# Patient Record
Sex: Male | Born: 1970 | Race: White | Hispanic: No | Marital: Married | Smoking: Never smoker
Health system: Southern US, Community
[De-identification: ages and names within clinical notes are randomized; demographics above are authoritative.]

## PROBLEM LIST (undated history)

## (undated) DIAGNOSIS — J45909 Unspecified asthma, uncomplicated: Secondary | ICD-10-CM

## (undated) DIAGNOSIS — J309 Allergic rhinitis, unspecified: Secondary | ICD-10-CM

## (undated) DIAGNOSIS — N529 Male erectile dysfunction, unspecified: Secondary | ICD-10-CM

## (undated) DIAGNOSIS — B351 Tinea unguium: Secondary | ICD-10-CM

## (undated) DIAGNOSIS — K589 Irritable bowel syndrome without diarrhea: Secondary | ICD-10-CM

## (undated) HISTORY — DX: Allergic rhinitis, unspecified: J30.9

## (undated) HISTORY — PX: SHOULDER SURGERY: SHX246

## (undated) HISTORY — DX: Male erectile dysfunction, unspecified: N52.9

## (undated) HISTORY — PX: TONSILLECTOMY: SUR1361

## (undated) HISTORY — PX: VASECTOMY: SHX75

## (undated) HISTORY — PX: COLONOSCOPY: SHX174

## (undated) HISTORY — DX: Irritable bowel syndrome, unspecified: K58.9

## (undated) HISTORY — DX: Unspecified asthma, uncomplicated: J45.909

## (undated) HISTORY — DX: Tinea unguium: B35.1

## (undated) HISTORY — PX: SEPTOPLASTY: SHX2393

---

## 2011-10-26 ENCOUNTER — Encounter: Payer: Self-pay | Admitting: Family Medicine

## 2011-10-26 ENCOUNTER — Ambulatory Visit (INDEPENDENT_AMBULATORY_CARE_PROVIDER_SITE_OTHER): Payer: 59 | Admitting: Family Medicine

## 2011-10-26 VITALS — BP 110/70 | HR 56 | Ht 70.0 in | Wt 177.0 lb

## 2011-10-26 DIAGNOSIS — B351 Tinea unguium: Secondary | ICD-10-CM | POA: Insufficient documentation

## 2011-10-26 DIAGNOSIS — Z79899 Other long term (current) drug therapy: Secondary | ICD-10-CM

## 2011-10-26 LAB — HEPATIC FUNCTION PANEL
ALT: 14 U/L (ref 0–53)
AST: 18 U/L (ref 0–37)
Alkaline Phosphatase: 54 U/L (ref 39–117)
Bilirubin, Direct: 0.1 mg/dL (ref 0.0–0.3)
Indirect Bilirubin: 0.6 mg/dL (ref 0.0–0.9)

## 2011-10-26 MED ORDER — TERBINAFINE HCL 250 MG PO TABS: 250.0000 mg | ORAL_TABLET | Freq: Every day | ORAL | Status: DC

## 2011-10-26 NOTE — Patient Instructions (Addendum)
If medication is approved, take it daily for three months. Remember it may take a full year for the great toenail to grow back in normally (other nails take less time).  If we need a fungal culture to get insurance to authorize the coverage, then let the toenails grow out and return for a clipping.  Do not start the medication until you hear that your liver tests are normal.  With each refill of the medication, you need to return for liver tests

## 2011-10-26 NOTE — Progress Notes (Signed)
Chief complaint:  Nail Problem-- L great toe x 6-8 months also starting to notice on all of his other toes as well  Over 6 months ago, noticed that the toenails looked very white, thickened. First noticed on his left foot, but now is spreading to the right.  Left great toe is the worst.  Took Lamisil about 5 years ago for a similar problem, and nails improved completely.  Seemed to start getting worse again over the last 6 months.  Requesting re-treatment.  He denies any pain, ingrowing nails, or other concerns  Past Medical History  Diagnosis Date  . Allergic rhinitis, cause unspecified     Past Surgical History  Procedure Date  . Appendectomy age 24  . Shoulder surgery     right    History   Social History  . Marital Status: Married    Spouse Name: N/A    Number of Children: 3  . Years of Education: N/A   Occupational History  . Corporate treasurer at [employer]    Social History Main Topics  . Smoking status: Never Smoker   . Smokeless tobacco: Never Used  . Alcohol Use: Yes     2-4 drinks per week.  . Drug Use: No  . Sexually Active: Not on file   Other Topics Concern  . Not on file   Social History Narrative   Lives with wife, 3 kids, 1 dog.  He is a runner.  Moved here from [facility]    Family History  Problem Relation Age of Onset  . Cancer Mother     lung cancer, smoker  . Alcohol abuse Mother   . Cancer Father     stomach cancer, smoker  . Alcohol abuse Father   . Asthma Daughter    Current outpatient prescriptions:ciclesonide (ALVESCO) 160 MCG/ACT inhaler, Inhale 1 puff into the lungs 2 (two) times daily.  , Disp: , Rfl: ;  mometasone (NASONEX) 50 MCG/ACT nasal spray, Place 2 sprays into the nose 2 (two) times daily.  , Disp: , Rfl: ;  montelukast (SINGULAIR) 10 MG tablet, Take 10 mg by mouth at bedtime.  , Disp: , Rfl:  terbinafine (LAMISIL) 250 MG tablet, Take 1 tablet (250 mg total) by mouth daily., Disp: 30 tablet, Rfl: 2  Allergies    Allergen Reactions  . Aspirin Other (See Comments)    Not actual allergy, had chronic nosebleeds as a child and was instructed not to use aspirin.   ROS:  Denies fevers, URI symptoms.  Recent flu, lots of problems with allergies in [place]--he is back on meds, doing well.  Denies cough, shortness of breath, GI complaints, joint pains, skin concerns or other problems  PHYSICAL EXAM: BP 110/70  Pulse 56  Ht 5\' 10"  (1.778 m)  Wt 177 lb (80.287 kg)  BMI 25.40 kg/m2 Well developed, pleasant male in no distress Neck: no lymphadenopathy, thyromegaly or mass Heart: bradycardic, regular rhythm, no murmurs, rubs or gallops Lungs: clear bilaterally Abdomen: soft, nontender, no organomegaly or mass Extremities: 2+ pulses, no edema.  Toenails: White discoloration to L 1,4,5 toenails, with the great toenail slightly thickened as well.  White discoloration also to R 3,4,5  Toenails.  No evidence of ingrowing nails or paronychia Skin: no rashes Psych: normal mood, affect, hygiene and grooming  ASSESSMENT/PLAN: 1. Onychomycosis  terbinafine (LAMISIL) 250 MG tablet  2. Encounter for long-term (current) use of other medications  Hepatic function panel, Hepatic function panel, Hepatic function panel  Risks and expected course of treatment with antifungals reviewed.  He would like re-treatment.  Baseline LFT's today, then repeat monthly while on therapy.  Treat x 3 months total with antifungals, and pt is aware that it may take a year for great toenail to grow in normal.  We discussed the possibility of issues with insurance coverage. Nails were too short to clip for fungal culture today.  If it is required by insurance, he will need to let nail grow a little longer, then come for clipping for fungal culture (only if required for prior auth for Lamisil, otherwise just treat without culture)  Allergies--he isn't under the care of an allergist here.  He has had skin testing and has multiple allergies.  If he is  well controlled on meds, I'm happy to rx his current meds for him.  If not well controlled, then he should see allergist for treatment, to consider allergy shots

## 2011-10-27 ENCOUNTER — Telehealth: Payer: Self-pay | Admitting: Family Medicine

## 2011-10-27 NOTE — Telephone Encounter (Signed)
DONE

## 2011-11-10 ENCOUNTER — Other Ambulatory Visit: Payer: Self-pay | Admitting: Allergy

## 2011-11-10 ENCOUNTER — Ambulatory Visit
Admission: RE | Admit: 2011-11-10 | Discharge: 2011-11-10 | Disposition: A | Discharge status: Home / Self Care | Payer: 59 | Source: Ambulatory Visit | Attending: Allergy | Admitting: Allergy

## 2011-11-10 DIAGNOSIS — J45909 Unspecified asthma, uncomplicated: Secondary | ICD-10-CM

## 2011-11-27 ENCOUNTER — Other Ambulatory Visit: Payer: 59

## 2011-11-27 DIAGNOSIS — Z79899 Other long term (current) drug therapy: Secondary | ICD-10-CM

## 2011-11-28 LAB — HEPATIC FUNCTION PANEL
ALT: 16 U/L (ref 0–53)
AST: 21 U/L (ref 0–37)
Bilirubin, Direct: 0.1 mg/dL (ref 0.0–0.3)
Total Protein: 6.9 g/dL (ref 6.0–8.3)

## 2011-11-29 ENCOUNTER — Other Ambulatory Visit: Payer: Self-pay | Admitting: *Deleted

## 2011-11-29 DIAGNOSIS — Z79899 Other long term (current) drug therapy: Secondary | ICD-10-CM

## 2011-12-29 ENCOUNTER — Other Ambulatory Visit: Payer: 59

## 2011-12-29 DIAGNOSIS — Z79899 Other long term (current) drug therapy: Secondary | ICD-10-CM

## 2011-12-30 LAB — HEPATIC FUNCTION PANEL
ALT: 29 U/L (ref 0–53)
AST: 37 U/L (ref 0–37)
Albumin: 4.4 g/dL (ref 3.5–5.2)
Total Protein: 6.7 g/dL (ref 6.0–8.3)

## 2011-12-31 ENCOUNTER — Encounter: Payer: Self-pay | Admitting: Family Medicine

## 2012-09-12 ENCOUNTER — Other Ambulatory Visit (INDEPENDENT_AMBULATORY_CARE_PROVIDER_SITE_OTHER): Payer: 59

## 2012-09-12 DIAGNOSIS — Z23 Encounter for immunization: Secondary | ICD-10-CM

## 2012-09-25 ENCOUNTER — Encounter: Payer: Self-pay | Admitting: Family Medicine

## 2012-09-25 ENCOUNTER — Ambulatory Visit (INDEPENDENT_AMBULATORY_CARE_PROVIDER_SITE_OTHER): Payer: 59 | Admitting: Family Medicine

## 2012-09-25 VITALS — BP 110/72 | HR 52 | Temp 98.3°F | Ht 70.0 in | Wt 186.0 lb

## 2012-09-25 DIAGNOSIS — H9209 Otalgia, unspecified ear: Secondary | ICD-10-CM

## 2012-09-25 NOTE — Progress Notes (Signed)
Chief Complaint  Patient presents with  . Otalgia    left ear x a couple days-pain has subsided. Last month felt plugged.  Feels plugged now and does have a sharp pain occasionally.   Last month ear felt plugged, but symptoms eventually resolved.  Sunday, while doing the [employer]ud[employer]--had acute onset of sharp pain in left ear after jumping into cold muddy water from a height of 20 feet (into 15 ft deep water, didn't touch bottom).  Felt very nauseated, ear felt plugged, throbbing, pain radiated to his left jaw/chin area for the rest of the night. He had some tinnitus and nausea at that time. He was able to continue and complete the event.  That night, he used a heating pad, Aleve D and ibuprofen after getting back home.  Pain is improved, but still plugged.  Occasional sharp pains.   Denies any ear drainage, fevers, and some decrease in hearing.  Feels plugged, but unable to pop the ear.  Past Medical History  Diagnosis Date  . Allergic rhinitis, cause unspecified    Past Surgical History  Procedure Date  . Appendectomy age 1  . Shoulder surgery     right   History   Social History  . Marital Status: Married    Spouse Name: N/A    Number of Children: 3  . Years of Education: N/A   Occupational History  . Corporate treasurer at [employer]    Social History Main Topics  . Smoking status: Never Smoker   . Smokeless tobacco: Never Used  . Alcohol Use: Yes     Comment: 2-4 drinks per week.  . Drug Use: No  . Sexually Active: Not on file   Other Topics Concern  . Not on file   Social History Narrative   Lives with wife, 3 kids, 1 dog.  He is a runner.  Moved here from [facility]   Current outpatient prescriptions:budesonide-formoterol (SYMBICORT) 160-4.5 MCG/ACT inhaler, Inhale 2 puffs into the lungs 2 (two) times daily., Disp: , Rfl: ;  mometasone (NASONEX) 50 MCG/ACT nasal spray, Place 2 sprays into the nose 2 (two) times daily.  , Disp: , Rfl: ;  montelukast  (SINGULAIR) 10 MG tablet, Take 10 mg by mouth at bedtime.  , Disp: , Rfl: ;  ALBUTEROL SULFATE IN, Inhale 1-2 puffs into the lungs as needed., Disp: , Rfl:   Allergies  Allergen Reactions  . Aspirin Other (See Comments)    Not actual allergy, had chronic nosebleeds as a child and was instructed not to use aspirin.   ROS:  Denies fevers.  +minimal congestion from allergies.  No sore throat, cough, shortness of breath, vertigo or other dizziness.  Denies nausea, vomiting, diarrhea, or other concerns.  PHYSICAL EXAM: BP 110/72  Pulse 52  Temp 98.3 F (36.8 C) (Oral)  Ht 5\' 10"  (1.778 m)  Wt 186 lb (84.369 kg)  BMI 26.69 kg/m2 Well developed, pleasant male in no distress HEENT:  PERRL, EOMI, conjunctiva clear.  TM's and EAC's normal.  Sinuses nontender,  OP clear. Neck: no lymphadenopathy, thyromegaly or carotid bruit Heart: reglar rate and rhythm without murmur Lungs: clear bilaterally Abdomen: soft, nontender, no organomegaly or mass. Skin: no rashes/lesions.   ASSESSMENT/PLAN: 1. Otalgia    left ear.  likely related to barotrauma.  supportive measures reviewed.  expect gradual continued resolution.     Reassured no evidence of acute infection.  If has ongoing symtpoms of pain, decreased hearing, then will need referral  to ENT for full evaluation.

## 2012-09-25 NOTE — Patient Instructions (Signed)
Ear Barotrauma   Ear barotrauma is ear pain or damage caused by a pressure difference between the inside and outside of the eardrum.    CAUSES     Being too close to an explosion or blast.   Receiving a blow to your ear.   Coming to the surface too rapidly after scuba diving.   Flying in an airplane.   Going to high altitudes within 24 hours after diving.   Undergoing rapid depressurization after working in a pressurized chamber.  SYMPTOMS     Dizziness that feels like a spinning, rocking, or tumbling sensation.   Loss of balance.   Nausea.   Ringing in your ear(s) (tinnitus).   Ear pain.   Bleeding from the ear(s).   Sudden partial or complete loss of hearing.   Headache.  DIAGNOSIS   Your caregiver may know what is wrong based on your history. Your caregiver may also use blood work, X-rays, and other tests to make sure that other injuries did not happen. This may be more important if your injury happened while scuba diving or if you traveled to high altitude after diving.  TREATMENT    Treatment depends on how bad the injury to your ear(s) is. For example, if there is a risk for infection, antibiotic medicine may be recommended. Your caregiver will make these decisions based on what was found during your exam. With severe injuries, you may need to see a specialist.  HOME CARE INSTRUCTIONS     Do not travel to high altitudes, work in a pressurized cabin or room, or scuba dive until your caregiver approves.   Keep your ears dry. Use the corner of a towel to get water out of the ears after showering or bathing.   Only take over-the-counter or prescription medicines for pain, discomfort, or fever as directed by your caregiver.   See your caregiver as recommended.  SEEK MEDICAL CARE IF:     You have pain that is not relieved by medicine or is getting worse.   You have a fever.   You have any new or unusual discharge from the ear.    The outer ear becomes red or swollen or there is swelling behind your earlobe.   You have problems that may be related to the medicine you are taking.  MAKE SURE YOU:     Understand these instructions.   Will watch your condition.   Will get help right away if you are not doing well or get worse.  Document Released: 10/09/2006 Document Revised: 01/29/2012 Document Reviewed: 10/25/2007  Exit[employer] Patient Information 2013 [employer].

## 2013-05-16 ENCOUNTER — Encounter: Payer: Self-pay | Admitting: Medical

## 2013-05-16 ENCOUNTER — Ambulatory Visit (INDEPENDENT_AMBULATORY_CARE_PROVIDER_SITE_OTHER): Payer: 59 | Admitting: Medical

## 2013-05-16 VITALS — BP 112/70 | HR 80 | Temp 99.4°F | Resp 18 | Wt 195.0 lb

## 2013-05-16 DIAGNOSIS — H9202 Otalgia, left ear: Secondary | ICD-10-CM

## 2013-05-16 DIAGNOSIS — J069 Acute upper respiratory infection, unspecified: Secondary | ICD-10-CM

## 2013-05-16 DIAGNOSIS — J45909 Unspecified asthma, uncomplicated: Secondary | ICD-10-CM

## 2013-05-16 DIAGNOSIS — H9209 Otalgia, unspecified ear: Secondary | ICD-10-CM

## 2013-05-16 MED ORDER — AMOXICILLIN 875 MG PO TABS: 875.0000 mg | ORAL_TABLET | Freq: Two times a day (BID) | ORAL | Status: DC

## 2013-05-16 MED ORDER — ALBUTEROL SULFATE HFA 108 (90 BASE) MCG/ACT IN AERS: 2.0000 | INHALATION_SPRAY | Freq: Four times a day (QID) | RESPIRATORY_TRACT | Status: DC | PRN

## 2013-05-16 NOTE — Progress Notes (Signed)
Subjective:  Seth Kerr is a 42 y.o. male who presents for illness x 5 days.   Started with sore throat, but has progressively felt worse. Has cough, tight chest, ear pain, nose clogged.  He notes subjective fever, achy.  Denies NVD.  Uses Symbiotic, 2 puffs BID, hasn't used albuterol in months, hasn't needed it.  Treatment to date: cough suppressants, old left over Hydrocodone/Hycodan.  Daughter has had some cough.  No other aggravating or relieving factors. He is rarely sick, rarely has to use his inhaler.  Inhaler is out of date, request refill. No other c/o.  The following portions of the patient's history were reviewed and updated as appropriate: allergies, current medications, past family history, past medical history, past social history, past surgical history and problem list.  Past Medical History  Diagnosis Date  . Allergic rhinitis, cause unspecified   . Asthma    ROS as in subjective  Objective: Vital signs reviewed  General appearance: Alert, WD/WN, no distress, ill appearing                             Skin: warm, no rash, no diaphoresis                           Head: no sinus tenderness                            Eyes: conjunctiva normal, corneas clear, PERRLA                            Ears: left TM with slight erythema, serous effusion, left TM pearly, external ear canals normal                          Nose: septum midline, turbinates swollen, with erythema and clear discharge             Mouth/throat: MMM, tongue normal, mild pharyngeal erythema                           Neck: supple, no adenopathy, no thyromegaly, nontender                          Heart: RRR, normal S1, S2, no murmurs                         Lungs: clear, no wheezes, no rales                Extremities: no edema, nontender     Assessment and Plan: Encounter Diagnoses  Name Primary?  Marland Kitchen Upper respiratory infection Yes  . Unspecified asthma(493.90)   . Otalgia of left ear    Currently this may  just be viral URI, but left ear with effusion, slight erythema.   Discussed supportive care, but if worsening in next 1-2 days with fever, worse symptoms, worse ear pain, then begin Amoxicillin sent.  Refilled albuterol.  Call/return in 2-3 days if symptoms are worse or not improving.

## 2013-10-09 ENCOUNTER — Other Ambulatory Visit (INDEPENDENT_AMBULATORY_CARE_PROVIDER_SITE_OTHER): Payer: 59

## 2013-10-09 DIAGNOSIS — Z23 Encounter for immunization: Secondary | ICD-10-CM

## 2013-11-25 ENCOUNTER — Ambulatory Visit: Payer: 59 | Admitting: Family Medicine

## 2013-12-08 ENCOUNTER — Encounter: Payer: Self-pay | Admitting: Family Medicine

## 2013-12-08 ENCOUNTER — Ambulatory Visit (INDEPENDENT_AMBULATORY_CARE_PROVIDER_SITE_OTHER): Payer: 59 | Admitting: Family Medicine

## 2013-12-08 VITALS — BP 120/78 | HR 68 | Ht 70.0 in | Wt 196.0 lb

## 2013-12-08 DIAGNOSIS — Z79899 Other long term (current) drug therapy: Secondary | ICD-10-CM

## 2013-12-08 DIAGNOSIS — B351 Tinea unguium: Secondary | ICD-10-CM

## 2013-12-08 DIAGNOSIS — N529 Male erectile dysfunction, unspecified: Secondary | ICD-10-CM

## 2013-12-08 MED ORDER — TERBINAFINE HCL 250 MG PO TABS: 250.0000 mg | ORAL_TABLET | Freq: Every day | ORAL | Status: DC

## 2013-12-08 MED ORDER — SILDENAFIL CITRATE 100 MG PO TABS: 50.0000 mg | ORAL_TABLET | Freq: Every day | ORAL | Status: DC | PRN

## 2013-12-08 NOTE — Patient Instructions (Signed)

## 2013-12-08 NOTE — Progress Notes (Signed)
Chief Complaint  Patient presents with  . Nail Problem    seen in 10-2011 by Dr.Chosen Geske for this, did get better. Has some toemail fungus on both feet, several nails would like to possibly restart Lamisil. Also has a rash in his groin area x 1 month. Did try mometasone ointment given by Dr.Van WInkle(allergist) not working at all.    Patient presents requesting another course of treatment for onychomycosis.  He was treated for this in 10/2011-- with lamisil x 3 months and reports that the infection completely resolved.  He started noticing recurrence of infection in the left 4th toe in the early part of summer 2014, and has since continued to spread to other toes.  Denies any ingrowing nails or pain.  Rash in bilateral groin for about a month.  He sometimes would get this when he starts running, but he has been consistently running and it isn't getting better.  He has been using steroid cream without benefit.  Rash is at groin bilaterally, and it very itchy.  He thinks he has a spider bite on his left thigh.    ED--used Viagra in past.  Having some intermittent problems maintaining erection.  Asking for prescription.  Had prescription for years, but only recently having more problems  20 pound weight gain in 2 years.  Eating better, cut out alcohol since the beginning of the year.  He thinks some of the weight gain has been related to his schedule, harder to run outside in his dark neighborhood with [place] compared to when he lived in [place].  Past Medical History  Diagnosis Date  . Allergic rhinitis, cause unspecified   . Asthma   . Erectile dysfunction   . Onychomycosis    Past Surgical History  Procedure Laterality Date  . Appendectomy  age 43  . Shoulder surgery      right   History   Social History  . Marital Status: Married    Spouse Name: N/A    Number of Children: 3  . Years of Education: N/A   Occupational History  . Curator at [employer]  . Smoking status: Never Smoker   . Smokeless tobacco: Never Used  . Alcohol Use: Yes     Comment: 2-4 drinks per week. No alcohol since 11/20/13  . Drug Use: No  . Sexual Activity: Not on file   Other Topics Concern  . Not on file   Social History Narrative   Lives with wife, 3 kids, 1 dog.  He is a runner.  Moved here from [facility].  Curator, works 12 hour shifts.    Current outpatient prescriptions:budesonide-formoterol (SYMBICORT) 160-4.5 MCG/ACT inhaler, Inhale 2 puffs into the lungs 2 (two) times daily., Disp: , Rfl: ;  levocetirizine (XYZAL) 5 MG tablet, Take 5 mg by mouth every evening., Disp: , Rfl: ;  mometasone (ELOCON) 0.1 % ointment, Apply 1 application topically daily., Disp: , Rfl: ;  montelukast (SINGULAIR) 10 MG tablet, Take 10 mg by mouth at bedtime.  , Disp: , Rfl:  albuterol (PROVENTIL HFA;VENTOLIN HFA) 108 (90 BASE) MCG/ACT inhaler, Inhale 2 puffs into the lungs every 6 (six) hours as needed for wheezing., Disp: 1 Inhaler, Rfl: 0;  mometasone (NASONEX) 50 MCG/ACT nasal spray, Place 2 sprays into the nose 2 (two) times daily.  , Disp: , Rfl:   Allergies  Allergen Reactions  . Aspirin Other (See Comments)    Not actual  allergy, had chronic nosebleeds as a child and was instructed not to use aspirin.   ROS:  Denies fevers, chills, URI symptoms.  His allergies/asthma is well controlled, he is under the care of Dr. Donneta Romberg.  No chest pain, shortness of breath.  No GI complaints.  No other skin lesions except as per HPI.  No chest pain or other problems.  PHYSICAL EXAM: BP 120/78  Pulse 68  Ht 5\' 10"  (1.778 m)  Wt 196 lb (88.905 kg)  BMI 28.12 kg/m2 Well developed, pleasant male in no distress  Bilateral groin--erythema, somewhat patchy and diffuse.  Not macerated, no satellite lesions, but one area appeared mildly serpiginous.   1 scab on thigh, without evidence of abscess Toenails: 2nd through 5th toes bilaterally are discolored, the L  4th toenail is completely white 2+ pulses.  No maceration between toes  Neuro: alert and oriented. Normal sensation, strength, gait Psych: normal mood, affect, hygiene and grooming  ASSESSMENT/PLAN:   Onychomycosis - Plan: terbinafine (LAMISIL) 250 MG tablet, Hepatic function panel  Encounter for long-term (current) use of other medications - Plan: Hepatic function panel  Erectile dysfunction - Plan: sildenafil (VIAGRA) 100 MG tablet  Risks/benefits and side effects of meds reviewed. Rash in groin--cannot rule out tinea cruris (exam affected by partial treatment with steroids), but this would be covered with the lamisil for his toenails, so no additional topical treatment is needed.  He can continue to use the steroid cream as needed for itching (while on lamisil), and educated re: measures to keep the skin dry (loose fitting clothing, cotton underwear, dry completely, powder, etc.)  Return in 6 weeks for LFT's. Schedule CPE

## 2014-01-19 ENCOUNTER — Telehealth: Payer: Self-pay | Admitting: Family Medicine

## 2014-01-19 ENCOUNTER — Other Ambulatory Visit: Payer: 59

## 2014-01-19 NOTE — Telephone Encounter (Signed)
Left message for pt to call. Pt needs a lab visit missed his last one.

## 2014-01-28 ENCOUNTER — Other Ambulatory Visit: Payer: 59

## 2014-01-28 DIAGNOSIS — Z79899 Other long term (current) drug therapy: Secondary | ICD-10-CM

## 2014-01-28 DIAGNOSIS — B351 Tinea unguium: Secondary | ICD-10-CM

## 2014-01-28 LAB — HEPATIC FUNCTION PANEL
ALT: 19 U/L (ref 0–53)
AST: 21 U/L (ref 0–37)
Albumin: 4 g/dL (ref 3.5–5.2)
Alkaline Phosphatase: 43 U/L (ref 39–117)
BILIRUBIN DIRECT: 0.2 mg/dL (ref 0.0–0.3)
Indirect Bilirubin: 0.6 mg/dL (ref 0.2–1.2)
TOTAL PROTEIN: 6.5 g/dL (ref 6.0–8.3)
Total Bilirubin: 0.8 mg/dL (ref 0.2–1.2)

## 2014-04-22 ENCOUNTER — Encounter: Payer: Self-pay | Admitting: Family Medicine

## 2014-04-22 ENCOUNTER — Other Ambulatory Visit: Payer: Self-pay | Admitting: Allergy

## 2014-04-22 ENCOUNTER — Ambulatory Visit (INDEPENDENT_AMBULATORY_CARE_PROVIDER_SITE_OTHER): Payer: 59 | Admitting: Family Medicine

## 2014-04-22 ENCOUNTER — Ambulatory Visit
Admission: RE | Admit: 2014-04-22 | Discharge: 2014-04-22 | Disposition: A | Discharge status: Home / Self Care | Payer: 59 | Source: Ambulatory Visit | Attending: Allergy | Admitting: Allergy

## 2014-04-22 VITALS — BP 112/78 | HR 52 | Ht 70.0 in | Wt 190.0 lb

## 2014-04-22 DIAGNOSIS — J45901 Unspecified asthma with (acute) exacerbation: Secondary | ICD-10-CM

## 2014-04-22 DIAGNOSIS — R5381 Other malaise: Secondary | ICD-10-CM

## 2014-04-22 DIAGNOSIS — Z23 Encounter for immunization: Secondary | ICD-10-CM

## 2014-04-22 DIAGNOSIS — Z13220 Encounter for screening for lipoid disorders: Secondary | ICD-10-CM

## 2014-04-22 DIAGNOSIS — J309 Allergic rhinitis, unspecified: Secondary | ICD-10-CM

## 2014-04-22 DIAGNOSIS — L84 Corns and callosities: Secondary | ICD-10-CM

## 2014-04-22 DIAGNOSIS — R5383 Other fatigue: Secondary | ICD-10-CM

## 2014-04-22 DIAGNOSIS — J45909 Unspecified asthma, uncomplicated: Secondary | ICD-10-CM

## 2014-04-22 DIAGNOSIS — Z0000 Encounter for general adult medical examination without abnormal findings: Secondary | ICD-10-CM

## 2014-04-22 LAB — POCT URINALYSIS DIPSTICK
Bilirubin, UA: NEGATIVE
Blood, UA: NEGATIVE
Glucose, UA: NEGATIVE
KETONES UA: NEGATIVE
Leukocytes, UA: NEGATIVE
Nitrite, UA: NEGATIVE
PH UA: 5
PROTEIN UA: NEGATIVE
SPEC GRAV UA: 1.01
UROBILINOGEN UA: NEGATIVE

## 2014-04-22 NOTE — Progress Notes (Signed)
Chief Complaint  Patient presents with  . Annual Exam    nonfasting annual exam. Would like to follow up on toenail fungus-completed lamisil but does have a place on the bottom of his foot he would like for you to look at. No other concerns.    Seth Kerr is a 43 y.o. male who presents for a complete physical.  He has the following concerns:  He notices a lump on the bottom of his right foot, slowly developing since March, and now it has become painful.  He thinks it started after being more active after getting a Fitbit. He hasn't actually been able to run much recently, due to schedule change at work.  He saw Dr. Donneta Romberg this morning, who gave him a steroid shot due to his complaints of increasing cough, and decreased peak flow.  He is also going for a chest x-ray today.  Denies fevers or chills, no productive cough.  He is getting weekly allergy shots, and allergies are controlled with his current regimen.  Onychomycosis:  He completed the course of lamisil without adverse effects.  He notices that the discoloration seems to be growing out, normal nail color for new growth.   Immunization History  Administered Date(s) Administered  . Influenza Split 09/12/2012  . Influenza,inj,Quad PF,36+ Mos 10/09/2013   Last colonoscopy: done at approx age [age], and another one age 44.  Reportedly normal.  Diagnosed with IBS Last PSA: reports having had one done at age 11 (through work; given choice for blood test or exam, but ultimately ended up having prostate exam for some urinary complaints) Dentist: twice yearly Ophtho: yearly Exercise:  Has been less since some change in job schedule (12 hrs shifts are changing from days to nights; going back to straight days in August).  Runs, and weights at the gym (2-4x/wk)  Past Medical History  Diagnosis Date  . Allergic rhinitis, cause unspecified     on immunotherapy, per Dr. Donneta Romberg  . Asthma   . Erectile dysfunction   . Onychomycosis     s/p  lamisil x 2 courses  . Irritable bowel syndrome (IBS)     has had 2 colonoscopies    Past Surgical History  Procedure Laterality Date  . Appendectomy  age 29  . Shoulder surgery Right   . Septoplasty  approx age 32    History   Social History  . Marital Status: Married    Spouse Name: N/A    Number of Children: 3  . Years of Education: N/A   Occupational History  . Curator at [employer]  . Smoking status: Never Smoker   . Smokeless tobacco: Never Used  . Alcohol Use: Yes     Comment: 4-5 drinks//week (no more than 2 drinks in a night)  . Drug Use: No  . Sexual Activity: Yes    Partners: Female   Other Topics Concern  . Not on file   Social History Narrative   Lives with wife, 3 kids, 1 dog.  He is a runner.  Moved here from [facility].  Curator, works 12 hour shifts.     Family History  Problem Relation Age of Onset  . Cancer Mother     lung cancer, smoker  . Alcohol abuse Mother   . Cancer Father     stomach cancer, smoker  . Alcohol abuse Father   . Asthma Daughter   . Diabetes Neg Hx  Outpatient Encounter Prescriptions as of 04/22/2014  Medication Sig Note  . levocetirizine (XYZAL) 5 MG tablet Take 5 mg by mouth every evening.   . mometasone (ELOCON) 0.1 % ointment Apply 1 application topically daily.   . mometasone (NASONEX) 50 MCG/ACT nasal spray Place 2 sprays into the nose 2 (two) times daily.     . mometasone-formoterol (DULERA) 100-5 MCG/ACT AERO Inhale 2 puffs into the lungs 2 (two) times daily.   . montelukast (SINGULAIR) 10 MG tablet Take 10 mg by mouth at bedtime.     Marland Kitchen albuterol (PROVENTIL HFA;VENTOLIN HFA) 108 (90 BASE) MCG/ACT inhaler Inhale 2 puffs into the lungs every 6 (six) hours as needed for wheezing. 04/22/2014: Uses prn, hasn't been using  . sildenafil (VIAGRA) 100 MG tablet Take 0.5-1 tablets (50-100 mg total) by mouth daily as needed for erectile dysfunction. 04/22/2014: Uses  sporadically  . [DISCONTINUED] budesonide-formoterol (SYMBICORT) 160-4.5 MCG/ACT inhaler Inhale 2 puffs into the lungs 2 (two) times daily.   . [DISCONTINUED] terbinafine (LAMISIL) 250 MG tablet Take 1 tablet (250 mg total) by mouth daily.    Allergies  Allergen Reactions  . Aspirin Other (See Comments)    Not actual allergy, had chronic nosebleeds as a child and was instructed not to use aspirin.   ROS:  The patient denies anorexia, fever, weight changes, headaches,  vision loss, decreased hearing, ear pain, hoarseness, chest pain, palpitations, dizziness, syncope, dyspnea on exertion, cough, swelling, nausea, vomiting, diarrhea, constipation, abdominal pain, melena, hematochezia, indigestion/heartburn, hematuria, incontinence, nocturia, weakened urine stream, dysuria, genital lesions, joint pains, numbness, tingling, weakness, tremor, suspicious skin lesions, depression, anxiety, abnormal bleeding/bruising, or enlarged lymph nodes He complains of some fatigue, feeling less energy than in the past.  ED intermittent/mild Cough. Painful lump on right foot  PHYSICAL EXAM: BP 112/78  Pulse 52  Ht 5\' 10"  (1.778 m)  Wt 190 lb (86.183 kg)  BMI 27.26 kg/m2  General Appearance:    Alert, cooperative, no distress, appears stated age  Head:    Normocephalic, without obvious abnormality, atraumatic  Eyes:    PERRL, conjunctiva/corneas clear, EOM's intact, fundi    benign  Ears:    Normal TM's and external ear canals  Nose:   Nares normal, mucosa is mod edematous, clear drainage on right; normal on left. Sinuses nontender  Throat:   Lips, mucosa, and tongue normal; teeth and gums normal.  There is some erythema noted posteriorly in OP  Neck:   Supple, no lymphadenopathy;  thyroid:  no   enlargement/tenderness/nodules; no carotid   bruit or JVD  Back:    Spine nontender, no curvature, ROM normal, no CVA     tenderness  Lungs:     respirations unlabored, good air movement. No wheezing.  Some coarse  breath sounds initially that cleared.  Had some crackles that persisted at the R base  Chest Wall:    No tenderness or deformity   Heart:    Regular rate and rhythm, S1 and S2 normal, no murmur, rub   or gallop  Breast Exam:    No chest wall tenderness, masses or gynecomastia  Abdomen:     Soft, non-tender, nondistended, normoactive bowel sounds,    no masses, no hepatosplenomegaly  Genitalia:    Normal male external genitalia without lesions.  Testicles without masses.  No inguinal hernias.  Rectal:    Normal sphincter tone, no masses or tenderness; guaiac negative stool.  Prostate smooth, no nodules, not enlarged.  Extremities:   No clubbing, cyanosis  or edema. Bottom of R foot, at base of 4th toe there is a corn (hard core is palpable, tender).  There is also some callous formation.  Pulses:   2+ and symmetric all extremities  Skin:   Skin color, texture, turgor normal, no rashes or lesions.  There is at least 54mm of normal nail growth at base of infected toenails  Lymph nodes:   Cervical, supraclavicular, and axillary nodes normal  Neurologic:   CNII-XII intact, normal strength, sensation and gait; reflexes 2+ and symmetric throughout          Psych:   Normal mood, affect, hygiene and grooming.     ASSESSMENT/PLAN:  Routine general medical examination at a health care facility - Plan: Visual acuity screening, Urinalysis Dipstick, CBC with Differential, Basic metabolic panel, TSH, Vit D  25 hydroxy (rtn osteoporosis monitoring), Lipid panel  Screening for lipoid disorders - Plan: Lipid panel  Other malaise and fatigue - Plan: CBC with Differential, Basic metabolic panel, TSH, Vit D  25 hydroxy (rtn osteoporosis monitoring)  Need for Tdap vaccination - Plan: Tdap vaccine greater than or equal to 7yo IM  Asthma - controlled on current regimen.  Some increased cough and crackles R base--agree with recommendation for CXR today  Allergic rhinitis, cause unspecified - suboptimally  controlled per exam today.  Steroid shot given today will help, but recommend restarting nasal steroids  Corn of foot - discussed pads, OTC meds.  return here (or podiatrist) if ongoing pain/rproblems  OTC corn treatments.  Return here for additional treatment if not improving, vs podiatrist.  Onychomycosis--responding to treatment; nail growing in normally.  return fasting for labs

## 2014-04-22 NOTE — Patient Instructions (Addendum)
HEALTH MAINTENANCE RECOMMENDATIONS:  It is recommended that you get at least 30 minutes of aerobic exercise at least 5 days/week (for weight loss, you may need as much as 60-90 minutes). This can be any activity that gets your heart rate up. This can be divided in 10-15 minute intervals if needed, but try and build up your endurance at least once a week.  Weight bearing exercise is also recommended twice weekly.  Eat a healthy diet with lots of vegetables, fruits and fiber.  "Colorful" foods have a lot of vitamins (ie green vegetables, tomatoes, red peppers, etc).  Limit sweet tea, regular sodas and alcoholic beverages, all of which has a lot of calories and sugar.  Up to 2 alcoholic drinks daily may be beneficial for men (unless trying to lose weight, watch sugars).  Drink a lot of water.  Sunscreen of at least SPF 30 should be used on all sun-exposed parts of the skin when outside between the hours of 10 am and 4 pm (not just when at beach or pool, but even with exercise, golf, tennis, and yard work!)  Use a sunscreen that says "broad spectrum" so it covers both UVA and UVB rays, and make sure to reapply every 1-2 hours.  Remember to change the batteries in your smoke detectors when changing your clock times in the spring and fall.  Use your seat belt every time you are in a car, and please drive safely and not be distracted with cell phones and texting while driving.  Corns and Calluses Corns are small areas of thickened skin that usually occur on the top, sides, or tip of a toe. They contain a cone-shaped core with a point that can press on a nerve below. This causes pain. Calluses are areas of thickened skin that usually develop on hands, fingers, palms, soles of the feet, and heels. These are areas that experience frequent friction or pressure. CAUSES  Corns are usually the result of rubbing (friction) or pressure from shoes that are too tight or do not fit properly. Calluses are caused by  repeated friction and pressure on the affected areas. SYMPTOMS  A hard growth on the skin.  Pain or tenderness under the skin.  Sometimes, redness and swelling.  Increased discomfort while wearing tight-fitting shoes. DIAGNOSIS  Your caregiver can usually tell what the problem is by doing a physical exam. TREATMENT  Removing the cause of the friction or pressure is usually the only treatment needed. However, sometimes medicines can be used to help soften the hardened, thickened areas. These medicines include salicylic acid plasters and 12% ammonium lactate lotion. These medicines should only be used under the direction of your caregiver. HOME CARE INSTRUCTIONS   Try to remove pressure from the affected area.  You may wear donut-shaped corn pads to protect your skin.  You may use a pumice stone or nonmetallic nail file to gently reduce the thickness of a corn.  Wear properly fitted footwear.  If you have calluses on the hands, wear gloves during activities that cause friction.  If you have diabetes, you should regularly examine your feet. Tell your caregiver if you notice any problems with your feet. SEEK IMMEDIATE MEDICAL CARE IF:   You have increased pain, swelling, redness, or warmth in the affected area.  Your corn or callus starts to drain fluid or bleeds.  You are not getting better, even with treatment. Document Released: 08/12/2004 Document Revised: 01/29/2012 Document Reviewed: 07/04/2011 [facility] Patient Information 2014 [place], [place].  Restart your nasal steroid spray

## 2014-04-23 ENCOUNTER — Other Ambulatory Visit: Payer: 59

## 2014-04-23 ENCOUNTER — Telehealth: Payer: Self-pay | Admitting: Family Medicine

## 2014-04-23 DIAGNOSIS — Z0000 Encounter for general adult medical examination without abnormal findings: Secondary | ICD-10-CM

## 2014-04-23 DIAGNOSIS — R5381 Other malaise: Secondary | ICD-10-CM

## 2014-04-23 DIAGNOSIS — Z13220 Encounter for screening for lipoid disorders: Secondary | ICD-10-CM

## 2014-04-23 DIAGNOSIS — R5383 Other fatigue: Secondary | ICD-10-CM

## 2014-04-23 LAB — CBC WITH DIFFERENTIAL/PLATELET
BASOS ABS: 0.1 10*3/uL (ref 0.0–0.1)
BASOS PCT: 1 % (ref 0–1)
EOS PCT: 23 % — AB (ref 0–5)
Eosinophils Absolute: 1.6 10*3/uL — ABNORMAL HIGH (ref 0.0–0.7)
HEMATOCRIT: 44.1 % (ref 39.0–52.0)
Hemoglobin: 15.4 g/dL (ref 13.0–17.0)
Lymphocytes Relative: 29 % (ref 12–46)
Lymphs Abs: 2 10*3/uL (ref 0.7–4.0)
MCH: 29.6 pg (ref 26.0–34.0)
MCHC: 34.9 g/dL (ref 30.0–36.0)
MCV: 84.8 fL (ref 78.0–100.0)
MONO ABS: 0.6 10*3/uL (ref 0.1–1.0)
Monocytes Relative: 9 % (ref 3–12)
Neutro Abs: 2.7 10*3/uL (ref 1.7–7.7)
Neutrophils Relative %: 38 % — ABNORMAL LOW (ref 43–77)
PLATELETS: 231 10*3/uL (ref 150–400)
RBC: 5.2 MIL/uL (ref 4.22–5.81)
RDW: 13.4 % (ref 11.5–15.5)
WBC: 7 10*3/uL (ref 4.0–10.5)

## 2014-04-23 LAB — BASIC METABOLIC PANEL
BUN: 19 mg/dL (ref 6–23)
CO2: 29 mEq/L (ref 19–32)
CREATININE: 0.91 mg/dL (ref 0.50–1.35)
Calcium: 9.3 mg/dL (ref 8.4–10.5)
Chloride: 103 mEq/L (ref 96–112)
Glucose, Bld: 85 mg/dL (ref 70–99)
Potassium: 4.4 mEq/L (ref 3.5–5.3)
Sodium: 137 mEq/L (ref 135–145)

## 2014-04-23 LAB — LIPID PANEL
Cholesterol: 197 mg/dL (ref 0–200)
HDL: 77 mg/dL (ref >39)
LDL Cholesterol: 112 mg/dL — ABNORMAL HIGH (ref 0–99)
Total CHOL/HDL Ratio: 2.6 Ratio
Triglycerides: 41 mg/dL (ref <150)
VLDL: 8 mg/dL (ref 0–40)

## 2014-04-23 LAB — TSH: TSH: 0.669 u[IU]/mL (ref 0.350–4.500)

## 2014-04-23 NOTE — Telephone Encounter (Signed)
I sent patient a MyChart message with detailed information (I didn't get Dr. Rush Landmark report yet, but I can see CXR results in computer).  Let him know to check computer for my interpretation.  Thanks

## 2014-04-23 NOTE — Telephone Encounter (Signed)
Called pt and inform him to review his MyChart message from Dr Tomi Bamberger

## 2014-04-24 LAB — VITAMIN D 25 HYDROXY (VIT D DEFICIENCY, FRACTURES): VIT D 25 HYDROXY: 45 ng/mL (ref 30–89)

## 2014-08-03 ENCOUNTER — Ambulatory Visit (INDEPENDENT_AMBULATORY_CARE_PROVIDER_SITE_OTHER): Payer: 59 | Admitting: Medical

## 2014-08-03 ENCOUNTER — Encounter: Payer: Self-pay | Admitting: Medical

## 2014-08-03 VITALS — BP 112/80 | HR 56 | Temp 98.6°F | Resp 14 | Wt 200.0 lb

## 2014-08-03 DIAGNOSIS — J45909 Unspecified asthma, uncomplicated: Secondary | ICD-10-CM

## 2014-08-03 DIAGNOSIS — J069 Acute upper respiratory infection, unspecified: Secondary | ICD-10-CM

## 2014-08-03 MED ORDER — PREDNISONE 20 MG PO TABS: ORAL_TABLET | ORAL | Status: DC

## 2014-08-03 NOTE — Progress Notes (Signed)
Subjective:  Seth Kerr is a 43 y.o. male who presents for illness.  Started feeling bad 3 days ago.   Started with sore throat, sneezing, coughing up green stuff, feels feverish without fever, head congestion, some chest congestion, mild nausea.  Taking mucinex.  Denies vomiting, diarrhea. No sick contacts.   He does not smoke.   He does have a history of asthma, using albuterol a little more than normal.   Has some wheezing, worse in morning.  Prior to illness, rarely having to use the inhaler.  Is compliant with his daily Dulera in general.   Hasn't been sleeping good in general due to shoulder injury at work.  No other aggravating or relieving factors.  No other c/o.  The following portions of the patient's history were reviewed and updated as appropriate: allergies, current medications, past family history, past medical history, past social history, past surgical history and problem list.  ROS as in subjective  Past Medical History  Diagnosis Date  . Allergic rhinitis, cause unspecified     on immunotherapy, per Dr. Donneta Romberg  . Asthma   . Erectile dysfunction   . Onychomycosis     s/p lamisil x 2 courses  . Irritable bowel syndrome (IBS)     has had 2 colonoscopies     Objective: BP 112/80  Pulse 56  Temp(Src) 98.6 F (37 C) (Oral)  Resp 14  Wt 200 lb (90.719 kg)  General appearance: Alert, WD/WN, no distress, mildly ill appearing                             Skin: warm, no rash, no diaphoresis                           Head: no sinus tenderness                            Eyes: conjunctiva normal, corneas clear, PERRLA                            Ears: pearly TMs, external ear canals normal                          Nose: septum midline, turbinates swollen, with erythema and mild mucoid discharge             Mouth/throat: MMM, tongue normal, mild pharyngeal erythema, post nasal drainage                           Neck: supple, no adenopathy, no thyromegaly, nontender             Heart: RRR, normal S1, S2, no murmurs                         Lungs: +bronchial breath sounds, no rhonchi,few mild wheezes, no rales                Extremities: no edema, nontender     Assessment: Encounter Diagnoses  Name Primary?  . Viral URI Yes  . Unspecified asthma(493.90)      Plan:  Current symptoms suggestive viral URI with mild asthma flare.  Specific recommendations today include:  Continue Dulera twice daily for prevention/maintenance  For this  week, use Albuterol rescue inhaler for cough and wheezing and shortness of breath, use 3-4 times daily  Drink plenty of water to clear mucous  Use nasal saline and salt water gargles for mucous in nose and back of throat  Ibuprofen for pain/fever  Rest in general  Begin Prednisone steroid, 2 tablets daily for 3 days  You can use Mucinex DM OTC for cough/congestion  If not much improvement by mid to late in the week, call back in case we need to add antibiotic.  Call/return in 2-3 days if symptoms are worse or not improving.

## 2014-08-03 NOTE — Patient Instructions (Signed)
  Thank you for giving me the opportunity to serve you today.    Your diagnosis today includes: Encounter Diagnoses  Name Primary?  . Viral URI Yes  . Unspecified asthma(493.90)      Specific recommendations today include:  Continue Dulera twice daily for prevention/maintenance  For this week, use Albuterol rescue inhaler for cough and wheezing and shortness of breath, use 3-4 times daily  Drink plenty of water to clear mucous  Use nasal saline and salt water gargles for mucous in nose and back of throat  Ibuprofen for pain/fever  Rest in general  Begin Prednisone steroid, 2 tablets daily for 3 days  You can use Mucinex DM OTC for cough/congestion  If not much improvement by mid to late in the week, call back in case we need to add antibiotic.  Return if symptoms worsen or fail to improve.

## 2015-04-22 ENCOUNTER — Telehealth: Payer: Self-pay | Admitting: Family Medicine

## 2015-04-22 MED ORDER — SCOPOLAMINE 1 MG/3DAYS TD PT72: 1.0000 | MEDICATED_PATCH | TRANSDERMAL | Status: DC

## 2015-04-22 NOTE — Telephone Encounter (Signed)
Pt. Called in stating that at his last appt. He and Dr.Knapp discussed a medication for sea sickness. Pt. does have appt. Pt. Also states he will be taking a boat trip on May 10, 2015. Pt. also states that he would like enough for his child and wife as well. They are all patients here as well. Please call pt. And let him know what steps he needs to take in order to get the prescription filled.

## 2015-04-22 NOTE — Telephone Encounter (Signed)
This was sent to me.

## 2015-04-22 NOTE — Telephone Encounter (Signed)
Done for #10, which is enough for all three of them.  Their charts were reviewed, and okay to take

## 2015-05-05 ENCOUNTER — Ambulatory Visit (INDEPENDENT_AMBULATORY_CARE_PROVIDER_SITE_OTHER): Payer: 59 | Admitting: Family Medicine

## 2015-05-05 ENCOUNTER — Encounter: Payer: Self-pay | Admitting: Family Medicine

## 2015-05-05 VITALS — BP 124/82 | HR 52 | Ht 70.0 in | Wt 186.2 lb

## 2015-05-05 DIAGNOSIS — Z23 Encounter for immunization: Secondary | ICD-10-CM | POA: Diagnosis not present

## 2015-05-05 DIAGNOSIS — Z0000 Encounter for general adult medical examination without abnormal findings: Secondary | ICD-10-CM | POA: Diagnosis not present

## 2015-05-05 DIAGNOSIS — Z131 Encounter for screening for diabetes mellitus: Secondary | ICD-10-CM

## 2015-05-05 DIAGNOSIS — N529 Male erectile dysfunction, unspecified: Secondary | ICD-10-CM | POA: Diagnosis not present

## 2015-05-05 DIAGNOSIS — J45909 Unspecified asthma, uncomplicated: Secondary | ICD-10-CM | POA: Diagnosis not present

## 2015-05-05 LAB — POCT URINALYSIS DIPSTICK
BILIRUBIN UA: NEGATIVE
Blood, UA: NEGATIVE
Glucose, UA: NEGATIVE
KETONES UA: NEGATIVE
Leukocytes, UA: NEGATIVE
NITRITE UA: NEGATIVE
Protein, UA: NEGATIVE
Spec Grav, UA: 1.025
Urobilinogen, UA: NEGATIVE
pH, UA: 6

## 2015-05-05 LAB — POCT CBG (FASTING - GLUCOSE)-MANUAL ENTRY: GLUCOSE FASTING, POC: 92 mg/dL (ref 70–99)

## 2015-05-05 MED ORDER — SILDENAFIL CITRATE 100 MG PO TABS: 50.0000 mg | ORAL_TABLET | Freq: Every day | ORAL | Status: DC | PRN

## 2015-05-05 NOTE — Patient Instructions (Signed)

## 2015-05-05 NOTE — Progress Notes (Signed)
Chief Complaint  Patient presents with  . Annual Exam    fasting annual exam. No concerns or complaints.    Seth Kerr is a 44 y.o. male who presents for a complete physical.  He has the following concerns:  He has questions about the transderm scopolamine patch that was called in for his cruise--when to put it on, and can he drink alcohol.  He has no other concerns or complaints.   Immunization History  Administered Date(s) Administered  . Influenza Split 09/12/2012, 08/29/2014  . Influenza,inj,Quad PF,36+ Mos 10/09/2013  . Tdap 04/22/2014   Last colonoscopy: done at approx age [age], and another one age 18. Reportedly normal. Diagnosed with IBS Last PSA: reports having had one done at age 78 (through work; given choice for blood test or exam, but ultimately ended up having prostate exam for some urinary complaints) Dentist: twice yearly Ophtho: yearly Exercise: 4-5 days/week weights and runing. (12 hrs shifts are 2 weeks days, then switches to 2 weeks nights).    Past Medical History  Diagnosis Date  . Allergic rhinitis, cause unspecified     immunotherapy x 15yrs (stopped end of 2015), per Dr. Donneta Romberg  . Asthma   . Erectile dysfunction   . Onychomycosis     s/p lamisil x 2 courses  . Irritable bowel syndrome (IBS)     has had 2 colonoscopies    Past Surgical History  Procedure Laterality Date  . Appendectomy  age 25  . Shoulder surgery Right   . Septoplasty  approx age 52    History   Social History  . Marital Status: Married    Spouse Name: N/A  . Number of Children: 3  . Years of Education: N/A   Occupational History  . 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 Smoking status: Never Smoker   . Smokeless tobacco: Never Used  . Alcohol Use: Yes     Comment: 4-5 drinks//week (no more than 2 drinks in a night)  . Drug Use: No  . Sexual Activity:    Partners: Female   Other Topics Concern  . Not on file   Social  History Narrative   Lives with wife, 3 kids, 4 dog.  He is a runner.  Moved here from [facility].  Curator, works 12 hour shifts.     Family History  Problem Relation Age of Onset  . Cancer Mother     lung cancer, smoker  . Alcohol abuse Mother   . Cancer Father     stomach cancer, smoker  . Alcohol abuse Father   . Asthma Daughter   . Diabetes Neg Hx     Outpatient Encounter Prescriptions as of 05/05/2015  Medication Sig Note  . DULERA 200-5 MCG/ACT AERO INHALE 2 PUFFS INTO THE LUNGS TWICE DAILY 05/05/2015: Received from: External Pharmacy  . levocetirizine (XYZAL) 5 MG tablet Take 5 mg by mouth every evening.   . mometasone (ELOCON) 0.1 % ointment Apply 1 application topically daily. 05/05/2015: Uses prn eczema  . mometasone (NASONEX) 50 MCG/ACT nasal spray Place 2 sprays into the nose 2 (two) times daily.     . montelukast (SINGULAIR) 10 MG tablet Take 10 mg by mouth. 05/05/2015: Received from: [place]  . albuterol (PROVENTIL) (2.5 MG/3ML) 0.083% nebulizer solution 2.5 mg. 05/05/2015: Received from: [place]  . scopolamine (TRANSDERM-SCOP, 1.5 MG,) 1 MG/3DAYS Place 1 patch (1.5 mg total) onto the skin every 3 (three) days. (  Patient not taking: Reported on 05/05/2015)   . sildenafil (VIAGRA) 100 MG tablet Take 0.5-1 tablets (50-100 mg total) by mouth daily as needed for erectile dysfunction.   . [DISCONTINUED] albuterol (PROVENTIL HFA;VENTOLIN HFA) 108 (90 BASE) MCG/ACT inhaler Inhale 2 puffs into the lungs every 6 (six) hours as needed for wheezing. (Patient not taking: Reported on 05/05/2015) 04/22/2014: Uses prn, hasn't been using  . [DISCONTINUED] mometasone-formoterol (DULERA) 100-5 MCG/ACT AERO Inhale 2 puffs into the lungs 2 (two) times daily.   . [DISCONTINUED] montelukast (SINGULAIR) 10 MG tablet Take 10 mg by mouth at bedtime.     . [DISCONTINUED] predniSONE (DELTASONE) 20 MG tablet 2 tablets daily for 3 days   . [DISCONTINUED] sildenafil (VIAGRA) 100 MG tablet  Take 0.5-1 tablets (50-100 mg total) by mouth daily as needed for erectile dysfunction. (Patient not taking: Reported on 05/05/2015) 04/22/2014: Uses sporadically   No facility-administered encounter medications on file as of 05/05/2015.    Allergies  Allergen Reactions  . Aspirin Other (See Comments)    Not actual allergy, had chronic nosebleeds as a child and was instructed not to use aspirin.   ROS: The patient denies anorexia, fever, weight changes, headaches, vision loss, decreased hearing, ear pain, hoarseness, chest pain, palpitations, dizziness, syncope, dyspnea on exertion, cough, swelling, nausea, vomiting, diarrhea, constipation, abdominal pain, melena, hematochezia, indigestion/heartburn, hematuria, incontinence, nocturia, dysuria, genital lesions, numbness, tingling, weakness, tremor, suspicious skin lesions, depression, anxiety, abnormal bleeding/bruising, or enlarged lymph nodes ED intermittent/mild (viagra is effective) Asthma/allergies are controlled. Intermittently he notes some trouble emptying bladder/weaker stream. Still has a lump on the foot (corn), but not painful. 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 Some pain with stretches, reaching behind him with left arm.  PHYSICAL EXAM:  BP 124/82 mmHg  Pulse 52  Ht 5\' 10"  (1.778 m)  Wt 186 lb 3.2 oz (84.46 kg)  BMI 26.72 kg/m2   General Appearance:   Alert, cooperative, no distress, appears stated age  Head:   Normocephalic, without obvious abnormality, atraumatic  Eyes:   PERRL, conjunctiva/corneas clear, EOM's intact, fundi   benign  Ears:   Normal TM's and external ear canals  Nose:  Nares normal, mucosa is mod edematous, clear-white drainage bilaterally. Sinuses nontender  Throat:  Lips, mucosa, and tongue normal; teeth and gums normal. OP is clear.  Neck:  Supple, no lymphadenopathy; thyroid: no enlargement/tenderness/nodules; no carotid  bruit or  JVD  Back:  Spine nontender, no curvature, ROM normal, no CVA tenderness  Lungs:   respirations unlabored, good air movement. No wheezing. Some coarse breath sounds initially that cleared. Had some crackles that persisted at the R base  Chest Wall:   No tenderness or deformity  Heart:   Regular rate and rhythm, S1 and S2 normal, no murmur, rub  or gallop  Breast Exam:   No chest wall tenderness, masses or gynecomastia  Abdomen:   Soft, non-tender, nondistended, normoactive bowel sounds,   no masses, no hepatosplenomegaly  Genitalia:   Normal male external genitalia without lesions. Testicles without masses. No inguinal hernias.  Rectal:   Normal sphincter tone, no masses or tenderness; guaiac negative stool. Prostate smooth, no nodules, mildly enlarged.  Extremities:  No clubbing, cyanosis or edema.   Pulses:  2+ and symmetric all extremities  Skin:  Skin color, texture, turgor normal, no rashes or lesions.Toenails are now normal (not thickened or discolored)  Lymph nodes:  Cervical, supraclavicular, and axillary nodes normal  Neurologic:  CNII-XII intact, normal strength,  sensation and gait; reflexes 2+ and symmetric throughout   Psych: Normal mood, affect, hygiene and grooming.         ASSESSMENT/PLAN:  Annual physical exam - Plan: POCT Urinalysis Dipstick, Visual acuity screening, Glucose (CBG), Fasting  Erectile dysfunction, unspecified erectile dysfunction type - Plan: sildenafil (VIAGRA) 100 MG tablet  Screening for diabetes mellitus - Plan: Glucose (CBG), Fasting  Immunization due - Plan: Pneumococcal polysaccharide vaccine 23-valent greater than or equal to 2yo subcutaneous/IM  Asthma, unspecified asthma severity, uncomplicated - Plan: Pneumococcal polysaccharide vaccine 23-valent greater than or equal to 2yo subcutaneous/IM   Discussed PSA screening (risks/benefits)--start at age 25;  recommended at least 30 minutes of aerobic activity at least 5 days/week, weight-bearing exercise at least 2x/week; proper sunscreen use reviewed; healthy diet and alcohol recommendations (less than or equal to 2 drinks/day) reviewed; regular seatbelt use; changing batteries in smoke detectors. Self-testicular exams. Immunization recommendations discussed--yearly flu shots. Pneumovax today due to his asthma.  Colonoscopy recommendations reviewed--due again age 36 (sooner prn problems).  F/u 1 year for CPE, sooner prn.

## 2015-05-10 ENCOUNTER — Encounter: Payer: 59 | Admitting: Family Medicine

## 2015-09-03 ENCOUNTER — Other Ambulatory Visit (INDEPENDENT_AMBULATORY_CARE_PROVIDER_SITE_OTHER): Payer: 59

## 2015-09-03 DIAGNOSIS — Z23 Encounter for immunization: Secondary | ICD-10-CM

## 2015-09-10 ENCOUNTER — Other Ambulatory Visit: Payer: Self-pay | Admitting: Family Medicine

## 2015-09-13 NOTE — Telephone Encounter (Signed)
It was written for #5 with 11 refills in June, shouldn't be needed (unless getting more than 5/month?)  I'm good with refilling if that is the case, and perhaps increase #/month?

## 2015-09-13 NOTE — Telephone Encounter (Signed)
Is this okay to refill? 

## 2016-03-08 ENCOUNTER — Ambulatory Visit: Payer: 59 | Admitting: Family Medicine

## 2016-03-08 ENCOUNTER — Ambulatory Visit
Admission: RE | Admit: 2016-03-08 | Discharge: 2016-03-08 | Disposition: A | Discharge status: Home / Self Care | Payer: 59 | Source: Ambulatory Visit | Attending: Allergy | Admitting: Allergy

## 2016-03-08 ENCOUNTER — Other Ambulatory Visit: Payer: Self-pay | Admitting: Allergy

## 2016-03-08 DIAGNOSIS — J4530 Mild persistent asthma, uncomplicated: Secondary | ICD-10-CM

## 2016-03-15 ENCOUNTER — Ambulatory Visit (INDEPENDENT_AMBULATORY_CARE_PROVIDER_SITE_OTHER): Payer: 59 | Admitting: Family Medicine

## 2016-03-15 VITALS — BP 114/80 | HR 68 | Temp 97.9°F | Wt 202.6 lb

## 2016-03-15 DIAGNOSIS — R938 Abnormal findings on diagnostic imaging of other specified body structures: Secondary | ICD-10-CM | POA: Diagnosis not present

## 2016-03-15 DIAGNOSIS — R9389 Abnormal findings on diagnostic imaging of other specified body structures: Secondary | ICD-10-CM

## 2016-03-15 NOTE — Progress Notes (Signed)
   Subjective:    Patient ID: Seth Kerr, male    DOB: 1971/10/31, 45 y.o.   MRN: [id number]  HPI He is here for consultation concerning an abnormal chest x-ray. He has been having difficulty with coughing as well as allergy symptoms. An x-ray was ordered which did show some perihilar changes as well as a possible nodular density. He was apparently told that he had pneumonia. He presently is on an unknown antibiotic and states it was given to him for a full month. He is having no chest pain but is doubly having difficulty with coughing.   Review of Systems     Objective:   Physical Exam Alert and in no distress. Lungs are clear to auscultation.       Assessment & Plan:  Abnormal chest x-ray - Plan: DG Chest 2 View I reviewed the x-ray with him. Explained that there was no evidence of pneumonia but deadly some changes at require follow-up. I will put in a future order for an x-ray and roughly 5 weeks. He will discuss this further with his allergist, Dr. Donneta Romberg.

## 2016-03-15 NOTE — Progress Notes (Deleted)
   Subjective:    Patient ID: Seth Kerr, male    DOB: Oct 15, 1971, 45 y.o.   MRN: [id number]  Pneumonia      Review of Systems     Objective:   Physical Exam        Assessment & Plan:

## 2016-03-30 ENCOUNTER — Telehealth: Payer: Self-pay | Admitting: Allergy and Immunology

## 2016-03-30 NOTE — Telephone Encounter (Signed)
Please call back regarding bill received.

## 2016-03-31 NOTE — Telephone Encounter (Signed)
Left message - will adj balance

## 2016-05-05 ENCOUNTER — Other Ambulatory Visit: Payer: Self-pay | Admitting: Allergy

## 2016-05-05 ENCOUNTER — Ambulatory Visit
Admission: RE | Admit: 2016-05-05 | Discharge: 2016-05-05 | Disposition: A | Discharge status: Home / Self Care | Payer: 59 | Source: Ambulatory Visit | Attending: Allergy | Admitting: Allergy

## 2016-05-05 DIAGNOSIS — J4530 Mild persistent asthma, uncomplicated: Secondary | ICD-10-CM

## 2016-05-17 ENCOUNTER — Encounter: Payer: Self-pay | Admitting: Family Medicine

## 2016-05-17 ENCOUNTER — Ambulatory Visit (INDEPENDENT_AMBULATORY_CARE_PROVIDER_SITE_OTHER): Payer: 59 | Admitting: Family Medicine

## 2016-05-17 VITALS — BP 122/74 | HR 60 | Ht 70.0 in | Wt 204.6 lb

## 2016-05-17 DIAGNOSIS — Z0000 Encounter for general adult medical examination without abnormal findings: Secondary | ICD-10-CM

## 2016-05-17 DIAGNOSIS — J4530 Mild persistent asthma, uncomplicated: Secondary | ICD-10-CM | POA: Diagnosis not present

## 2016-05-17 DIAGNOSIS — J309 Allergic rhinitis, unspecified: Secondary | ICD-10-CM | POA: Diagnosis not present

## 2016-05-17 DIAGNOSIS — B351 Tinea unguium: Secondary | ICD-10-CM

## 2016-05-17 DIAGNOSIS — N529 Male erectile dysfunction, unspecified: Secondary | ICD-10-CM

## 2016-05-17 DIAGNOSIS — R079 Chest pain, unspecified: Secondary | ICD-10-CM | POA: Diagnosis not present

## 2016-05-17 DIAGNOSIS — Z5181 Encounter for therapeutic drug level monitoring: Secondary | ICD-10-CM | POA: Diagnosis not present

## 2016-05-17 LAB — POCT URINALYSIS DIPSTICK
BILIRUBIN UA: NEGATIVE
Glucose, UA: NEGATIVE
Ketones, UA: NEGATIVE
LEUKOCYTES UA: NEGATIVE
NITRITE UA: NEGATIVE
PH UA: 5.5
PROTEIN UA: NEGATIVE
RBC UA: NEGATIVE
Spec Grav, UA: 1.025
UROBILINOGEN UA: NEGATIVE

## 2016-05-17 LAB — CBC WITH DIFFERENTIAL/PLATELET
BASOS ABS: 66 {cells}/uL (ref 0–200)
BASOS PCT: 1 %
EOS ABS: 1254 {cells}/uL — AB (ref 15–500)
Eosinophils Relative: 19 %
HEMATOCRIT: 44.9 % (ref 38.5–50.0)
Hemoglobin: 15.3 g/dL (ref 13.2–17.1)
LYMPHS PCT: 26 %
Lymphs Abs: 1716 cells/uL (ref 850–3900)
MCH: 30.1 pg (ref 27.0–33.0)
MCHC: 34.1 g/dL (ref 32.0–36.0)
MCV: 88.2 fL (ref 80.0–100.0)
MONO ABS: 990 {cells}/uL — AB (ref 200–950)
MONOS PCT: 15 %
MPV: 8.9 fL (ref 7.5–12.5)
Neutro Abs: 2574 cells/uL (ref 1500–7800)
Neutrophils Relative %: 39 %
PLATELETS: 231 10*3/uL (ref 140–400)
RBC: 5.09 MIL/uL (ref 4.20–5.80)
RDW: 13.7 % (ref 11.0–15.0)
WBC: 6.6 10*3/uL (ref 4.0–10.5)

## 2016-05-17 LAB — COMPREHENSIVE METABOLIC PANEL
ALBUMIN: 4.5 g/dL (ref 3.6–5.1)
ALK PHOS: 48 U/L (ref 40–115)
ALT: 25 U/L (ref 9–46)
AST: 20 U/L (ref 10–40)
BUN: 15 mg/dL (ref 7–25)
CHLORIDE: 101 mmol/L (ref 98–110)
CO2: 26 mmol/L (ref 20–31)
Calcium: 9.3 mg/dL (ref 8.6–10.3)
Creat: 0.99 mg/dL (ref 0.60–1.35)
Glucose, Bld: 97 mg/dL (ref 65–99)
POTASSIUM: 4.4 mmol/L (ref 3.5–5.3)
Sodium: 139 mmol/L (ref 135–146)
TOTAL PROTEIN: 7.1 g/dL (ref 6.1–8.1)
Total Bilirubin: 0.6 mg/dL (ref 0.2–1.2)

## 2016-05-17 LAB — LIPID PANEL
CHOLESTEROL: 210 mg/dL — AB (ref 125–200)
HDL: 102 mg/dL (ref >=40)
LDL CALC: 98 mg/dL (ref <130)
TRIGLYCERIDES: 50 mg/dL (ref <150)
Total CHOL/HDL Ratio: 2.1 Ratio (ref <=5.0)
VLDL: 10 mg/dL (ref <30)

## 2016-05-17 MED ORDER — TERBINAFINE HCL 250 MG PO TABS: 250.0000 mg | ORAL_TABLET | Freq: Every day | ORAL | Status: DC

## 2016-05-17 MED ORDER — SILDENAFIL CITRATE 100 MG PO TABS: 50.0000 mg | ORAL_TABLET | Freq: Every day | ORAL | Status: DC | PRN

## 2016-05-17 NOTE — Patient Instructions (Signed)
  HEALTH MAINTENANCE RECOMMENDATIONS:  It is recommended that you get at least 30 minutes of aerobic exercise at least 5 days/week (for weight loss, you may need as much as 60-90 minutes). This can be any activity that gets your heart rate up. This can be divided in 10-15 minute intervals if needed, but try and build up your endurance at least once a week.  Weight bearing exercise is also recommended twice weekly.  Eat a healthy diet with lots of vegetables, fruits and fiber.  "Colorful" foods have a lot of vitamins (ie green vegetables, tomatoes, red peppers, etc).  Limit sweet tea, regular sodas and alcoholic beverages, all of which has a lot of calories and sugar.  Up to 2 alcoholic drinks daily may be beneficial for men (unless trying to lose weight, watch sugars).  Drink a lot of water.  Sunscreen of at least SPF 30 should be used on all sun-exposed parts of the skin when outside between the hours of 10 am and 4 pm (not just when at beach or pool, but even with exercise, golf, tennis, and yard work!)  Use a sunscreen that says "broad spectrum" so it covers both UVA and UVB rays, and make sure to reapply every 1-2 hours.  Remember to change the batteries in your smoke detectors when changing your clock times in the spring and fall.  Use your seat belt every time you are in a car, and please drive safely and not be distracted with cell phones and texting while driving.   Wait until you hear that your liver tests are normal before starting the lamisil for your toenails (we will give you the results tomorrow). It is unclear why you are having the chest pain. Given that it is intermittent and improving, it isn't likely significant.  If it doesn't completely resolve, you should have the chest x-ray repeated (orders are in the system for you to go to [place] when it is convenient for you, if not resolved by next week). If the d-dimer test is abnormal, we will be contacting you to go to the  hospital for additional studies, as this can indicate a blood clot.  Given the recent long drive and complaint of chest pain, it is worth checking for, but not likely the cause of your pain.  We will be in touch with these results.  Return in 6 weeks for liver tests (mid-way through the Lamisil course).

## 2016-05-17 NOTE — Progress Notes (Signed)
Chief Complaint  Patient presents with  . Annual Exam    fasting annual exam. Did not do eye exam as he just had one. Woke up this past Sunday night with a dull chest pain on the left side-still sort of sore.    Seth Kerr is a 45 y.o. male who presents for a complete physical.  He has the following concerns:  Three nights ago, when awoken by the dog, he felt a tightness/ache in the left chest, like a pulled muscle.  Occurred suddenly, not related to any activity. It persisted throughout the day, notes some very mild, intermittent tightness recur.  He used albuterol once, which seemed to help a little.  No DOE, still able to run regularly.  He was on vacation last week, returning 6/22--long drive (back from [place], 8 hr trip, 2 stops).  He had CXR in April (when seen by allergist for acute visit, with cough), showing new perihilar and basilar opacities, suggesting atelectasis or bronchitis, possibly pneumonia (nodular focus at [facility]) but couldn't exclude nodule.  He was treated with ABX and had f/u CXR 6/16 which showed interval improvement in the lingular airspace process with minimal residual linear density over the lingula, likely atelactasis.    He is having recurrent toenail discoloration, asking for another course of lamisil.  Toenail discoloration had completely resolved with course in the past, only started recurring earlier this spring (April).  Asthma and allergies: overall doing well.  Changed from dulera to [facility] at visit in April (when there for sick visit--the infection as above).  He used the rescue inhaler once in the last few days, as mentioned above, otherwise hasn't needed at all.  Denies any allergy symptoms.  Erectile dysfunction--He only used 2 tablets of the Viagra, and it was very effective.  Refills have expired, requesting refill (has 2 left).  Immunization History  Administered Date(s) Administered  . Influenza Split 09/12/2012, 08/29/2014  . Influenza,inj,Quad  PF,36+ Mos 10/09/2013, 09/03/2015  . Pneumococcal Polysaccharide-23 05/05/2015  . Tdap 04/22/2014   Last colonoscopy: done at approx age [age], and another one age 45. Reportedly normal. Diagnosed with IBS Last PSA: reports having had one done at age 81 (through work; given choice for blood test or exam, but ultimately ended up having prostate exam for some urinary complaints) Dentist: twice yearly Ophtho: yearly Exercise: 4-5 days/week weights and running. (12 hrs shifts are 2 weeks days, then switches to 2 weeks nights).   Past Medical History  Diagnosis Date  . Allergic rhinitis, cause unspecified     immunotherapy x 52yrs (stopped end of 2015), per Dr. Donneta Romberg  . Asthma   . Erectile dysfunction   . Onychomycosis     s/p lamisil x 2 courses  . Irritable bowel syndrome (IBS)     has had 2 colonoscopies    Past Surgical History  Procedure Laterality Date  . Appendectomy  age 13  . Shoulder surgery Right   . Septoplasty  approx age 13    Social History   Social History  . Marital Status: Married    Spouse Name: N/A  . Number of Children: 3  . Years of Education: N/A   Occupational History  . 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 Smokeless tobacco: Never Used  . Alcohol Use: Yes     Comment: 4-5 drinks//week (no more than 2 drinks in a night)  . Drug Use: No  .  Sexual Activity:    Partners: Female   Other Topics Concern  . Not on file   Social History Narrative   Lives with wife, 3 kids, 3 dogs.  He is a runner.  Moved here from [facility].  Curator, works 12 hour shifts, on a rotating schedule.     Family History  Problem Relation Age of Onset  . Cancer Mother     lung cancer, smoker  . Alcohol abuse Mother   . Cancer Father     stomach cancer, smoker  . Alcohol abuse Father   . Asthma Daughter   . Diabetes Neg Hx   . Heart disease Neg Hx     Outpatient Encounter  Prescriptions as of 05/17/2016  Medication Sig Note  . fluticasone furoate-vilanterol (BREO ELLIPTA) 200-25 MCG/INH AEPB Inhale 1 puff into the lungs daily.   Marland Kitchen levocetirizine (XYZAL) 5 MG tablet Take 5 mg by mouth every evening.   . montelukast (SINGULAIR) 10 MG tablet Take 10 mg by mouth. Reported on 03/15/2016 05/05/2015: Received from: [facility]  . sildenafil (VIAGRA) 100 MG tablet Take 0.5-1 tablets (50-100 mg total) by mouth daily as needed for erectile dysfunction.   Marland Kitchen albuterol (PROVENTIL) (2.5 MG/3ML) 0.083% nebulizer solution 2.5 mg. Reported on 05/17/2016 05/05/2015: Received from: [place]  . mometasone (ELOCON) 0.1 % ointment Apply 1 application topically daily. Reported on 05/17/2016 05/05/2015: Uses prn eczema  . mometasone (NASONEX) 50 MCG/ACT nasal spray Place 2 sprays into the nose 2 (two) times daily. Reported on 05/17/2016   . [DISCONTINUED] DULERA 200-5 MCG/ACT AERO INHALE 2 PUFFS INTO THE LUNGS TWICE DAILY 05/05/2015: Received from: External Pharmacy  . [DISCONTINUED] scopolamine (TRANSDERM-SCOP, 1.5 MG,) 1 MG/3DAYS Place 1 patch (1.5 mg total) onto the skin every 3 (three) days. (Patient not taking: Reported on 05/05/2015)   . [DISCONTINUED] VIAGRA 100 MG tablet TAKE 0.5-1 TABLETS (50-100 MG TOTAL) BY MOUTH DAILY AS NEEDED FOR ERECTILE DYSFUNCTION.    No facility-administered encounter medications on file as of 05/17/2016.    Allergies  Allergen Reactions  . Aspirin Other (See Comments)    Not actual allergy, had chronic nosebleeds as a child and was instructed not to use aspirin.    ROS: The patient denies anorexia, fever, weight changes, headaches, vision loss, decreased hearing, ear pain, hoarseness, palpitations, dizziness, syncope, dyspnea on exertion, cough, swelling, nausea, vomiting, diarrhea, constipation, abdominal pain, melena, hematochezia, indigestion/heartburn, hematuria, incontinence, nocturia, dysuria, genital lesions, numbness, tingling, weakness, tremor,  suspicious skin lesions, depression, anxiety, abnormal bleeding/bruising, or enlarged lymph nodes ED intermittent/mild (viagra is effective)--rare Asthma/allergies are controlled. Still has a lump on the foot (corn), but not painful. Some intermittent left shoulder pain (h/o frozen shoulder 06/2014)--only intermittent discomfort, with certain positions; overall improved. Chest discomfort as per HPI. Recurrent discoloration of toenails. Weight gain 18# since physical last year, up just 2# from April's visit.  Recent vacation, in school/studying--change in exercise routine and diet.  PHYSICAL EXAM:  BP 122/74 mmHg  Pulse 60  Ht 5\' 10"  (1.778 m)  Wt 204 lb 9.6 oz (92.806 kg)  BMI 29.36 kg/m2  General Appearance:   Alert, cooperative, no distress, appears stated age  Head:   Normocephalic, without obvious abnormality, atraumatic  Eyes:   PERRL, conjunctiva/corneas clear, EOM's intact, fundi   benign  Ears:   Normal TM's and external ear canals  Nose:  Nares normal, mucosa is mod edematous, clear-white drainage bilaterally. Sinuses nontender  Throat:  Lips, mucosa, and tongue normal; teeth and  gums normal. OP is clear.  Neck:  Supple, no lymphadenopathy; thyroid: no enlargement/tenderness/nodules; no carotid  bruit or JVD  Back:  Spine nontender, no curvature, ROM normal, no CVA tenderness  Lungs:   respirations unlabored, good air movement. No wheezing. Some coarse breath sounds initially that cleared. Had some crackles that persisted at the R base  Chest Wall:   No tenderness or deformity  Heart:   Regular rate and rhythm, S1 and S2 normal, no murmur, rub  or gallop  Breast Exam:   No chest wall tenderness, masses or gynecomastia  Abdomen:   Soft, non-tender, nondistended, normoactive bowel sounds,   no masses, no hepatosplenomegaly  Genitalia:   Normal male external genitalia without lesions.  Testicles without masses. No inguinal hernias.  Rectal:   Normal sphincter tone, no masses or tenderness; guaiac negative stool. Prostate smooth, no nodules, mildly enlarged.  Extremities:  No clubbing, cyanosis or edema.   Pulses:  2+ and symmetric all extremities  Skin:  Skin color, texture, turgor normal, no rashes or lesions.Toenails are normal  Lymph nodes:  Cervical, supraclavicular, and axillary nodes normal  Neurologic:  CNII-XII intact, normal strength, sensation and gait; reflexes 2+ and symmetric throughout   Psych: Normal mood, affect, hygiene and grooming             2,3,4th toes on the left, and 3rd and 4th on the right have white discoloration of the nails; no thickening. Chest wall not tender  ASSESSMENT/PLAN:  Annual physical exam - Plan: POCT Urinalysis Dipstick, Lipid panel, Comprehensive metabolic panel, CBC with Differential/Platelet  Erectile dysfunction, unspecified erectile dysfunction type - Plan: sildenafil (VIAGRA) 100 MG tablet  Asthma, mild persistent, uncomplicated - controlled  Allergic rhinitis, unspecified allergic rhinitis type - controlled  Onychomycosis - Plan: terbinafine (LAMISIL) 250 MG tablet  Left sided chest pain - Plan: CBC with Differential/Platelet, D-dimer, quantitative (not at [facility]), DG Chest 2 View  Medication monitoring encounter - Plan: Hepatic function panel   Discussed weight gain (at abdomen/waist) and encouraged weight loss. Chest pain--normal exam.  Chest nontender, lungs clear.  Recent CXR showed improvement in abnormalities seen in April.  Doubt PE, but had long car ride recently, so will check d-dimer with today's labs.  Recurrent onychomycosis--repeat course of lamisil.  Return in 6 weeks for LFT's.  Discussed PSA screening (risks/benefits)--start at age 33; recommended at least 30 minutes of aerobic activity at least 5 days/week, weight-bearing exercise at  least 2x/week; proper sunscreen use reviewed; healthy diet and alcohol recommendations (less than or equal to 2 drinks/day) reviewed; regular seatbelt use; changing batteries in smoke detectors. Self-testicular exams. Immunization recommendations discussed--continue yearly flu shots.. Colonoscopy recommendations reviewed--due again age 62 (sooner prn problems).  F/u 1 year for CPE, sooner prn.

## 2016-05-18 LAB — D-DIMER, QUANTITATIVE: D-Dimer, Quant: 0.24 mcg/mL FEU (ref <0.50)

## 2016-06-12 ENCOUNTER — Ambulatory Visit (INDEPENDENT_AMBULATORY_CARE_PROVIDER_SITE_OTHER): Payer: 59 | Admitting: Internal Medicine

## 2016-06-12 ENCOUNTER — Encounter: Payer: Self-pay | Admitting: Internal Medicine

## 2016-06-12 VITALS — BP 122/82 | HR 57 | Ht 70.0 in | Wt 201.0 lb

## 2016-06-12 DIAGNOSIS — R0689 Other abnormalities of breathing: Secondary | ICD-10-CM | POA: Diagnosis not present

## 2016-06-12 DIAGNOSIS — Z8709 Personal history of other diseases of the respiratory system: Secondary | ICD-10-CM

## 2016-06-12 DIAGNOSIS — Z91048 Other nonmedicinal substance allergy status: Secondary | ICD-10-CM

## 2016-06-12 DIAGNOSIS — R0600 Dyspnea, unspecified: Secondary | ICD-10-CM | POA: Diagnosis not present

## 2016-06-12 DIAGNOSIS — Z9109 Other allergy status, other than to drugs and biological substances: Secondary | ICD-10-CM

## 2016-06-12 LAB — NITRIC OXIDE: NITRIC OXIDE: 26

## 2016-06-12 NOTE — Patient Instructions (Addendum)
ICD-9-CM ICD-10-CM   1. Dyspnea and respiratory abnormality 786.09 R06.00     R06.89   2. History of asthma V12.69 Z87.09   3. History of environmental allergies V15.09 Z91.048     Do full pulmonary function test next 2-3 days anytime This will tell us how much of the lung function is improved while here in the middle of a prednisone  Follow-up Please call us [phone] as well as the pulmonary function test is done   - so we can call you back and advised the next ste[

## 2016-06-12 NOTE — Progress Notes (Signed)
Subjective:    Patient ID: Seth Kerr, male    DOB: 10-28-1971, 45 y.o.   MRN: [id number]  HPI   IOV 06/12/2016  Chief Complaint  Patient presents with  . PULMONARY CONSULT    Referred by Dr. Mosetta Anis. Pt reports increasing breathing issues since moving to [place] 6 years ago. Pt had recent pna in April. Pt c/o continued SOB and chest tightness that is worse with exertion and some cough at night. Pt denies cough. Pt is on Breo and albuterol HFA. Pt is also on oral prednisone taper currently. Pt has significant second hand smoke exposure. Pt is on allergy injections weekly.     45 year old male works at [employer]. He is been referred by Dr. Donneta Romberg for evaluation of shortness of breath in the setting of confirmed diagnosis of asthma and allergies  History is gained from talking to the patient and also review of the referral notes. He tells me that he worked in the [employer] area. When he lived there he had no prior diagnosis of asthma respirator complaints. Moved to [facility] approximately 6 years ago and then within several months started noticing insidious onset of shortness of breath and chest tightness. For the last 4-5 years he has been seeing Dr. Donneta Romberg with a confirmed diagnosis of asthma. He was then started on inhaled steroids and Singulair. With this he also had allergy testing which was positive. He says once his treatments commenced he was able to return to normal activities of daily living including running long distances. He felt well. Approximately one year ago he stopped his allergy shots. Then in April 2017 he says he had a pneumonia for which she was treated with antibiotics. There is a chest x-ray report from 03/08/2016 that reports a nodular focus in the left lower lobe which could her percent focus of pneumonia but could not exclude a pulmonary nodule. I personally visualized this film and agree. He had follow-up chest x-ray 05/05/2016 that showed  improvement. He says that he was beginning to feel better but then started having nocturnal symptoms again with shortness of breath. Shortness of breath is exertional. His been going on for the last few weeks. He is now unable to climb stairs without feeling shortness of breath. Currently is in the middle of a prednisone taper  In terms of his allergies: He had repeat allergy testing April 2017 and it is positive for different environmental allergens. He has now been restarted on allergy shots.  He has had spirometry with Dr. Donneta Romberg. In 07/21/2015 FEV1 1.44 L/35% with a ratio 68 showing severe obstruction. Follow-up 03/08/2016 on the time of pneumonia FEV1 2.2 L/53% with a ratio of 79 with a 13% bronchodilator response. Again showing obstruction of moderate degree. He had a repeat spirometry 03/17/2016 which is only prebronchodilator that showed further improvement FEV1 2.68 L/64% ratio 79. And then again 06/05/2016 he had no further improvement with a post bronchodilator response FEV1 of 2.5 L/61 ratio 80 which is a 15% bronchi were to response. Exhaled nitric oxide on the same day was elevated at 59 ppb. Due to obstructive nature of the spirometry is been referred here.  Walking desaturation test on an 85 feet 3 laps on room at: Did not desaturate below 97%. NOTED BRADYCARDIA - HR 60s entire walk- walked fast  Exhaled nitric oxide today in our office: 26 ppb and normal (was 59 on 06/05/16 @ Dr Donneta Romberg in office )   Noted: he  is in middle of a 12d prednisone taper - 5 days left    has a past medical history of Allergic rhinitis, cause unspecified; Asthma; Erectile dysfunction; Irritable bowel syndrome (IBS); and Onychomycosis.   reports that he has never smoked. He has never used smokeless tobacco.  Past Surgical History:  Procedure Laterality Date  . APPENDECTOMY  age 25  . SEPTOPLASTY  approx age 10  . SHOULDER SURGERY Right     Allergies  Allergen Reactions  . Aspirin Other (See  Comments)    Not actual allergy, had chronic nosebleeds as a child and was instructed not to use aspirin.    Immunization History  Administered Date(s) Administered  . Influenza Split 09/12/2012, 08/29/2014  . Influenza,inj,Quad PF,36+ Mos 10/09/2013, 09/03/2015  . Pneumococcal Polysaccharide-23 05/05/2015  . Tdap 04/22/2014    Family History  Problem Relation Age of Onset  . Cancer Mother     lung cancer, smoker  . Alcohol abuse Mother   . Cancer Father     stomach cancer, smoker  . Alcohol abuse Father   . Asthma Daughter   . Diabetes Neg Hx   . Heart disease Neg Hx      Current Outpatient Prescriptions:  .  albuterol (PROVENTIL HFA;VENTOLIN HFA) 108 (90 Base) MCG/ACT inhaler, Inhale 1-2 puffs into the lungs every 6 (six) hours as needed for wheezing or shortness of breath., Disp: , Rfl:  .  EPIPEN 2-PAK 0.3 MG/0.3ML SOAJ injection, as directed., Disp: , Rfl: 1 .  fluticasone furoate-vilanterol (BREO ELLIPTA) 200-25 MCG/INH AEPB, Inhale 1 puff into the lungs daily., Disp: , Rfl:  .  levocetirizine (XYZAL) 5 MG tablet, Take 5 mg by mouth every evening., Disp: , Rfl:  .  mometasone (ELOCON) 0.1 % ointment, Apply 1 application topically daily. Reported on 05/17/2016, Disp: , Rfl:  .  mometasone (NASONEX) 50 MCG/ACT nasal spray, Place 2 sprays into the nose 2 (two) times daily. Reported on 05/17/2016, Disp: , Rfl:  .  montelukast (SINGULAIR) 10 MG tablet, Take 10 mg by mouth. Reported on 03/15/2016, Disp: , Rfl:  .  predniSONE (STERAPRED UNI-PAK 21 TAB) 10 MG (21) TBPK tablet, See admin instructions., Disp: , Rfl: 0 .  sildenafil (VIAGRA) 100 MG tablet, Take 0.5-1 tablets (50-100 mg total) by mouth daily as needed for erectile dysfunction., Disp: 5 tablet, Rfl: 11 .  terbinafine (LAMISIL) 250 MG tablet, Take 1 tablet (250 mg total) by mouth daily., Disp: 30 tablet, Rfl: 2     Review of Systems  Constitutional: Negative.  Negative for fever and unexpected weight change.  HENT:  Positive for congestion and rhinorrhea. Negative for dental problem, ear pain, nosebleeds, postnasal drip, sinus pressure, sneezing, sore throat and trouble swallowing.   Eyes: Negative.  Negative for redness and itching.  Respiratory: Positive for chest tightness and shortness of breath. Negative for cough and wheezing.   Cardiovascular: Negative.  Negative for palpitations and leg swelling.  Gastrointestinal: Negative.  Negative for nausea and vomiting.  Endocrine: Negative.   Genitourinary: Negative.  Negative for dysuria.  Musculoskeletal: Negative.  Negative for joint swelling.  Skin: Negative.  Negative for rash.  Allergic/Immunologic: Positive for environmental allergies.  Neurological: Positive for headaches.  Hematological: Negative.  Does not bruise/bleed easily.  Psychiatric/Behavioral: Negative.  Negative for dysphoric mood. The patient is not nervous/anxious.        Objective:   Physical Exam  Constitutional: He is oriented to person, place, and time. He appears well-developed and well-nourished. No distress.  HENT:  Head: Normocephalic and atraumatic.  Right Ear: External ear normal.  Left Ear: External ear normal.  Mouth/Throat: Oropharynx is clear and moist. No oropharyngeal exudate.  Eyes: Conjunctivae and EOM are normal. Pupils are equal, round, and reactive to light. Right eye exhibits no discharge. Left eye exhibits no discharge. No scleral icterus.  Neck: Normal range of motion. Neck supple. No JVD present. No tracheal deviation present. No thyromegaly present.  Cardiovascular: Normal rate, regular rhythm and intact distal pulses.  Exam reveals no gallop and no friction rub.   No murmur heard. Pulmonary/Chest: Effort normal and breath sounds normal. No respiratory distress. He has no wheezes. He has no rales. He exhibits no tenderness.  Abdominal: Soft. Bowel sounds are normal. He exhibits no distension and no mass. There is no tenderness. There is no rebound and no  guarding.  Musculoskeletal: Normal range of motion. He exhibits no edema or tenderness.  Lymphadenopathy:    He has no cervical adenopathy.  Neurological: He is alert and oriented to person, place, and time. He has normal reflexes. No cranial nerve deficit. Coordination normal.  Skin: Skin is warm and dry. No rash noted. He is not diaphoretic. No erythema. No pallor.  Psychiatric: He has a normal mood and affect. His behavior is normal. Judgment and thought content normal.  Nursing note and vitals reviewed.   Vitals:   06/12/16 1537  BP: 122/82  Pulse: (!) 57  SpO2: 96%  Weight: 201 lb (91.2 kg)  Height: 5\' 10"  (1.778 m)         Assessment & Plan:     ICD-9-CM ICD-10-CM   1. Dyspnea and respiratory abnormality 786.09 R06.00     R06.89   2. History of asthma V12.69 Z87.09 Nitric oxide  3. History of environmental allergies V15.09 Z91.048     Dyspnea can be multifactorial. One possibility is deconditioning after recent viral illnesses. The other is persistent obstructive lung function. He is somewhat better when terms of his effort tolerance and also his exam nitric oxide with him being on schedule prednisone. Other differential diagnosis is chronotropic incompetence but I doubt it because he did not feel short of breath despite having bradycardia while he did submaximal exercise test in our office. Other differential diagnoses pulmonary infiltrates.   Plan at this point in time we will get full pulmonary function test while is in the midst of his prednisone and see how much improvement he has compared to his previous spirometry is. We can then try to correlate with his dyspnea. Dyspnea on these results we'll get a high-resolution CT chest versus doing pulmonary stress test versus observing. Patient is agreeable with the plan   Do full pulmonary function test next 2-3 days anytime This will tell us how much of the lung function is improved while here in the middle of a  prednisone  Follow-up Please call us [phone] as well as the pulmonary function test is done   - so we can call you back and advised the next ste[     Dr. Brand Males, M.D., [facility].C.P Pulmonary and Critical Care Medicine Staff Physician [place] [facility] Pager: [phone], If no answer or between  15:00h - 7:00h: call [phone]  06/12/2016 4:13 PM

## 2016-06-15 ENCOUNTER — Telehealth: Payer: Self-pay | Admitting: Internal Medicine

## 2016-06-15 ENCOUNTER — Ambulatory Visit (HOSPITAL_COMMUNITY)
Admission: RE | Admit: 2016-06-15 | Discharge: 2016-06-15 | Disposition: A | Discharge status: Home / Self Care | Payer: 59 | Source: Ambulatory Visit | Attending: Internal Medicine | Admitting: Internal Medicine

## 2016-06-15 DIAGNOSIS — R0689 Other abnormalities of breathing: Secondary | ICD-10-CM | POA: Insufficient documentation

## 2016-06-15 DIAGNOSIS — Z8709 Personal history of other diseases of the respiratory system: Secondary | ICD-10-CM

## 2016-06-15 DIAGNOSIS — R0600 Dyspnea, unspecified: Secondary | ICD-10-CM | POA: Diagnosis present

## 2016-06-15 DIAGNOSIS — Z9109 Other allergy status, other than to drugs and biological substances: Secondary | ICD-10-CM

## 2016-06-15 LAB — PULMONARY FUNCTION TEST
DL/VA % PRED: 153 %
DL/VA: 7.15 ml/min/mmHg/L
DLCO UNC: 41.88 ml/min/mmHg
DLCO unc % pred: 129 %
FEF 25-75 POST: 4.72 L/s
FEF 25-75 Pre: 3.21 L/sec
FEF2575-%Change-Post: 47 %
FEF2575-%PRED-POST: 128 %
FEF2575-%PRED-PRE: 87 %
FEV1-%CHANGE-POST: 9 %
FEV1-%PRED-PRE: 84 %
FEV1-%Pred-Post: 93 %
FEV1-PRE: 3.42 L
FEV1-Post: 3.76 L
FEV1FVC-%Change-Post: 3 %
FEV1FVC-%PRED-PRE: 101 %
FEV6-%Change-Post: 7 %
FEV6-%Pred-Post: 90 %
FEV6-%Pred-Pre: 84 %
FEV6-Post: 4.54 L
FEV6-Pre: 4.24 L
FEV6FVC-%Change-Post: 0 %
FEV6FVC-%Pred-Post: 103 %
FEV6FVC-%Pred-Pre: 102 %
FVC-%Change-Post: 6 %
FVC-%PRED-POST: 88 %
FVC-%PRED-PRE: 82 %
FVC-POST: 4.54 L
FVC-Pre: 4.26 L
POST FEV1/FVC RATIO: 83 %
PRE FEV1/FVC RATIO: 80 %
PRE FEV6/FVC RATIO: 99 %
Post FEV6/FVC ratio: 100 %
RV % pred: 96 %
RV: 1.88 L
TLC % PRED: 90 %
TLC: 6.27 L

## 2016-06-15 MED ORDER — ALBUTEROL SULFATE (2.5 MG/3ML) 0.083% IN NEBU
2.5000 mg | INHALATION_SOLUTION | Freq: Once | RESPIRATORY_TRACT | Status: AC
  Administered 2016-06-15: 2.5 mg via RESPIRATORY_TRACT

## 2016-06-15 NOTE — Telephone Encounter (Signed)
Called and lm on VM for pt.  Will forward this message to MR to make him aware that he has completed his PFT today.  MR please advise. thanks

## 2016-06-16 ENCOUNTER — Telehealth: Payer: Self-pay | Admitting: Internal Medicine

## 2016-06-16 NOTE — Telephone Encounter (Signed)
LMTCB

## 2016-06-16 NOTE — Telephone Encounter (Signed)
PFTSs are normal and much improved then every single breathing test he has had before this. This improvement is because of prednisone. However, the PFTs are suggesting (both current and past) that asthma is primary problelm and even on current PFT 06/15/16 there is room for improvement  PLAN  - contiue high dose breo daily  - Add spiriva daily respimat low dose   - continue singulair  - finish pred taper  - ROV with me 1 month - will do ACQ, feno and spiro at visit; depending on this will add further testing such as IgE etc., or CT chest  - in interim if worse, cal [place] or make urgent visit with [place] or go to ER   Current Outpatient Prescriptions:  .  albuterol (PROVENTIL HFA;VENTOLIN HFA) 108 (90 Base) MCG/ACT inhaler, Inhale 1-2 puffs into the lungs every 6 (six) hours as needed for wheezing or shortness of breath., Disp: , Rfl:  .  EPIPEN 2-PAK 0.3 MG/0.3ML SOAJ injection, as directed., Disp: , Rfl: 1 .  fluticasone furoate-vilanterol (BREO ELLIPTA) 200-25 MCG/INH AEPB, Inhale 1 puff into the lungs daily., Disp: , Rfl:  .  levocetirizine (XYZAL) 5 MG tablet, Take 5 mg by mouth every evening., Disp: , Rfl:  .  mometasone (ELOCON) 0.1 % ointment, Apply 1 application topically daily. Reported on 05/17/2016, Disp: , Rfl:  .  mometasone (NASONEX) 50 MCG/ACT nasal spray, Place 2 sprays into the nose 2 (two) times daily. Reported on 05/17/2016, Disp: , Rfl:  .  montelukast (SINGULAIR) 10 MG tablet, Take 10 mg by mouth. Reported on 03/15/2016, Disp: , Rfl:  .  predniSONE (STERAPRED UNI-PAK 21 TAB) 10 MG (21) TBPK tablet, See admin instructions., Disp: , Rfl: 0 .  sildenafil (VIAGRA) 100 MG tablet, Take 0.5-1 tablets (50-100 mg total) by mouth daily as needed for erectile dysfunction., Disp: 5 tablet, Rfl: 11 .  terbinafine (LAMISIL) 250 MG tablet, Take 1 tablet (250 mg total) by mouth daily., Disp: 30 tablet, Rfl: 2

## 2016-06-20 MED ORDER — TIOTROPIUM BROMIDE MONOHYDRATE 2.5 MCG/ACT IN AERS: 2.0000 | INHALATION_SPRAY | Freq: Every day | RESPIRATORY_TRACT | 1 refills | Status: DC

## 2016-06-20 NOTE — Telephone Encounter (Signed)
Patient returned call, CB is [phone].

## 2016-06-20 NOTE — Telephone Encounter (Signed)
Spoke with the pt and notified of results/recs per MR  He verbalized understanding and nothing further needed  Spiriva was sent to pharm and rov scheduled

## 2016-06-28 ENCOUNTER — Other Ambulatory Visit: Payer: 59

## 2016-06-29 ENCOUNTER — Other Ambulatory Visit: Payer: 59

## 2016-06-29 DIAGNOSIS — Z5181 Encounter for therapeutic drug level monitoring: Secondary | ICD-10-CM

## 2016-06-29 LAB — HEPATIC FUNCTION PANEL
ALT: 26 U/L (ref 9–46)
AST: 24 U/L (ref 10–40)
Albumin: 4.2 g/dL (ref 3.6–5.1)
Alkaline Phosphatase: 46 U/L (ref 40–115)
Bilirubin, Direct: 0.1 mg/dL (ref <=0.2)
Indirect Bilirubin: 0.6 mg/dL (ref 0.2–1.2)
Total Bilirubin: 0.7 mg/dL (ref 0.2–1.2)
Total Protein: 6.6 g/dL (ref 6.1–8.1)

## 2016-07-27 ENCOUNTER — Ambulatory Visit (INDEPENDENT_AMBULATORY_CARE_PROVIDER_SITE_OTHER): Payer: 59 | Admitting: Adult Health

## 2016-07-27 ENCOUNTER — Ambulatory Visit (INDEPENDENT_AMBULATORY_CARE_PROVIDER_SITE_OTHER)
Admission: RE | Admit: 2016-07-27 | Discharge: 2016-07-27 | Disposition: A | Discharge status: Home / Self Care | Payer: 59 | Source: Ambulatory Visit | Attending: Adult Health | Admitting: Adult Health

## 2016-07-27 ENCOUNTER — Encounter: Payer: Self-pay | Admitting: Adult Health

## 2016-07-27 ENCOUNTER — Other Ambulatory Visit (INDEPENDENT_AMBULATORY_CARE_PROVIDER_SITE_OTHER): Payer: 59

## 2016-07-27 DIAGNOSIS — J4551 Severe persistent asthma with (acute) exacerbation: Secondary | ICD-10-CM

## 2016-07-27 LAB — CBC WITH DIFFERENTIAL/PLATELET
BASOS ABS: 0.1 10*3/uL (ref 0.0–0.1)
Basophils Relative: 0.5 % (ref 0.0–3.0)
EOS ABS: 1.9 10*3/uL — AB (ref 0.0–0.7)
Eosinophils Relative: 19.9 % — ABNORMAL HIGH (ref 0.0–5.0)
HEMATOCRIT: 48.3 % (ref 39.0–52.0)
HEMOGLOBIN: 16.5 g/dL (ref 13.0–17.0)
LYMPHS PCT: 27.3 % (ref 12.0–46.0)
Lymphs Abs: 2.6 10*3/uL (ref 0.7–4.0)
MCHC: 34.2 g/dL (ref 30.0–36.0)
MCV: 89 fl (ref 78.0–100.0)
Monocytes Absolute: 1 10*3/uL (ref 0.1–1.0)
Monocytes Relative: 11.1 % (ref 3.0–12.0)
NEUTROS ABS: 3.9 10*3/uL (ref 1.4–7.7)
Neutrophils Relative %: 41.2 % — ABNORMAL LOW (ref 43.0–77.0)
PLATELETS: 271 10*3/uL (ref 150.0–400.0)
RBC: 5.42 Mil/uL (ref 4.22–5.81)
RDW: 12.9 % (ref 11.5–15.5)
WBC: 9.4 10*3/uL (ref 4.0–10.5)

## 2016-07-27 LAB — NITRIC OXIDE: NITRIC OXIDE: 87

## 2016-07-27 LAB — SEDIMENTATION RATE: Sed Rate: 6 mm/hr (ref 0–15)

## 2016-07-27 MED ORDER — PREDNISONE 10 MG PO TABS: ORAL_TABLET | ORAL | 0 refills | Status: DC

## 2016-07-27 NOTE — Addendum Note (Signed)
Addended by: Osa Craver on: 07/27/2016 04:56 PM   Modules accepted: Orders

## 2016-07-27 NOTE — Patient Instructions (Addendum)
Chest xray and Labs today .  Begin Prednisone taper over next week.  Continue on Spiriva and BREO .  Follow up Dr. Chase Caller in 3-4 weeks and As needed   Please contact office for sooner follow up if symptoms do not improve or worsen or seek emergency care

## 2016-07-27 NOTE — Assessment & Plan Note (Signed)
Severe persistent Asthma with recurrent exacerbations  Check IgE /RAST , eosinophils  Check cxr  Begin steroids .  Cont on controllers with BREO and Spiriva  Plan  Patient Instructions  Chest xray and Labs today .  Begin Prednisone taper over next week.  Continue on Spiriva and BREO .  Follow up Dr. Chase Caller in 3-4 weeks and As needed   Please contact office for sooner follow up if symptoms do not improve or worsen or seek emergency care

## 2016-07-27 NOTE — Progress Notes (Signed)
Subjective:    Patient ID: Seth Kerr, male    DOB: August 02, 1971, 45 y.o.   MRN: [id number]  HPI 45 yo male Never smoker seen for pulmonary consult 06/12/2016 for shortness of breath and asthma evaluation.   PFT 06/15/16  showed an FEV1 at 93%, ratio 83, FVC 88%, no significant bronchodilator response. Positive mid flow reversibility. DLCO 129%  07/27/2016 Acute OV  : Asthma  Patient returns for an acute office visit. Complains for last 1.5 weeks that he has dry cough , wheezing, tightness , and dyspnea. Increased SABA use.  Patient was seen for pulmonary consult 06/15/2016 for asthma evaluation. Patient was having active asthma flare and was on prednisone taper. Marland Kitchen He was set up for a pulmonary function test on 06/15/2016 that showed significant improvement in lung function with an FEV1 at 93%, ratio 83% and no significant bronchodilator response. Previous spirometry had showed significant airflow obstructions with FEV1 ranging from 35-53%. It was felt that his lung function improved because of prednisone c/w asthma . He was being followed by Dr. Donneta Romberg , Allergist. He was previously on allergy vaccines but was stopped due to persistent asthma flares. Says he had no breathing problems until he moved here from [facility] 6 yrs ago. Previously ran marathons .  Exhaled nitric oxide test was 26. Previously had been 53. Today FENO is 87  Chart review shows very high eosinophils . CXR in April 2017 showed LLL nodular focus ? Pna .  Follow up cxr showed improvement in June.  Spiriva was added to his BREO last ov.     Past Medical History:  Diagnosis Date  . Allergic rhinitis, cause unspecified    immunotherapy x 50yrs (stopped end of 2015), per Dr. Donneta Romberg  . Asthma   . Erectile dysfunction   . Irritable bowel syndrome (IBS)    has had 2 colonoscopies  . Onychomycosis    s/p lamisil x 2 courses   Current Outpatient Prescriptions on File Prior to Visit  Medication Sig Dispense Refill  . EPIPEN  2-PAK 0.3 MG/0.3ML SOAJ injection as directed.  1  . fluticasone furoate-vilanterol (BREO ELLIPTA) 200-25 MCG/INH AEPB Inhale 1 puff into the lungs daily.    Marland Kitchen levocetirizine (XYZAL) 5 MG tablet Take 5 mg by mouth every evening.    . montelukast (SINGULAIR) 10 MG tablet Take 10 mg by mouth. Reported on 03/15/2016    . terbinafine (LAMISIL) 250 MG tablet Take 1 tablet (250 mg total) by mouth daily. 30 tablet 2  . Tiotropium Bromide Monohydrate (SPIRIVA RESPIMAT) 2.5 MCG/ACT AERS Inhale 2 puffs into the lungs daily. 1 Inhaler 1  . mometasone (ELOCON) 0.1 % ointment Apply 1 application topically daily. Reported on 05/17/2016    . mometasone (NASONEX) 50 MCG/ACT nasal spray Place 2 sprays into the nose 2 (two) times daily. Reported on 05/17/2016    . sildenafil (VIAGRA) 100 MG tablet Take 0.5-1 tablets (50-100 mg total) by mouth daily as needed for erectile dysfunction. 5 tablet 11   No current facility-administered medications on file prior to visit.      Review of Systems .Constitutional:   No  weight loss, night sweats,  Fevers, chills, fatigue, or  lassitude.  HEENT:   No headaches,  Difficulty swallowing,  Tooth/dental problems, or  Sore throat,                No sneezing, itching, ear ache,  +nasal congestion, post nasal drip,   CV:  No chest pain,  Orthopnea, PND, swelling in lower extremities, anasarca, dizziness, palpitations, syncope.   GI  No heartburn, indigestion, abdominal pain, nausea, vomiting, diarrhea, change in bowel habits, loss of appetite, bloody stools.   Resp:   No chest wall deformity  Skin: no rash or lesions.  GU: no dysuria, change in color of urine, no urgency or frequency.  No flank pain, no hematuria   MS:  No joint pain or swelling.  No decreased range of motion.  No back pain.  Psych:  No change in mood or affect. No depression or anxiety.  No memory loss.         Objective:   Physical Exam Vitals:   07/27/16 1551  BP: 124/80  Pulse: 73  Temp:  97.8 F (36.6 C)  TempSrc: Oral  SpO2: 94%  Weight: 199 lb (90.3 kg)  Height: 5\' 11"  (1.803 m)    GEN: A/Ox3; pleasant , NAD, well nourished    HEENT:  [place]/AT,  EACs-clear, TMs-wnl, NOSE-clear, THROAT-clear, no lesions, no postnasal drip or exudate noted.   NECK:  Supple w/ fair ROM; no JVD; normal carotid impulses w/o bruits; no thyromegaly or nodules palpated; no lymphadenopathy.    RESP  Faint exp wheezing  no accessory muscle use, no dullness to percussion  CARD:  RRR, no m/r/g  , no peripheral edema, pulses intact, no cyanosis or clubbing.  GI:   Soft & nt; nml bowel sounds; no organomegaly or masses detected.   Musco: Warm bil, no deformities or joint swelling noted.   Neuro: alert, no focal deficits noted.    Skin: Warm, no lesions or rashes  Zebediah Beezley NP-C   Pulmonary and Critical Care  07/27/2016

## 2016-07-28 LAB — RESPIRATORY ALLERGY PROFILE REGION II ~~LOC~~
ALLERGEN, A. ALTERNATA, M6: 2.2 kU/L — AB
ASPERGILLUS FUMIGATUS M3: 0.21 kU/L — AB
Allergen, C. Herbarum, M2: <0.1 kU/L
Allergen, Comm Silver Birch, t9: <0.1 kU/L
Allergen, D pternoyssinus,d7: <0.1 kU/L
Allergen, Mouse Urine Protein, e78: <0.1 kU/L
Allergen, Oak,t7: <0.1 kU/L
Allergen, P. notatum, m1: <0.1 kU/L
BERMUDA GRASS: 0.59 kU/L — AB
Common Ragweed: <0.1 kU/L
D. farinae: <0.1 kU/L
DOG DANDER: 0.87 kU/L — AB
ELM IGE: 0.11 kU/L — AB
IGE (IMMUNOGLOBULIN E), SERUM: 254 kU/L — AB (ref <115)
Johnson Grass: 0.36 kU/L — ABNORMAL HIGH
Pecan/Hickory Tree IgE: <0.1 kU/L
Rough Pigweed  IgE: 0.19 kU/L — ABNORMAL HIGH
Sheep Sorrel IgE: <0.1 kU/L
Timothy Grass: 2.39 kU/L — ABNORMAL HIGH

## 2016-07-31 ENCOUNTER — Ambulatory Visit: Payer: 59 | Admitting: Internal Medicine

## 2016-08-02 NOTE — Progress Notes (Signed)
lmtcb x1 

## 2016-08-03 ENCOUNTER — Telehealth: Payer: Self-pay | Admitting: Adult Health

## 2016-08-03 DIAGNOSIS — J4551 Severe persistent asthma with (acute) exacerbation: Secondary | ICD-10-CM

## 2016-08-03 NOTE — Telephone Encounter (Signed)
Per lab result note: Notes Recorded by Melvenia Needles, NP on 08/01/2016 at 11:31 AM EDT Eosinophils remain very high , this could have caused his previous PNA  Need repeat CBC w/ diff on return in 2 weeks as planned with Dr. Chase Caller  Cont on prednisone  Allergy test shows + grass, tree , dog with elevated IgE  Will discuss on return  Please contact office for sooner follow up if symptoms do not improve or worsen or seek emergency care  Make sure he keep follow up .  Per CXR results: Melvenia Needles, NP  Osa Craver, CMA        Area in LLL persists - suspect this is Post infectious scarring from PNA  Set up for CT chest w/ contrast In next 1-2 weeks  Ov with Dr. Chase Caller as planned 10/4 to discuss in detail .  Please contact office for sooner follow up if symptoms do not improve or worsen or seek emergency care   ---  Called spoke with pt and is aware of results. CT ordered. Pt already has pending appt with MR on 10/4. Will have labs done then. nothing further needed

## 2016-08-15 ENCOUNTER — Ambulatory Visit (INDEPENDENT_AMBULATORY_CARE_PROVIDER_SITE_OTHER)
Admission: RE | Admit: 2016-08-15 | Discharge: 2016-08-15 | Disposition: A | Discharge status: Home / Self Care | Payer: 59 | Source: Ambulatory Visit | Attending: Adult Health | Admitting: Adult Health

## 2016-08-15 DIAGNOSIS — J4551 Severe persistent asthma with (acute) exacerbation: Secondary | ICD-10-CM

## 2016-08-15 MED ORDER — IOPAMIDOL (ISOVUE-300) INJECTION 61%
80.0000 mL | Freq: Once | INTRAVENOUS | Status: AC | PRN
  Administered 2016-08-15: 80 mL via INTRAVENOUS

## 2016-08-16 ENCOUNTER — Telehealth: Payer: Self-pay | Admitting: Adult Health

## 2016-08-16 ENCOUNTER — Encounter: Payer: Self-pay | Admitting: Adult Health

## 2016-08-16 ENCOUNTER — Ambulatory Visit (INDEPENDENT_AMBULATORY_CARE_PROVIDER_SITE_OTHER): Payer: 59 | Admitting: Adult Health

## 2016-08-16 VITALS — BP 108/78 | HR 57 | Temp 97.6°F | Ht 71.0 in | Wt 200.0 lb

## 2016-08-16 DIAGNOSIS — R05 Cough: Secondary | ICD-10-CM | POA: Diagnosis not present

## 2016-08-16 DIAGNOSIS — J4551 Severe persistent asthma with (acute) exacerbation: Secondary | ICD-10-CM

## 2016-08-16 DIAGNOSIS — R059 Cough, unspecified: Secondary | ICD-10-CM

## 2016-08-16 MED ORDER — PREDNISONE 10 MG PO TABS: ORAL_TABLET | ORAL | 0 refills | Status: DC

## 2016-08-16 MED ORDER — HYDROCODONE-HOMATROPINE 5-1.5 MG/5ML PO SYRP: 5.0000 mL | ORAL_SOLUTION | Freq: Four times a day (QID) | ORAL | 0 refills | Status: DC | PRN

## 2016-08-16 MED ORDER — ALBUTEROL SULFATE (2.5 MG/3ML) 0.083% IN NEBU: 2.5000 mg | INHALATION_SOLUTION | Freq: Four times a day (QID) | RESPIRATORY_TRACT | 3 refills | Status: DC | PRN

## 2016-08-16 MED ORDER — METHYLPREDNISOLONE ACETATE 80 MG/ML IJ SUSP
120.0000 mg | Freq: Once | INTRAMUSCULAR | Status: AC
  Administered 2016-08-16: 120 mg via INTRAMUSCULAR

## 2016-08-16 MED ORDER — PROAIR RESPICLICK 108 (90 BASE) MCG/ACT IN AEPB: 2.0000 | INHALATION_SPRAY | RESPIRATORY_TRACT | 3 refills | Status: DC | PRN

## 2016-08-16 NOTE — Progress Notes (Signed)
Subjective:    Patient ID: Seth Kerr, male    DOB: 1971-01-13, 45 y.o.   MRN: [id number]  HPI 45 yo male Never smoker seen for pulmonary consult 06/12/2016 for shortness of breath and asthma evaluation.   PFT 06/15/16  showed an FEV1 at 93%, ratio 83, FVC 88%, no significant bronchodilator response. Positive mid flow reversibility. DLCO 129%  08/16/2016 Acute OV p  : Asthma  Patient returns for an acute office visit for persistent wheezing and cough .  Seen  2 weeks ago for asthma flare. Started on Steroid taper .  Felt some better but never stopped wheezing.  Labs showed IgE at, 254 + rast-tree/grass.  , High eosinophils on Diff , FeNO was elevated at 87  CXR showed mild opacity at left lung base.  CT chest done polylobar areas of scarring likely from previous pna, mild nodularity along lingula ? Scarring. No ILD changes.  He remains on BREO , Spiriva, Singulair , Xyzal and Nasonex .  Cough is keeping him up at night . Denies fever, chest pain, orthopnea , edema or hemoptysis, or discolored mucus.   Patient was seen for pulmonary consult 06/15/2016 for asthma evaluation. Patient was having active asthma flare and was on prednisone taper. Marland Kitchen He was set up for a pulmonary function test on 06/15/2016 that showed significant improvement in lung function with an FEV1 at 93%, ratio 83% and no significant bronchodilator response. Previous spirometry had showed significant airflow obstructions with FEV1 ranging from 35-53%. It was felt that his lung function improved because of prednisone c/w asthma . He was being followed by Dr. Donneta Kerr , Allergist. He was previously on allergy vaccines but was stopped due to persistent asthma flares. Says he had no breathing problems until he moved here from [facility] 6 yrs ago. Previously ran marathons .     Past Medical History:  Diagnosis Date  . Allergic rhinitis, cause unspecified    immunotherapy x 36yrs (stopped end of 2015), per Dr. Donneta Kerr  . Asthma   .  Erectile dysfunction   . Irritable bowel syndrome (IBS)    has had 2 colonoscopies  . Onychomycosis    s/p lamisil x 2 courses   Current Outpatient Prescriptions on File Prior to Visit  Medication Sig Dispense Refill  . EPIPEN 2-PAK 0.3 MG/0.3ML SOAJ injection as directed.  1  . fluticasone furoate-vilanterol (BREO ELLIPTA) 200-25 MCG/INH AEPB Inhale 1 puff into the lungs daily.    Marland Kitchen levocetirizine (XYZAL) 5 MG tablet Take 5 mg by mouth every evening.    . mometasone (ELOCON) 0.1 % ointment Apply 1 application topically daily. Reported on 05/17/2016    . mometasone (NASONEX) 50 MCG/ACT nasal spray Place 2 sprays into the nose 2 (two) times daily. Reported on 05/17/2016    . montelukast (SINGULAIR) 10 MG tablet Take 10 mg by mouth. Reported on 03/15/2016    . PROAIR RESPICLICK 123XX123 (90 Base) MCG/ACT AEPB INHALE 1 TO 2 PUFFS EVERY 4 TO 6 HOURS AS NEEDED FOR COUGH OR WHEEZE  0  . sildenafil (VIAGRA) 100 MG tablet Take 0.5-1 tablets (50-100 mg total) by mouth daily as needed for erectile dysfunction. 5 tablet 11  . terbinafine (LAMISIL) 250 MG tablet Take 1 tablet (250 mg total) by mouth daily. 30 tablet 2  . Tiotropium Bromide Monohydrate (SPIRIVA RESPIMAT) 2.5 MCG/ACT AERS Inhale 2 puffs into the lungs daily. 1 Inhaler 1   No current facility-administered medications on file prior to visit.  Review of Systems .Constitutional:   No  weight loss, night sweats,  Fevers, chills, fatigue, or  lassitude.  HEENT:   No headaches,  Difficulty swallowing,  Tooth/dental problems, or  Sore throat,                No sneezing, itching, ear ache,  +nasal congestion, post nasal drip,   CV:  No chest pain,  Orthopnea, PND, swelling in lower extremities, anasarca, dizziness, palpitations, syncope.   GI  No heartburn, indigestion, abdominal pain, nausea, vomiting, diarrhea, change in bowel habits, loss of appetite, bloody stools.   Resp:   No chest wall deformity  Skin: no rash or lesions.  GU: no  dysuria, change in color of urine, no urgency or frequency.  No flank pain, no hematuria   MS:  No joint pain or swelling.  No decreased range of motion.  No back pain.  Psych:  No change in mood or affect. No depression or anxiety.  No memory loss.         Objective:   Physical Exam Vitals:   08/16/16 1204  BP: 108/78  Pulse: (!) 57  Temp: 97.6 F (36.4 C)  TempSrc: Oral  SpO2: 94%  Weight: 200 lb (90.7 kg)  Height: 5\' 11"  (1.803 m)    GEN: A/Ox3; pleasant , NAD, well nourished    HEENT:  Lucas/AT,  EACs-clear, TMs-wnl, NOSE-clear, THROAT-clear, no lesions, no postnasal drip or exudate noted.   NECK:  Supple w/ fair ROM; no JVD; normal carotid impulses w/o bruits; no thyromegaly or nodules palpated; no lymphadenopathy.  No stridor   RESP  exp wheezing  no accessory muscle use, no dullness to percussion Speaks in full sentences   CARD:  RRR, no m/r/g  , no peripheral edema, pulses intact, no cyanosis or clubbing.  GI:   Soft & nt; nml bowel sounds; no organomegaly or masses detected.   Musco: Warm bil, no deformities or joint swelling noted.   Neuro: alert, no focal deficits noted.    Skin: Warm, no lesions or rashes  Camyah Pultz NP-C  [facility]  08/16/2016

## 2016-08-16 NOTE — Telephone Encounter (Signed)
Spoke with TP Okay to send albuterol neb to pharmacy Discuss with pt if neb machine is needed  Called spoke with pt. He states that he has been using his daughter's neb machine and expired albuterol. He verified pharmacy as [facility] on [address]. Rx sent. Nothing further needed.

## 2016-08-16 NOTE — Progress Notes (Signed)
Called spoke with pt. Reviewed results and recs. Pt voiced understanding and had no further questions.

## 2016-08-16 NOTE — Patient Instructions (Addendum)
Begin Prednisone taper over next week.  Continue on Spiriva and BREO .  Continue on Xyzal and Nasonex.  Depo Medrol shot today .  Delsym 2 tsp Twice daily , As needed  Cough .  Hydromet 1 tsp every 12hr As needed  Cough , may make you sleepy.  Follow up Dr. Chase Caller in 1 week  And as needed   Please contact office for sooner follow up if symptoms do not improve or worsen or seek emergency care

## 2016-08-17 NOTE — Assessment & Plan Note (Signed)
Slow to resolve asthma exacerbation on maximum maintenance med regimen  He has high eosinophils , may benefit from evaluation for injectables -Cinqair , nucala .   Plan  Patient Instructions  Begin Prednisone taper over next week.  Continue on Spiriva and BREO .  Continue on Xyzal and Nasonex.  Depo Medrol shot today .  Delsym 2 tsp Twice daily , As needed  Cough .  Hydromet 1 tsp every 12hr As needed  Cough , may make you sleepy.  Follow up Dr. Chase Caller in 1 week  And as needed   Please contact office for sooner follow up if symptoms do not improve or worsen or seek emergency care

## 2016-08-23 ENCOUNTER — Encounter: Payer: Self-pay | Admitting: Internal Medicine

## 2016-08-23 ENCOUNTER — Telehealth: Payer: Self-pay | Admitting: Internal Medicine

## 2016-08-23 ENCOUNTER — Ambulatory Visit (INDEPENDENT_AMBULATORY_CARE_PROVIDER_SITE_OTHER): Payer: 59 | Admitting: Internal Medicine

## 2016-08-23 VITALS — BP 112/78 | HR 59 | Ht 71.0 in | Wt 204.0 lb

## 2016-08-23 DIAGNOSIS — J45901 Unspecified asthma with (acute) exacerbation: Secondary | ICD-10-CM | POA: Diagnosis not present

## 2016-08-23 DIAGNOSIS — D721 Eosinophilia, unspecified: Secondary | ICD-10-CM | POA: Insufficient documentation

## 2016-08-23 DIAGNOSIS — R7689 Other specified abnormal immunological findings in serum: Secondary | ICD-10-CM | POA: Insufficient documentation

## 2016-08-23 DIAGNOSIS — J4540 Moderate persistent asthma, uncomplicated: Secondary | ICD-10-CM | POA: Insufficient documentation

## 2016-08-23 DIAGNOSIS — R768 Other specified abnormal immunological findings in serum: Secondary | ICD-10-CM | POA: Diagnosis not present

## 2016-08-23 LAB — NITRIC OXIDE: NITRIC OXIDE: 80

## 2016-08-23 MED ORDER — AZITHROMYCIN 250 MG PO TABS: 250.0000 mg | ORAL_TABLET | ORAL | 0 refills | Status: DC

## 2016-08-23 MED ORDER — METHYLPREDNISOLONE 4 MG PO TBPK: ORAL_TABLET | ORAL | 0 refills | Status: DC

## 2016-08-23 NOTE — Progress Notes (Signed)
Subjective:     Patient ID: Seth Kerr, male   DOB: October 21, 1971, 45 y.o.   MRN: [id number]  HPI    45 yo male Never smoker seen for pulmonary consult 06/12/2016 for shortness of breath and asthma evaluation.    IOV 06/12/2016  Chief Complaint  Patient presents with  . PULMONARY CONSULT    Referred by Dr. Mosetta Anis. Pt reports increasing breathing issues since moving to [place] 6 years ago. Pt had recent pna in April. Pt c/o continued SOB and chest tightness that is worse with exertion and some cough at night. Pt denies cough. Pt is on Breo and albuterol HFA. Pt is also on oral prednisone taper currently. Pt has significant second hand smoke exposure. Pt is on allergy injections weekly.     45 year old male works at [employer]. He is been referred by Dr. Donneta Romberg for evaluation of shortness of breath in the setting of confirmed diagnosis of asthma and allergies  History is gained from talking to the patient and also review of the referral notes. He tells me that he worked in the [employer] area. When he lived there he had no prior diagnosis of asthma respirator complaints. Moved to [facility] approximately 6 years ago and then within several months started noticing insidious onset of shortness of breath and chest tightness. For the last 4-5 years he has been seeing Dr. Donneta Romberg with a confirmed diagnosis of asthma. He was then started on inhaled steroids and Singulair. With this he also had allergy testing which was positive. He says once his treatments commenced he was able to return to normal activities of daily living including running long distances. He felt well. Approximately one year ago he stopped his allergy shots. Then in April 2017 he says he had a pneumonia for which she was treated with antibiotics. There is a chest x-ray report from 03/08/2016 that reports a nodular focus in the left lower lobe which could her percent focus of pneumonia but could not exclude a  pulmonary nodule. I personally visualized this film and agree. He had follow-up chest x-ray 05/05/2016 that showed improvement. He says that he was beginning to feel better but then started having nocturnal symptoms again with shortness of breath. Shortness of breath is exertional. His been going on for the last few weeks. He is now unable to climb stairs without feeling shortness of breath. Currently is in the middle of a prednisone taper  In terms of his allergies: He had repeat allergy testing April 2017 and it is positive for different environmental allergens. He has now been restarted on allergy shots.  He has had spirometry with Dr. Donneta Romberg. In 07/21/2015 FEV1 1.44 L/35% with a ratio 68 showing severe obstruction. Follow-up 03/08/2016 on the time of pneumonia FEV1 2.2 L/53% with a ratio of 79 with a 13% bronchodilator response. Again showing obstruction of moderate degree. He had a repeat spirometry 03/17/2016 which is only prebronchodilator that showed further improvement FEV1 2.68 L/64% ratio 79. And then again 06/05/2016 he had no further improvement with a post bronchodilator response FEV1 of 2.5 L/61 ratio 80 which is a 15% bronchi were to response. Exhaled nitric oxide on the same day was elevated at 59 ppb. Due to obstructive nature of the spirometry is been referred here.  Walking desaturation test on an 85 feet 3 laps on room at: Did not desaturate below 97%. NOTED BRADYCARDIA - HR 60s entire walk- walked fast  Exhaled nitric oxide today in  our office: 26 ppb and normal (was 59 on 06/05/16 @ Dr Donneta Romberg in office )   Noted: he is in middle of a 12d prednisone taper - 5 days left    has a past medical history of Allergic rhinitis, cause unspecified; Asthma; Erectile dysfunction; Irritable bowel syndrome (IBS); and Onychomycosis.   reports that he has never smoked. He has never used smokeless tobacco.   PFT 06/15/16  showed an FEV1 at 93%, ratio 83, FVC 88%, no significant bronchodilator  response. Positive mid flow reversibility. DLCO 129%     07/27/2016 Acute OV  : Asthma  Patient returns for an acute office visit. Complains for last 1.5 weeks that he has dry cough , wheezing, tightness , and dyspnea. Increased SABA use.  Patient was seen for pulmonary consult 06/15/2016 for asthma evaluation. Patient was having active asthma flare and was on prednisone taper. Marland Kitchen He was set up for a pulmonary function test on 06/15/2016 that showed significant improvement in lung function with an FEV1 at 93%, ratio 83% and no significant bronchodilator response. Previous spirometry had showed significant airflow obstructions with FEV1 ranging from 35-53%. It was felt that his lung function improved because of prednisone c/w asthma . He was being followed by Dr. Donneta Romberg , Allergist. He was previously on allergy vaccines but was stopped due to persistent asthma flares. Says he had no breathing problems until he moved here from [facility] 6 yrs ago. Previously ran marathons .  Exhaled nitric oxide test was 26. Previously had been 78. Today FENO is 87  - 07/27/16 Chart review shows very high eosinophils . CXR in April 2017 showed LLL nodular focus ? Pna .  Follow up cxr showed improvement in June.  Spiriva was added to his BREO last ov.    08/16/2016 Acute OV p  : Asthma  Patient returns for an acute office visit for persistent wheezing and cough .  Seen  2 weeks ago for asthma flare. Started on Steroid taper .  Felt some better but never stopped wheezing.  Labs showed IgE at, 254 + rast-tree/grass.  , High eosinophils on Diff , FeNO was elevated at 87  CXR showed mild opacity at left lung base.  CT chest done polylobar areas of scarring likely from previous pna, mild nodularity along lingula ? Scarring. No ILD changes.  He remains on BREO , Spiriva, Singulair , Xyzal and Nasonex .  Cough is keeping him up at night . Denies fever, chest pain, orthopnea , edema or hemoptysis, or discolored mucus.    Patient was seen for pulmonary consult 06/15/2016 for asthma evaluation. Patient was having active asthma flare and was on prednisone taper. Marland Kitchen He was set up for a pulmonary function test on 06/15/2016 that showed significant improvement in lung function with an FEV1 at 93%, ratio 83% and no significant bronchodilator response. Previous spirometry had showed significant airflow obstructions with FEV1 ranging from 35-53%. It was felt that his lung function improved because of prednisone c/w asthma . He was being followed by Dr. Donneta Romberg , Allergist. He was previously on allergy vaccines but was stopped due to persistent asthma flares. Says he had no breathing problems until he moved here from [facility] 6 yrs ago. Previously ran marathons .     OV 08/23/2016  Chief Complaint  Patient presents with  . 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 Since then he had a CT chest and allergy evaluation. What is significant is that he is significantly high used to this. CT chest is clean. He's not had CT sinus but he does not have sinus problems. His exhaled nitric oxide has been elevated during exacerbations. He saw nurse practitioner in the interim early September 2017 was given prednisone. When he came back again in late September 2017 which was one week ago with another exacerbation and again given prednisone. Visit lasted prednisone and is here for follow-up. He feels that he is largely better. He is worried that he needs prednisone repeatedly. He is not sure that he can manage when he comes off the prednisone but he is not a try. His daughter has similar problems after moving from [place] to [place] and she is on Xolair and has good control. Therefore he is questions on this. He says he is  compliant with his inhalers.  Asthma control question test: This is a 4 week questionnaire but I asked him how he felt in the last 2 days. He feels most of the time his asthma prevents him from getting his work done.Marland Kitchen He feels dyspneic more than once a day. He wakes up frequently at night. He is using albuterol rescue one or 2 times daily. He feels his asthma is poorly controlled despite d6 of prednisone and chest very tight   Exhaled nitric oxide today: While on prednisone 6th day is elevated at 80pbo      has a past medical history of Allergic rhinitis, cause unspecified; Asthma; Erectile dysfunction; Irritable bowel syndrome (IBS); and Onychomycosis.   reports that he has never smoked. He has never used smokeless tobacco.  Past Surgical History:  Procedure Laterality Date  . APPENDECTOMY  age 78  . SEPTOPLASTY  approx age 47  . SHOULDER SURGERY Right     Allergies  Allergen Reactions  . Aspirin Other (See Comments)    Not actual allergy, had chronic nosebleeds as a child and was instructed not to use aspirin.    Immunization History  Administered Date(s) Administered  . Influenza Split 09/12/2012, 08/29/2014  . Influenza,inj,Quad PF,36+ Mos 10/09/2013, 09/03/2015  . Pneumococcal Polysaccharide-23 05/05/2015  . Tdap 04/22/2014    Family History  Problem Relation Age of Onset  . Cancer Mother     lung cancer, smoker  . Alcohol abuse Mother   . Cancer Father     stomach cancer, smoker  . Alcohol abuse Father   . Asthma Daughter   . Diabetes Neg Hx   . Heart disease Neg Hx      Current Outpatient Prescriptions:  .  albuterol (PROVENTIL) (2.5 MG/3ML) 0.083% nebulizer solution, Take 3 mLs (2.5 mg total) by nebulization every 6 (six) hours as needed for wheezing or shortness of breath., Disp: 75 mL, Rfl: 3 .  EPIPEN 2-PAK 0.3 MG/0.3ML SOAJ injection, as directed., Disp: , Rfl: 1 .  fluticasone furoate-vilanterol (BREO ELLIPTA) 200-25 MCG/INH AEPB, Inhale 1 puff into  the lungs daily., Disp: , Rfl:  .  HYDROcodone-homatropine (HYDROMET) 5-1.5 MG/5ML syrup, Take 5 mLs by mouth every 6 (six) hours as needed., Disp: 240 mL, Rfl: 0 .  levocetirizine (XYZAL) 5 MG tablet, Take 5 mg by mouth every evening., Disp: , Rfl:  .  mometasone (ELOCON) 0.1 % ointment, Apply 1 application topically daily. Reported on 05/17/2016, Disp: , Rfl:  .  montelukast (SINGULAIR) 10 MG tablet, Take 10 mg by mouth. Reported on 03/15/2016, Disp: , Rfl:  .  predniSONE (DELTASONE) 10 MG tablet, 4 tabs for 2 days, then 3 tabs for 2 days, 2 tabs for 2 days, then 1 tab for 2 days, then stop, Disp: 20 tablet, Rfl: 0 .  PROAIR RESPICLICK 123XX123 (90 Base) MCG/ACT AEPB, Inhale 2 puffs into the lungs every 4 (four) hours as needed., Disp: 1 each, Rfl: 3 .  sildenafil (VIAGRA) 100 MG tablet, Take 0.5-1 tablets (50-100 mg total) by mouth daily as needed for erectile dysfunction., Disp: 5 tablet, Rfl: 11 .  Tiotropium Bromide Monohydrate (SPIRIVA RESPIMAT) 2.5 MCG/ACT AERS, Inhale 2 puffs into the lungs daily., Disp: 1 Inhaler, Rfl: 1 .  mometasone (NASONEX) 50 MCG/ACT nasal spray, Place 2 sprays into the nose 2 (two) times daily. Reported on 05/17/2016, Disp: , Rfl:     Review of Systems     Objective:   Physical Exam  Constitutional: He is oriented to person, place, and time. He appears well-developed and well-nourished. No distress.  HENT:  Head: Normocephalic and atraumatic.  Right Ear: External ear normal.  Left Ear: External ear normal.  Mouth/Throat: Oropharynx is clear and moist. No oropharyngeal exudate.  Eyes: Conjunctivae and EOM are normal. Pupils are equal, round, and reactive to light. Right eye exhibits no discharge. Left eye exhibits no discharge. No scleral icterus.  Neck: Normal range of motion. Neck supple. No JVD present. No tracheal deviation present. No thyromegaly present.  Cardiovascular: Normal rate, regular rhythm and intact distal pulses.  Exam reveals no gallop and no  friction rub.   No murmur heard. Pulmonary/Chest: Effort normal and breath sounds normal. No respiratory distress. He has no wheezes. He has no rales. He exhibits no tenderness.  Abdominal: Soft. Bowel sounds are normal. He exhibits no distension and no mass. There is no tenderness. There is no rebound and no guarding.  Musculoskeletal: Normal range of motion. He exhibits no edema or tenderness.  Lymphadenopathy:    He has no cervical adenopathy.  Neurological: He is alert and oriented to person, place, and time. He has normal reflexes. No cranial nerve deficit. Coordination normal.  Skin: Skin is warm and dry. No rash noted. He is not diaphoretic. No erythema. No pallor.  Psychiatric: He has a normal mood and affect. His behavior is normal. Judgment and thought content normal.  Nursing note and vitals reviewed.  Vitals:   08/23/16 1525  BP: 112/78  Pulse: (!) 59  SpO2: 96%  Weight: 204 lb (92.5 kg)  Height: 5\' 11"  (1.803 m)  ;s   Assessment:       ICD-9-CM ICD-10-CM   1. Very poorly controlled moderate persistent asthma 493.90 J45.40 Alpha-1 antitrypsin phenotype     Nitric oxide  2. 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 Elevated IgE level 795.79 R76.8 Alpha-1 antitrypsin phenotype     Nitric oxide  4. Asthma with acute exacerbation, unspecified asthma severity, unspecified whether persistent 493.92 J45.901        Plan:     You have very poorly controlled asthma with need for frequent steroids Still in exacervation This is because of - Elevation in eosinophilia and IgE  - high eosinophils  is way more significant than elevatin in IgE  PLAN Do alpha 1 test 08/23/2016 Do feno test 08/23/2016 Do Z PAK Do medrol dose pak for 7 days  And see if steroid rotation helps Otherwise,  continue  BREO , Spiriva, Singulair , Xyzal and Nasonex .  Continue albuterol prn Make aplication for Nucala  If you need further steroids for attacks- we can  try medrol instead of prednisone  Followup  - flu shot next week  - rov 2 months or sooner if   Dr. Brand Males, M.D., [facility].C.P Pulmonary and Critical Care Medicine Staff Physician [facility] Pager: [phone], If no answer or between  15:00h - 7:00h: call [phone]  08/23/2016 4:17 PM

## 2016-08-23 NOTE — Telephone Encounter (Signed)
ZPAK was sent in before the patient left the office. This was on his AVS.  Nothing further needed.

## 2016-08-23 NOTE — Addendum Note (Signed)
Addended by: Virl Cagey on: 08/23/2016 04:33 PM   Modules accepted: Orders

## 2016-08-23 NOTE — Telephone Encounter (Signed)
Forgot to prescribe Z pak for him. Please call it in and let him know  Thanks  Dr. Brand Males, M.D., [facility].C.P Pulmonary and Critical Care Medicine Staff Physician [facility] Pager: [phone], If no answer or between  15:00h - 7:00h: call [phone]  08/23/2016 4:41 PM

## 2016-08-23 NOTE — Patient Instructions (Addendum)
ICD-9-CM ICD-10-CM   1. Very poorly controlled moderate persistent asthma 493.90 J45.40   2. 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 Elevated IgE level 795.79 R76.8    You have very poorly controlled asthma with need for frequent steroids Still in exacervation This is because of - Elevation in eosinophilia and IgE  - high eosinophils  is way more significant than elevatin in IgE  PLAN Do alpha 1 test 08/23/2016 Do feno test 08/23/2016 Do Z PAK Do medrol dose pak for 7 days  And see if steroid rotation helps Otherwise, continue  BREO , Spiriva, Singulair , Xyzal and Nasonex .  Continue albuterol prn Make aplication for Nucala  If you need further steroids for attacks- we can try medrol instead of prednisone  Followup  - flu shot next week  - rov 2 months or sooner if needed

## 2016-09-15 ENCOUNTER — Other Ambulatory Visit: Payer: Self-pay | Admitting: Internal Medicine

## 2016-09-18 ENCOUNTER — Telehealth: Payer: Self-pay | Admitting: Internal Medicine

## 2016-09-18 NOTE — Telephone Encounter (Signed)
Spoke with the pt and scheduled ov with TP for cough  I advised will forward msg to EE to check on [name], have you started paperwork for the pt's Nucala? Please advise thanks!

## 2016-09-19 ENCOUNTER — Encounter: Payer: Self-pay | Admitting: Adult Health

## 2016-09-19 ENCOUNTER — Ambulatory Visit (INDEPENDENT_AMBULATORY_CARE_PROVIDER_SITE_OTHER): Payer: 59 | Admitting: Adult Health

## 2016-09-19 VITALS — BP 120/76 | HR 68 | Temp 97.9°F | Ht 71.0 in | Wt 204.2 lb

## 2016-09-19 DIAGNOSIS — J4541 Moderate persistent asthma with (acute) exacerbation: Secondary | ICD-10-CM | POA: Diagnosis not present

## 2016-09-19 DIAGNOSIS — J4540 Moderate persistent asthma, uncomplicated: Secondary | ICD-10-CM

## 2016-09-19 DIAGNOSIS — J45901 Unspecified asthma with (acute) exacerbation: Secondary | ICD-10-CM

## 2016-09-19 LAB — NITRIC OXIDE: Nitric Oxide: 56

## 2016-09-19 MED ORDER — MOMETASONE FUROATE 50 MCG/ACT NA SUSP: 2.0000 | Freq: Two times a day (BID) | NASAL | 4 refills | Status: DC

## 2016-09-19 MED ORDER — METHYLPREDNISOLONE 4 MG PO TBPK: ORAL_TABLET | ORAL | 0 refills | Status: DC

## 2016-09-19 NOTE — Telephone Encounter (Signed)
This has been started. The pt has been approved for financial assistance but a PA has not yet been received. Will hold in my box to follow up on for PA.

## 2016-09-19 NOTE — Progress Notes (Signed)
Subjective:    Patient ID: Seth Kerr, male    DOB: Dec 27, 1970, 45 y.o.   MRN: [id number]  HPI 45 yo male Never smoker seen for pulmonary consult 06/12/2016 for shortness of breath and asthma evaluation.   PFT 06/15/16  showed an FEV1 at 93%, ratio 83, FVC 88%, no significant bronchodilator response. Positive mid flow reversibility. DLCO 129% 07/27/16  IgE at, 254 + rast-tree/grass.  , High eosinophils on Diff , FeNO was elevated at 87  92017 CT chest done polylobar areas of scarring likely from previous pna, mild nodularity along lingula ? Scarring. No ILD changes.   09/19/2016 Acute OV p  : Asthma  Patient returns for an acute office visit for persistent wheezing and cough .  Seen  3 weeks ago for asthma flare. Started on medrol dose pack and zpack . Felt some better for couple of weeks then symptoms returned with dry cough and wheezing  Increased SABA use for last 2-3 days.  FeNO today is 56 . (no steroids for 2 weeks) .    He remains on BREO , Spiriva, Singulair , Xyzal and Nasonex .  Nucala paperwork has been started. Awaiting approval . We checked on process, waiting on PA .  Denies fever, chest pain, orthopnea , edema or hemoptysis, or discolored mucus.   Patient was seen for pulmonary consult 06/15/2016 for asthma evaluation. Patient was having active asthma flare and was on prednisone taper. Marland Kitchen He was set up for a pulmonary function test on 06/15/2016 that showed significant improvement in lung function with an FEV1 at 93%, ratio 83% and no significant bronchodilator response. Previous spirometry had showed significant airflow obstructions with FEV1 ranging from 35-53%. It was felt that his lung function improved because of prednisone c/w asthma . He was being followed by Dr. Donneta Romberg , Allergist. He was previously on allergy vaccines but was stopped due to persistent asthma flares. Says he had no breathing problems until he moved here from [facility] 6 yrs ago. Previously ran marathons  .     Past Medical History:  Diagnosis Date  . Allergic rhinitis, cause unspecified    immunotherapy x 76yrs (stopped end of 2015), per Dr. Donneta Romberg  . Asthma   . Erectile dysfunction   . Irritable bowel syndrome (IBS)    has had 2 colonoscopies  . Onychomycosis    s/p lamisil x 2 courses   Current Outpatient Prescriptions on File Prior to Visit  Medication Sig Dispense Refill  . albuterol (PROVENTIL) (2.5 MG/3ML) 0.083% nebulizer solution Take 3 mLs (2.5 mg total) by nebulization every 6 (six) hours as needed for wheezing or shortness of breath. 75 mL 3  . EPIPEN 2-PAK 0.3 MG/0.3ML SOAJ injection as directed.  1  . fluticasone furoate-vilanterol (BREO ELLIPTA) 200-25 MCG/INH AEPB Inhale 1 puff into the lungs daily.    Marland Kitchen HYDROcodone-homatropine (HYDROMET) 5-1.5 MG/5ML syrup Take 5 mLs by mouth every 6 (six) hours as needed. 240 mL 0  . levocetirizine (XYZAL) 5 MG tablet Take 5 mg by mouth every evening.    . mometasone (ELOCON) 0.1 % ointment Apply 1 application topically daily. Reported on 05/17/2016    . montelukast (SINGULAIR) 10 MG tablet Take 10 mg by mouth. Reported on 03/15/2016    . PROAIR RESPICLICK 123XX123 (90 Base) MCG/ACT AEPB Inhale 2 puffs into the lungs every 4 (four) hours as needed. 1 each 3  . sildenafil (VIAGRA) 100 MG tablet Take 0.5-1 tablets (50-100 mg total) by mouth daily as  needed for erectile dysfunction. 5 tablet 11  . SPIRIVA RESPIMAT 2.5 MCG/ACT AERS INHALE 2 PUFFS INTO THE LUNGS DAILY. 1 Inhaler 5  . mometasone (NASONEX) 50 MCG/ACT nasal spray Place 2 sprays into the nose 2 (two) times daily. Reported on 05/17/2016     No current facility-administered medications on file prior to visit.      Review of Systems .Constitutional:   No  weight loss, night sweats,  Fevers, chills, fatigue, or  lassitude.  HEENT:   No headaches,  Difficulty swallowing,  Tooth/dental problems, or  Sore throat,                No sneezing, itching, ear ache, nasal congestion, post nasal  drip,   CV:  No chest pain,  Orthopnea, PND, swelling in lower extremities, anasarca, dizziness, palpitations, syncope.   GI  No heartburn, indigestion, abdominal pain, nausea, vomiting, diarrhea, change in bowel habits, loss of appetite, bloody stools.   Resp:   No chest wall deformity  Skin: no rash or lesions.  GU: no dysuria, change in color of urine, no urgency or frequency.  No flank pain, no hematuria   MS:  No joint pain or swelling.  No decreased range of motion.  No back pain.  Psych:  No change in mood or affect. No depression or anxiety.  No memory loss.         Objective:   Physical Exam Vitals:   09/19/16 0905  BP: 120/76  Pulse: 68  Temp: 97.9 F (36.6 C)  TempSrc: Oral  SpO2: 94%  Weight: 204 lb 3.2 oz (92.6 kg)  Height: 5\' 11"  (1.803 m)    GEN: A/Ox3; pleasant , NAD, well nourished    HEENT:  [place]/AT,  EACs-clear, TMs-wnl, NOSE-clear, THROAT-clear, no lesions, no postnasal drip or exudate noted.   NECK:  Supple w/ fair ROM; no JVD; normal carotid impulses w/o bruits; no thyromegaly or nodules palpated; no lymphadenopathy.  No stridor   RESP  exp wheezing  no accessory muscle use, no dullness to percussion Speaks in full sentences   CARD:  RRR, no m/r/g  , no peripheral edema, pulses intact, no cyanosis or clubbing.  GI:   Soft & nt; nml bowel sounds; no organomegaly or masses detected.   Musco: Warm bil, no deformities or joint swelling noted.   Neuro: alert, no focal deficits noted.    Skin: Warm, no lesions or rashes  Tammy Parrett NP-C  [facility]  09/19/2016

## 2016-09-19 NOTE — Assessment & Plan Note (Signed)
Recurrent exacerbation requiring frequent steroid use.  Steroid pt educaiton given  Awaiting Nucala approval   Plan  Patient Instructions  Begin Medrol Dose Pack .  Continue on Spiriva and BREO .  Continue on Xyzal and Nasonex.  Delsym 2 tsp Twice daily , As needed  Cough .  Begin Nucala when approved Follow up Dr. Chase Caller in Dec as planned and As needed    Please contact office for sooner follow up if symptoms do not improve or worsen or seek emergency care

## 2016-09-19 NOTE — Addendum Note (Signed)
Addended by: Doroteo Glassman D on: 09/19/2016 09:44 AM   Modules accepted: Orders

## 2016-09-19 NOTE — Patient Instructions (Addendum)
Begin Medrol Dose Pack .  Continue on Spiriva and BREO .  Continue on Xyzal and Nasonex.  Delsym 2 tsp Twice daily , As needed  Cough .  Begin Nucala when approved Follow up Dr. Chase Caller in Dec as planned and As needed    Please contact office for sooner follow up if symptoms do not improve or worsen or seek emergency care

## 2016-09-20 NOTE — Telephone Encounter (Signed)
Forwarding back to [place] to continue following up on.

## 2016-09-20 NOTE — Telephone Encounter (Signed)
Patient called back for Nucala phone number.  Per the notes from today's message I gave him the number [phone] for [facility].  He stated he would contact them.

## 2016-09-20 NOTE — Telephone Encounter (Signed)
Called and spoke to [employer] rep and was informed they were unable to verify pt's benefits because both of the pt's insurance are listed as secondary. Pt will need to call insurance to update the information then call [facility] to inform them. Questioned if the pt is contacted when there are issues needing to be addressed, the rep confirmed this but states she is unsure why the pt has not yet been contacted.   Called and spoke to pt. Informed him of the above. Pt verbalized understanding and states he will contact his insurance and then will call back to get the phone number for [facility] to inform them of the update. [facility] ph: [phone]. Will await pt's call back.

## 2016-09-25 NOTE — Telephone Encounter (Signed)
lmtcb for pt to see if pt contacted [facility] to update insurance info.

## 2016-09-25 NOTE — Telephone Encounter (Signed)
Pt returned call said that he did talk w/ people @ [facility] and gave the his new ins. Info.Hillery Hunter

## 2016-09-28 NOTE — Telephone Encounter (Signed)
Will route back to elise to follow up on

## 2016-10-03 NOTE — Telephone Encounter (Signed)
Received fax on pt. PA is needed for pt. Called and initiated PA on 11.14.17, called [employer] at [phone] and spoke to Max. PA is under review for 2-15 days. Reference number [id number]. Will await PA decision.

## 2016-10-04 ENCOUNTER — Telehealth: Payer: Self-pay | Admitting: Internal Medicine

## 2016-10-04 MED ORDER — PREDNISONE 10 MG PO TABS: ORAL_TABLET | ORAL | 0 refills | Status: DC

## 2016-10-04 NOTE — Telephone Encounter (Signed)
Please see phone note from 10/04/16 regarding pt's s/s.   Regarding pt's Nucala PA, pt aware this is in process and could take up to 2 weeks and we will call with updates.

## 2016-10-04 NOTE — Telephone Encounter (Signed)
Send prescription for Prednisone starting at 40 mg. Reduce dose by 10 mg every 3 days.

## 2016-10-04 NOTE — Telephone Encounter (Signed)
Pt called in regarding his s/s. Pt c/o increase in nonprod cough that leads to a headache and increase in wheezing x 7 days. Pt denies change in SOB, CP/tightness, and f/c/s. Pt is requesting a pred taper. Will send to [facility] in MR's absence.   Dr. Vaughan Browner please advise. Thanks.

## 2016-10-04 NOTE — Telephone Encounter (Signed)
Rx sent to preferred pharmacy. Pt aware & voiced understanding. Nothing further needed.  

## 2016-10-04 NOTE — Telephone Encounter (Signed)
Patient checking status of Nucala - in addition he states his symptoms have returned and would like an rx for steroids to be sent to his pharmacy [facility] on [address]. He can be reached at [phone] -pr

## 2016-10-19 NOTE — Telephone Encounter (Signed)
Called [employer] at [phone] and spoke to [place]. Pt's Nucala has been approved from [id number] for 100mg  q 4 weeks for one year. Gae Bon states pt is using Psychologist, counselling. Called [facility] at [phone] and spoke to pharmacist and gave VO for Nucala and will ship out ASAP. Will keep message open until the shipment has been received. Called and made pt aware.

## 2016-10-24 ENCOUNTER — Encounter: Payer: Self-pay | Admitting: Internal Medicine

## 2016-10-24 ENCOUNTER — Telehealth: Payer: Self-pay | Admitting: Internal Medicine

## 2016-10-24 ENCOUNTER — Ambulatory Visit (INDEPENDENT_AMBULATORY_CARE_PROVIDER_SITE_OTHER): Payer: 59 | Admitting: Internal Medicine

## 2016-10-24 VITALS — BP 118/72 | HR 80 | Ht 71.0 in | Wt 207.0 lb

## 2016-10-24 DIAGNOSIS — R768 Other specified abnormal immunological findings in serum: Secondary | ICD-10-CM | POA: Diagnosis not present

## 2016-10-24 DIAGNOSIS — J45901 Unspecified asthma with (acute) exacerbation: Secondary | ICD-10-CM | POA: Diagnosis not present

## 2016-10-24 DIAGNOSIS — D721 Eosinophilia, unspecified: Secondary | ICD-10-CM

## 2016-10-24 DIAGNOSIS — J4540 Moderate persistent asthma, uncomplicated: Secondary | ICD-10-CM

## 2016-10-24 MED ORDER — METHYLPREDNISOLONE 4 MG PO TBPK: ORAL_TABLET | ORAL | 0 refills | Status: DC

## 2016-10-24 NOTE — Progress Notes (Signed)
Subjective:     Patient ID: Seth Kerr, male   DOB: 1971-11-20, 45 y.o.   MRN: [id number]  HPI    IOV 06/12/2016  Chief Complaint  Patient presents with  . PULMONARY CONSULT    Referred by Dr. Mosetta Anis. Pt reports increasing breathing issues since moving to [place] 6 years ago. Pt had recent pna in April. Pt c/o continued SOB and chest tightness that is worse with exertion and some cough at night. Pt denies cough. Pt is on Breo and albuterol HFA. Pt is also on oral prednisone taper currently. Pt has significant second hand smoke exposure. Pt is on allergy injections weekly.     45 year old male works at [employer]. He is been referred by Dr. Donneta Romberg for evaluation of shortness of breath in the setting of confirmed diagnosis of asthma and allergies  History is gained from talking to the patient and also review of the referral notes. He tells me that he worked in the [employer] area. When he lived there he had no prior diagnosis of asthma respirator complaints. Moved to [facility] approximately 6 years ago and then within several months started noticing insidious onset of shortness of breath and chest tightness. For the last 4-5 years he has been seeing Dr. Donneta Romberg with a confirmed diagnosis of asthma. He was then started on inhaled steroids and Singulair. With this he also had allergy testing which was positive. He says once his treatments commenced he was able to return to normal activities of daily living including running long distances. He felt well. Approximately one year ago he stopped his allergy shots. Then in April 2017 he says he had a pneumonia for which she was treated with antibiotics. There is a chest x-ray report from 03/08/2016 that reports a nodular focus in the left lower lobe which could her percent focus of pneumonia but could not exclude a pulmonary nodule. I personally visualized this film and agree. He had follow-up chest x-ray 05/05/2016 that showed  improvement. He says that he was beginning to feel better but then started having nocturnal symptoms again with shortness of breath. Shortness of breath is exertional. His been going on for the last few weeks. He is now unable to climb stairs without feeling shortness of breath. Currently is in the middle of a prednisone taper  In terms of his allergies: He had repeat allergy testing April 2017 and it is positive for different environmental allergens. He has now been restarted on allergy shots.  He has had spirometry with Dr. Donneta Romberg. In 07/21/2015 FEV1 1.44 L/35% with a ratio 68 showing severe obstruction. Follow-up 03/08/2016 on the time of pneumonia FEV1 2.2 L/53% with a ratio of 79 with a 13% bronchodilator response. Again showing obstruction of moderate degree. He had a repeat spirometry 03/17/2016 which is only prebronchodilator that showed further improvement FEV1 2.68 L/64% ratio 79. And then again 06/05/2016 he had no further improvement with a post bronchodilator response FEV1 of 2.5 L/61 ratio 80 which is a 15% bronchi were to response. Exhaled nitric oxide on the same day was elevated at 59 ppb. Due to obstructive nature of the spirometry is been referred here.  Walking desaturation test on an 85 feet 3 laps on room at: Did not desaturate below 97%. NOTED BRADYCARDIA - HR 60s entire walk- walked fast  Exhaled nitric oxide today in our office: 26 ppb and normal (was 59 on 06/05/16 @ Dr Donneta Romberg in office )   Noted: he  is in middle of a 12d prednisone taper - 5 days left    has a past medical history of Allergic rhinitis, cause unspecified; Asthma; Erectile dysfunction; Irritable bowel syndrome (IBS); and Onychomycosis.   reports that he has never smoked. He has never used smokeless tobacco.   PFT 06/15/16  showed an FEV1 at 93%, ratio 83, FVC 88%, no significant bronchodilator response. Positive mid flow reversibility. DLCO 129%     07/27/2016 Acute OV  : Asthma  Patient returns for an  acute office visit. Complains for last 1.5 weeks that he has dry cough , wheezing, tightness , and dyspnea. Increased SABA use.  Patient was seen for pulmonary consult 06/15/2016 for asthma evaluation. Patient was having active asthma flare and was on prednisone taper. Marland Kitchen He was set up for a pulmonary function test on 06/15/2016 that showed significant improvement in lung function with an FEV1 at 93%, ratio 83% and no significant bronchodilator response. Previous spirometry had showed significant airflow obstructions with FEV1 ranging from 35-53%. It was felt that his lung function improved because of prednisone c/w asthma . He was being followed by Dr. Donneta Romberg , Allergist. He was previously on allergy vaccines but was stopped due to persistent asthma flares. Says he had no breathing problems until he moved here from [facility] 6 yrs ago. Previously ran marathons .  Exhaled nitric oxide test was 26. Previously had been 40. Today FENO is 87  - 07/27/16 Chart review shows very high eosinophils . CXR in April 2017 showed LLL nodular focus ? Pna .  Follow up cxr showed improvement in June.  Spiriva was added to his BREO last ov.    08/16/2016 Acute OV p  : Asthma  Patient returns for an acute office visit for persistent wheezing and cough .  Seen  2 weeks ago for asthma flare. Started on Steroid taper .  Felt some better but never stopped wheezing.  Labs showed IgE at, 254 + rast-tree/grass.  , High eosinophils on Diff , FeNO was elevated at 87  CXR showed mild opacity at left lung base.  CT chest done polylobar areas of scarring likely from previous pna, mild nodularity along lingula ? Scarring. No ILD changes.  He remains on BREO , Spiriva, Singulair , Xyzal and Nasonex .  Cough is keeping him up at night . Denies fever, chest pain, orthopnea , edema or hemoptysis, or discolored mucus.   Patient was seen for pulmonary consult 06/15/2016 for asthma evaluation. Patient was having active asthma flare and  was on prednisone taper. Marland Kitchen He was set up for a pulmonary function test on 06/15/2016 that showed significant improvement in lung function with an FEV1 at 93%, ratio 83% and no significant bronchodilator response. Previous spirometry had showed significant airflow obstructions with FEV1 ranging from 35-53%. It was felt that his lung function improved because of prednisone c/w asthma . He was being followed by Dr. Donneta Romberg , Allergist. He was previously on allergy vaccines but was stopped due to persistent asthma flares. Says he had no breathing problems until he moved here from [facility] 6 yrs ago. Previously ran marathons .     OV 08/23/2016  Chief Complaint  Patient presents with  . 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 Since then he had a CT chest and allergy evaluation. What is significant is that he is significantly high used to this. CT chest is clean. He's not had CT sinus but he does not have sinus problems. His exhaled nitric oxide has been elevated during exacerbations. He saw nurse practitioner in the interim early September 2017 was given prednisone. When he came back again in late September 2017 which was one week ago with another exacerbation and again given prednisone. Visit lasted prednisone and is here for follow-up. He feels that he is largely better. He is worried that he needs prednisone repeatedly. He is not sure that he can manage when he comes off the prednisone but he is not a try. His daughter has similar problems after moving from [place] to [place] and she is on Xolair and has good control. Therefore he is questions on this. He says he is compliant with his inhalers.  Asthma control question test: This is a 4 week questionnaire but I asked him how he felt in  the last 2 days. He feels most of the time his asthma prevents him from getting his work done.Marland Kitchen He feels dyspneic more than once a day. He wakes up frequently at night. He is using albuterol rescue one or 2 times daily. He feels his asthma is poorly controlled despite d6 of prednisone and chest very tight   Exhaled nitric oxide today: While on prednisone 6th day is elevated at 80pbo    09/19/2016 Acute OV p  : Asthma  Patient returns for an acute office visit for persistent wheezing and cough .  Seen  3 weeks ago for asthma flare. Started on medrol dose pack and zpack . Felt some better for couple of weeks then symptoms returned with dry cough and wheezing  Increased SABA use for last 2-3 days.  FeNO today is 56 . (no steroids for 2 weeks) .    45 yo male Never smoker seen for pulmonary consult 06/12/2016 for shortness of breath and asthma evaluation.   PFT 06/15/16  showed an FEV1 at 93%, ratio 83, FVC 88%, no significant bronchodilator response. Positive mid flow reversibility. DLCO 129% 07/27/16  IgE at, 254 + rast-tree/grass.  , High eosinophils on Diff , FeNO was elevated at 87  92017 CT chest done polylobar areas of scarring likely from previous pna, mild nodularity along lingula ? Scarring. No ILD changes.     He remains on BREO , Spiriva, Singulair , Xyzal and Nasonex .  Nucala paperwork has been started. Awaiting approval . We checked on process, waiting on PA .  Denies fever, chest pain, orthopnea , edema or hemoptysis, or discolored mucus.   Patient was seen for pulmonary consult 06/15/2016 for asthma evaluation. Patient was having active asthma flare and was on prednisone taper. Marland Kitchen He was set up for a pulmonary function test on 06/15/2016 that showed significant improvement in lung function with an FEV1 at 93%, ratio 83% and no significant bronchodilator response. Previous spirometry had showed significant airflow obstructions with FEV1 ranging from 35-53%. It was felt that his  lung function improved because of prednisone c/w asthma . He was being followed by Dr. Donneta Romberg , Allergist. He was previously on allergy vaccines but was stopped due to persistent asthma flares. Says he had no breathing problems until he moved here from [facility] 6 yrs ago. Previously ran marathons .   OV 10/24/2016  Chief Complaint  Patient presents with  . Follow-up  Pt saw TP on 10.31.17 for an acute visit. Pt c/o nonprod cough with intermittent chest congestion, increase in SOB. Pt deneis CP/tightness and f/c/s. Pt states he feels he worsens when he comes off the prednisone.      Follow-up for eosinophilic asthma  He still waiting for his interleukin-5 receptor antibody treatment to start. It is currently been approved for the shipment is pending. He tells me that he is unable to come off prednisone completely. Currently he is off prednisone for 2 weeks and he is insidious exacerbation with increased nocturnal symptoms, daily albuterol use multiple times and wheezing and chest tightness. He feels he needs another prednisone. He does not want to do daily prednisone. Preference is to start Nucala and see if he can avoid prednisone. He is frustrated    has a past medical history of Allergic rhinitis, cause unspecified; Asthma; Erectile dysfunction; Irritable bowel syndrome (IBS); and Onychomycosis.   reports that he has never smoked. He has never used smokeless tobacco.  Past Surgical History:  Procedure Laterality Date  . APPENDECTOMY  age 59  . SEPTOPLASTY  approx age 14  . SHOULDER SURGERY Right     Allergies  Allergen Reactions  . Aspirin Other (See Comments)    Not actual allergy, had chronic nosebleeds as a child and was instructed not to use aspirin.    Immunization History  Administered Date(s) Administered  . Influenza Split 09/12/2012, 08/29/2014, 09/05/2016  . Influenza,inj,Quad PF,36+ Mos 10/09/2013, 09/03/2015  . Pneumococcal Polysaccharide-23 05/05/2015  . Tdap  04/22/2014    Family History  Problem Relation Age of Onset  . Cancer Mother     lung cancer, smoker  . Alcohol abuse Mother   . Cancer Father     stomach cancer, smoker  . Alcohol abuse Father   . Asthma Daughter   . Diabetes Neg Hx   . Heart disease Neg Hx      Current Outpatient Prescriptions:  .  albuterol (PROVENTIL) (2.5 MG/3ML) 0.083% nebulizer solution, Take 3 mLs (2.5 mg total) by nebulization every 6 (six) hours as needed for wheezing or shortness of breath., Disp: 75 mL, Rfl: 3 .  EPIPEN 2-PAK 0.3 MG/0.3ML SOAJ injection, as directed., Disp: , Rfl: 1 .  fluticasone furoate-vilanterol (BREO ELLIPTA) 200-25 MCG/INH AEPB, Inhale 1 puff into the lungs daily., Disp: , Rfl:  .  HYDROcodone-homatropine (HYDROMET) 5-1.5 MG/5ML syrup, Take 5 mLs by mouth every 6 (six) hours as needed., Disp: 240 mL, Rfl: 0 .  levocetirizine (XYZAL) 5 MG tablet, Take 5 mg by mouth every evening., Disp: , Rfl:  .  mometasone (ELOCON) 0.1 % ointment, Apply 1 application topically daily. Reported on 05/17/2016, Disp: , Rfl:  .  montelukast (SINGULAIR) 10 MG tablet, Take 10 mg by mouth. Reported on 03/15/2016, Disp: , Rfl:  .  PROAIR RESPICLICK 123XX123 (90 Base) MCG/ACT AEPB, Inhale 2 puffs into the lungs every 4 (four) hours as needed., Disp: 1 each, Rfl: 3 .  sildenafil (VIAGRA) 100 MG tablet, Take 0.5-1 tablets (50-100 mg total) by mouth daily as needed for erectile dysfunction., Disp: 5 tablet, Rfl: 11 .  SPIRIVA RESPIMAT 2.5 MCG/ACT AERS, INHALE 2 PUFFS INTO THE LUNGS DAILY., Disp: 1 Inhaler, Rfl: 5 .  mometasone (NASONEX) 50 MCG/ACT nasal spray, Place 2 sprays into the nose 2 (two) times daily. Reported on 05/17/2016 (Patient not taking: Reported on 10/24/2016), Disp: 17 g, Rfl: 4   Review of Systems     Objective:   Physical Exam  Constitutional: He is oriented to person, place, and time. He appears well-developed and well-nourished. No distress.  HENT:  Head: Normocephalic and atraumatic.  Right  Ear: External ear normal.  Left Ear: External ear normal.  Mouth/Throat: Oropharynx is clear and moist. No oropharyngeal exudate.  Eyes: Conjunctivae and EOM are normal. Pupils are equal, round, and reactive to light. Right eye exhibits no discharge. Left eye exhibits no discharge. No scleral icterus.  Neck: Normal range of motion. Neck supple. No JVD present. No tracheal deviation present. No thyromegaly present.  Cardiovascular: Normal rate, regular rhythm and intact distal pulses.  Exam reveals no gallop and no friction rub.   No murmur heard. Pulmonary/Chest: Effort normal and breath sounds normal. No respiratory distress. He has no wheezes. He has no rales. He exhibits no tenderness.  Faint wheeze  Abdominal: Soft. Bowel sounds are normal. He exhibits no distension and no mass. There is no tenderness. There is no rebound and no guarding.  Musculoskeletal: Normal range of motion. He exhibits no edema or tenderness.  Lymphadenopathy:    He has no cervical adenopathy.  Neurological: He is alert and oriented to person, place, and time. He has normal reflexes. No cranial nerve deficit. Coordination normal.  Skin: Skin is warm and dry. No rash noted. He is not diaphoretic. No erythema. No pallor.  Psychiatric: He has a normal mood and affect. His behavior is normal. Judgment and thought content normal.  Nursing note and vitals reviewed.  Vitals:   10/24/16 0904  BP: 118/72  Pulse: 80  SpO2: 97%  Weight: 207 lb (93.9 kg)  Height: 5\' 11"  (1.803 m)   Estimated body mass index is 28.87 kg/m as calculated from the following:   Height as of this encounter: 5\' 11"  (1.803 m).   Weight as of this encounter: 207 lb (93.9 kg).      Assessment:       ICD-9-CM ICD-10-CM   1. Very poorly controlled moderate persistent asthma 493.90 J45.40   2. Asthma with acute exacerbation, unspecified asthma severity, unspecified whether persistent 493.92 J45.901   3. 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 Elevated  IgE level 795.79 R76.8        Plan:       Begin and finish Medrol Dose Pack .  Continue on Spiriva and BREO .  Continue on Xyzal and Nasonex.  Delsym 2 tsp Twice daily , As needed  Cough .  Begin Nucala  ASAP  - we are waiting on shipment  Follow up -  Dr. Chase Caller in 20months and As needed    - Please contact office for sooner follow up if symptoms do not improve or worsen or seek emergency care     Dr. Brand Males, M.D., [facility].C.P Pulmonary and Critical Care Medicine Staff Physician [facility] Pager: [phone], If no answer or between  15:00h - 7:00h: call [phone]  10/24/2016 9:20 AM

## 2016-10-24 NOTE — Telephone Encounter (Signed)
Called [name] and spoke with Freda Munro Their system shows that a PA is required on [place] Pt's insurance [facility]) should be faxing over PA form/information No sign of PA info in [employer] Will forward to [place] for follow up

## 2016-10-24 NOTE — Patient Instructions (Signed)
ICD-9-CM ICD-10-CM   1. Very poorly controlled moderate persistent asthma 493.90 J45.40   2. Asthma with acute exacerbation, unspecified asthma severity, unspecified whether persistent 493.92 J45.901   3. 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 Elevated IgE level 795.79 R76.8      Begin and finish Medrol Dose Pack .  Continue on Spiriva and BREO .  Continue on Xyzal and Nasonex.  Delsym 2 tsp Twice daily , As needed  Cough .  Begin Nucala  ASAP  - we are waiting on shipment  Follow up -  Dr. Chase Caller in 1months and As needed    - Please contact office for sooner follow up if symptoms do not improve or worsen or seek emergency care

## 2016-10-25 NOTE — Telephone Encounter (Signed)
[name] called and questioned if PA is needed. Called and spoke to [place] rep and was advised a PA was already completed and all that is needed is the pharmacy's tax ID is needing to be added to the PA in order for the medication to be shipped. She states this will be done in 24-48 hours and then the pt should be contact to give approval for shipment. Pt already aware of this.

## 2016-10-25 NOTE — Telephone Encounter (Signed)
Called and spoke to [facility] rep and was advised a PA was already completed (see phone note from 10.30.17) and all that is needed is the pharmacy's tax ID is needing to be added to the PA in order for the medication to be shipped. She states this will be done in 24-48 hours and then the pt should be contact to give approval for shipment. Pt already aware of this. Will continue to document in phone note from 10.30.17. Will sign off.

## 2016-10-31 ENCOUNTER — Telehealth: Payer: Self-pay | Admitting: Internal Medicine

## 2016-10-31 MED ORDER — PREDNISONE 10 MG PO TABS: ORAL_TABLET | ORAL | 0 refills | Status: DC

## 2016-10-31 NOTE — Telephone Encounter (Signed)
Called and spoke to pt. Pt states he has not heard from [facility] about his Nucala. Called [name] and spoke to [place]. Jackelyn Poling states the medication is ready to be shipped but they need the pt's consent, pt needs to call [phone]. Called and spoke to pt. Informed him of the number to call about giving the OK to ship Nucala. Pt verbalized understanding. Pt also states he is improving since being seen but is still having a nonprod cough with occassional chest congestion. Pt states his SOB is back to baseline. Pt is questioning if he needs another round of steroids. Pt would like a response today.   MR please advise on pt's cough. Thanks.

## 2016-10-31 NOTE — Telephone Encounter (Signed)
Will send to elise to help with the Nucala portion in the AM as our office is without power this afternoon.   Prednisone sent to pharmacy.

## 2016-10-31 NOTE — Telephone Encounter (Signed)
When will he get his [place]? There is a new sample procedure that Seth Kerr is aware of . If more than week delay then get sample nucala  Patient can do Please take prednisone 40 mg x1 day, then 30 mg x1 day, then 20 mg x1 day, then 10 mg x1 day, and then 5 mg x1 day and stop

## 2016-11-01 ENCOUNTER — Telehealth: Payer: Self-pay | Admitting: Internal Medicine

## 2016-11-01 NOTE — Telephone Encounter (Signed)
Vials: 1 Order Date: 11/01/16  Ship Date: 11/03/2016  Pt's initial nucala injection needs to be scheduled- need to ensue that pt is aware of 2hr wait time and brings epipen to appt.    lmtcb X1 for pt to schedule nucala inj.

## 2016-11-01 NOTE — Telephone Encounter (Signed)
Will keep message open until medication has been received.

## 2016-11-01 NOTE — Telephone Encounter (Signed)
Seth Kerr, please advise on Nucala. Thanks.

## 2016-11-01 NOTE — Telephone Encounter (Signed)
Seth Kerr can we close this message?  thanks

## 2016-11-01 NOTE — Telephone Encounter (Signed)
Please see phone note from 10.30.17. Pt has been approved for Nucala and we are waiting on shipment. The pt had to give verbal consent prior to shipment. Will sign off on message.

## 2016-11-02 MED ORDER — EPIPEN 2-PAK 0.3 MG/0.3ML IJ SOAJ: INTRAMUSCULAR | 1 refills | Status: DC

## 2016-11-02 NOTE — Telephone Encounter (Signed)
Called and spoke with pt and he is aware of appt for the initial nucala injection on 12/20 at 9"30 and the pt is aware that he will have a 2 hour wait and that he will need to bring in the epi pen to this appt with him.  Pt stated that he did not have an epi pen so I have sent this to his pharmacy.

## 2016-11-02 NOTE — Telephone Encounter (Signed)
Patient returned phone call [phone].Seth Kerr

## 2016-11-03 ENCOUNTER — Telehealth: Payer: Self-pay | Admitting: Internal Medicine

## 2016-11-03 NOTE — Telephone Encounter (Signed)
Will close message but hold in my box to follow up on.

## 2016-11-06 ENCOUNTER — Telehealth: Payer: Self-pay | Admitting: Internal Medicine

## 2016-11-06 NOTE — Telephone Encounter (Signed)
Arrival Date: 11/03/16 # of Vials: 1 Lot #: [id number]  Expiration Date: 3/19 I have no info., no paperwork on this pt.. I didn't have a chance to check and see if he was our only [place] pt. Until today. He is so I'm putting his nucala in epic.

## 2016-11-06 NOTE — Telephone Encounter (Signed)
Medication has been received. Pt is scheduled for first injection on 12/20. Pt aware of 2 hour wait and to bring epi pen. Nothing further needed at this time.

## 2016-11-06 NOTE — Telephone Encounter (Signed)
Created in error

## 2016-11-08 ENCOUNTER — Ambulatory Visit (INDEPENDENT_AMBULATORY_CARE_PROVIDER_SITE_OTHER): Payer: 59

## 2016-11-08 DIAGNOSIS — J4541 Moderate persistent asthma with (acute) exacerbation: Secondary | ICD-10-CM

## 2016-11-10 DIAGNOSIS — J4541 Moderate persistent asthma with (acute) exacerbation: Secondary | ICD-10-CM | POA: Diagnosis not present

## 2016-11-10 MED ORDER — MEPOLIZUMAB 100 MG ~~LOC~~ SOLR
100.0000 mg | SUBCUTANEOUS | Status: DC
  Administered 2016-11-10: 100 mg via SUBCUTANEOUS

## 2016-11-24 ENCOUNTER — Telehealth: Payer: Self-pay | Admitting: Internal Medicine

## 2016-11-24 NOTE — Telephone Encounter (Addendum)
Vials: 1 Order Date: 11/24/16 Ship Date: 12/05/16

## 2016-11-27 ENCOUNTER — Telehealth: Payer: Self-pay | Admitting: Internal Medicine

## 2016-11-27 MED ORDER — PREDNISONE 10 MG PO TABS: ORAL_TABLET | ORAL | 0 refills | Status: DC

## 2016-11-27 NOTE — Telephone Encounter (Signed)
Spoke with pt. States that he is not feeling well. Reports wheezing and SOB. Denies chest tightness, coughing or fever. Symptoms started 1 week ago. Has been taking nebulizer treatments frequently. Would like a prednisone taper.  MR - please advise. Thanks.

## 2016-11-27 NOTE — Telephone Encounter (Signed)
Spoke with pt. He started [place] on 11/08/16. He has not been exposed to the flu. Pt never stated that he was worse, he just isn't feeling well. Rx has been sent in. Nothing further was needed.

## 2016-11-27 NOTE — Telephone Encounter (Signed)
When did he start his nucala?  Did he get exposed to flu?  Why does he think he is worse? Cold weather?  Ok for Take prednisone 40 mg daily x 2 days, then 20mg  daily x 2 days, then 10mg  daily x 2 days, then 5mg  daily x 2 days and stop   Dr. Brand Males, M.D., [facility].C.P Pulmonary and Critical Care Medicine Staff Physician [facility] Pager: [phone], If no answer or between  15:00h - 7:00h: call [phone]  11/27/2016 2:04 PM

## 2016-11-30 DIAGNOSIS — J4540 Moderate persistent asthma, uncomplicated: Secondary | ICD-10-CM | POA: Diagnosis not present

## 2016-12-08 NOTE — Telephone Encounter (Signed)
Arrival Date: 12/08/16  # of Vials: 1 Lot #: [id number] Expiration Date: 01/2018

## 2016-12-11 ENCOUNTER — Ambulatory Visit (INDEPENDENT_AMBULATORY_CARE_PROVIDER_SITE_OTHER): Payer: 59

## 2016-12-11 DIAGNOSIS — D721 Eosinophilia, unspecified: Secondary | ICD-10-CM

## 2016-12-11 MED ORDER — MEPOLIZUMAB 100 MG ~~LOC~~ SOLR
100.0000 mg | Freq: Once | SUBCUTANEOUS | Status: AC
  Administered 2016-12-11: 100 mg via SUBCUTANEOUS

## 2016-12-25 ENCOUNTER — Ambulatory Visit (INDEPENDENT_AMBULATORY_CARE_PROVIDER_SITE_OTHER): Payer: 59 | Admitting: Internal Medicine

## 2016-12-25 ENCOUNTER — Encounter: Payer: Self-pay | Admitting: Internal Medicine

## 2016-12-25 VITALS — BP 112/78 | HR 69 | Ht 71.0 in

## 2016-12-25 DIAGNOSIS — Z9109 Other allergy status, other than to drugs and biological substances: Secondary | ICD-10-CM

## 2016-12-25 DIAGNOSIS — J4541 Moderate persistent asthma with (acute) exacerbation: Secondary | ICD-10-CM | POA: Diagnosis not present

## 2016-12-25 DIAGNOSIS — R911 Solitary pulmonary nodule: Secondary | ICD-10-CM

## 2016-12-25 DIAGNOSIS — R768 Other specified abnormal immunological findings in serum: Secondary | ICD-10-CM | POA: Diagnosis not present

## 2016-12-25 DIAGNOSIS — D721 Eosinophilia, unspecified: Secondary | ICD-10-CM

## 2016-12-25 LAB — NITRIC OXIDE: Nitric Oxide: 84

## 2016-12-25 MED ORDER — PREDNISONE 10 MG PO TABS: ORAL_TABLET | ORAL | 0 refills | Status: DC

## 2016-12-25 NOTE — Progress Notes (Addendum)
Subjective:     Patient ID: Seth Kerr, male   DOB: 1970-11-29, 46 y.o.   MRN: [id number]  HPI   IOV 06/12/2016  Chief Complaint  Patient presents with  . PULMONARY CONSULT    Referred by Dr. Mosetta Anis. Pt reports increasing breathing issues since moving to [place] 6 years ago. Pt had recent pna in April. Pt c/o continued SOB and chest tightness that is worse with exertion and some cough at night. Pt denies cough. Pt is on Breo and albuterol HFA. Pt is also on oral prednisone taper currently. Pt has significant second hand smoke exposure. Pt is on allergy injections weekly.     46 year old male works at [employer]. He is been referred by Dr. Donneta Romberg for evaluation of shortness of breath in the setting of confirmed diagnosis of asthma and allergies  History is gained from talking to the patient and also review of the referral notes. He tells me that he worked in the [employer] area. When he lived there he had no prior diagnosis of asthma respirator complaints. Moved to [facility] approximately 6 years ago and then within several months started noticing insidious onset of shortness of breath and chest tightness. For the last 4-5 years he has been seeing Dr. Donneta Romberg with a confirmed diagnosis of asthma. He was then started on inhaled steroids and Singulair. With this he also had allergy testing which was positive. He says once his treatments commenced he was able to return to normal activities of daily living including running long distances. He felt well. Approximately one year ago he stopped his allergy shots. Then in April 2017 he says he had a pneumonia for which she was treated with antibiotics. There is a chest x-ray report from 03/08/2016 that reports a nodular focus in the left lower lobe which could her percent focus of pneumonia but could not exclude a pulmonary nodule. I personally visualized this film and agree. He had follow-up chest x-ray 05/05/2016 that showed  improvement. He says that he was beginning to feel better but then started having nocturnal symptoms again with shortness of breath. Shortness of breath is exertional. His been going on for the last few weeks. He is now unable to climb stairs without feeling shortness of breath. Currently is in the middle of a prednisone taper  In terms of his allergies: He had repeat allergy testing April 2017 and it is positive for different environmental allergens. He has now been restarted on allergy shots.  He has had spirometry with Dr. Donneta Romberg. In 07/21/2015 FEV1 1.44 L/35% with a ratio 68 showing severe obstruction. Follow-up 03/08/2016 on the time of pneumonia FEV1 2.2 L/53% with a ratio of 79 with a 13% bronchodilator response. Again showing obstruction of moderate degree. He had a repeat spirometry 03/17/2016 which is only prebronchodilator that showed further improvement FEV1 2.68 L/64% ratio 79. And then again 06/05/2016 he had no further improvement with a post bronchodilator response FEV1 of 2.5 L/61 ratio 80 which is a 15% bronchi were to response. Exhaled nitric oxide on the same day was elevated at 59 ppb. Due to obstructive nature of the spirometry is been referred here.  Walking desaturation test on an 85 feet 3 laps on room at: Did not desaturate below 97%. NOTED BRADYCARDIA - HR 60s entire walk- walked fast  Exhaled nitric oxide today in our office: 26 ppb and normal (was 59 on 06/05/16 @ Dr Donneta Romberg in office )   Noted: he is  in middle of a 12d prednisone taper - 5 days left    has a past medical history of Allergic rhinitis, cause unspecified; Asthma; Erectile dysfunction; Irritable bowel syndrome (IBS); and Onychomycosis.   reports that he has never smoked. He has never used smokeless tobacco.   PFT 06/15/16  showed an FEV1 at 93%, ratio 83, FVC 88%, no significant bronchodilator response. Positive mid flow reversibility. DLCO 129%     07/27/2016 Acute OV  : Asthma  Patient returns for an  acute office visit. Complains for last 1.5 weeks that he has dry cough , wheezing, tightness , and dyspnea. Increased SABA use.  Patient was seen for pulmonary consult 06/15/2016 for asthma evaluation. Patient was having active asthma flare and was on prednisone taper. Marland Kitchen He was set up for a pulmonary function test on 06/15/2016 that showed significant improvement in lung function with an FEV1 at 93%, ratio 83% and no significant bronchodilator response. Previous spirometry had showed significant airflow obstructions with FEV1 ranging from 35-53%. It was felt that his lung function improved because of prednisone c/w asthma . He was being followed by Dr. Donneta Romberg , Allergist. He was previously on allergy vaccines but was stopped due to persistent asthma flares. Says he had no breathing problems until he moved here from [facility] 6 yrs ago. Previously ran marathons .  Exhaled nitric oxide test was 26. Previously had been 42. Today FENO is 87  - 07/27/16 Chart review shows very high eosinophils . CXR in April 2017 showed LLL nodular focus ? Pna .  Follow up cxr showed improvement in June.  Spiriva was added to his BREO last ov.    08/16/2016 Acute OV p  : Asthma  Patient returns for an acute office visit for persistent wheezing and cough .  Seen  2 weeks ago for asthma flare. Started on Steroid taper .  Felt some better but never stopped wheezing.  Labs showed IgE at, 254 + rast-tree/grass.  , High eosinophils on Diff , FeNO was elevated at 87  CXR showed mild opacity at left lung base.  CT chest done polylobar areas of scarring likely from previous pna, mild nodularity along lingula ? Scarring. No ILD changes.  He remains on BREO , Spiriva, Singulair , Xyzal and Nasonex .  Cough is keeping him up at night . Denies fever, chest pain, orthopnea , edema or hemoptysis, or discolored mucus.   Patient was seen for pulmonary consult 06/15/2016 for asthma evaluation. Patient was having active asthma flare and  was on prednisone taper. Marland Kitchen He was set up for a pulmonary function test on 06/15/2016 that showed significant improvement in lung function with an FEV1 at 93%, ratio 83% and no significant bronchodilator response. Previous spirometry had showed significant airflow obstructions with FEV1 ranging from 35-53%. It was felt that his lung function improved because of prednisone c/w asthma . He was being followed by Dr. Donneta Romberg , Allergist. He was previously on allergy vaccines but was stopped due to persistent asthma flares. Says he had no breathing problems until he moved here from [facility] 6 yrs ago. Previously ran marathons .     OV 08/23/2016  Chief Complaint  Patient presents with  . 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  Since then he had a CT chest and allergy evaluation. What is significant is that he is significantly high used to this. CT chest is clean. He's not had CT sinus but he does not have sinus problems. His exhaled nitric oxide has been elevated during exacerbations. He saw nurse practitioner in the interim early September 2017 was given prednisone. When he came back again in late September 2017 which was one week ago with another exacerbation and again given prednisone. Visit lasted prednisone and is here for follow-up. He feels that he is largely better. He is worried that he needs prednisone repeatedly. He is not sure that he can manage when he comes off the prednisone but he is not a try. His daughter has similar problems after moving from [place] to [place] and she is on Xolair and has good control. Therefore he is questions on this. He says he is compliant with his inhalers.  Asthma control question test: This is a 4 week questionnaire but I asked him how he felt in  the last 2 days. He feels most of the time his asthma prevents him from getting his work done.Marland Kitchen He feels dyspneic more than once a day. He wakes up frequently at night. He is using albuterol rescue one or 2 times daily. He feels his asthma is poorly controlled despite d6 of prednisone and chest very tight   Exhaled nitric oxide today: While on prednisone 6th day is elevated at 80pbo    09/19/2016 Acute OV p  : Asthma  Patient returns for an acute office visit for persistent wheezing and cough .  Seen  3 weeks ago for asthma flare. Started on medrol dose pack and zpack . Felt some better for couple of weeks then symptoms returned with dry cough and wheezing  Increased SABA use for last 2-3 days.  FeNO today is 56 . (no steroids for 2 weeks) .    46 yo male Never smoker seen for pulmonary consult 06/12/2016 for shortness of breath and asthma evaluation.   PFT 06/15/16  showed an FEV1 at 93%, ratio 83, FVC 88%, no significant bronchodilator response. Positive mid flow reversibility. DLCO 129% 07/27/16  IgE at, 254 + rast-tree/grass.  , High eosinophils on Diff , FeNO was elevated at 87  92017 CT chest done polylobar areas of scarring likely from previous pna, mild nodularity along lingula ? Scarring. No ILD changes.     He remains on BREO , Spiriva, Singulair , Xyzal and Nasonex .  Nucala paperwork has been started. Awaiting approval . We checked on process, waiting on PA .  Denies fever, chest pain, orthopnea , edema or hemoptysis, or discolored mucus.   Patient was seen for pulmonary consult 06/15/2016 for asthma evaluation. Patient was having active asthma flare and was on prednisone taper. Marland Kitchen He was set up for a pulmonary function test on 06/15/2016 that showed significant improvement in lung function with an FEV1 at 93%, ratio 83% and no significant bronchodilator response. Previous spirometry had showed significant airflow obstructions with FEV1 ranging from 35-53%. It was felt that his  lung function improved because of prednisone c/w asthma . He was being followed by Dr. Donneta Romberg , Allergist. He was previously on allergy vaccines but was stopped due to persistent asthma flares. Says he had no breathing problems until he moved here from [facility] 6 yrs ago. Previously ran marathons .   OV 10/24/2016  Chief Complaint  Patient presents with  . Follow-up    Pt  saw TP on 10.31.17 for an acute visit. Pt c/o nonprod cough with intermittent chest congestion, increase in SOB. Pt deneis CP/tightness and f/c/s. Pt states he feels he worsens when he comes off the prednisone.      Follow-up for eosinophilic asthma  He still waiting for his interleukin-5 receptor antibody treatment to start. It is currently been approved for the shipment is pending. He tells me that he is unable to come off prednisone completely. Currently he is off prednisone for 2 weeks and he is insidious exacerbation with increased nocturnal symptoms, daily albuterol use multiple times and wheezing and chest tightness. He feels he needs another prednisone. He does not want to do daily prednisone. Preference is to start Nucala and see if he can avoid prednisone. He is frustrated    has a past medical history of Allergic rhinitis, cause unspecified; Asthma; Erectile dysfunction; Irritable bowel syndrome (IBS); and Onychomycosis.   reports that he has never smoked. He has never used smokeless tobacco.  OV 12/25/2016  Chief Complaint  Patient presents with  . Follow-up    Pt states since being on the Nucala his cough has improved but states his SOB has worsened and is using his nebs more. Pt states he has upper left chest pain infrequently.    Fu eos ashma  - s/p nucala start 12/20/2017Positive IgE at 284 and significant allergy test positive but currently not on allergy shots  He's completed 2 doses of monthly interleukin-5 receptor antibody treatment for asthma (nucal he says this is improved his cough). He also tells me  that he has not been on prednisone since strting nucala but according to chart review he did take one episode of prednisone January 2018. He does not recollect this. He tells me that cough is significantly improved since starting  nucala but he still gets episodes of chest tightness particularly wakes him up from sleeping. He is using albuterol rescue 3 times a day which she thinks is a little more than his baseline is no fever or sputum production is not any allergy shots. He tells me is compliant with the Brio and Singulair and Spiriva     exhaled nitric oxide today is elevated at 80 ppb   he does have a lung nodule from sept 2017  has a past medical history of Allergic rhinitis, cause unspecified; Asthma; Erectile dysfunction; Irritable bowel syndrome (IBS); and Onychomycosis.   reports that he has never smoked. He has never used smokeless tobacco.  Past Surgical History:  Procedure Laterality Date  . APPENDECTOMY  age 41  . SEPTOPLASTY  approx age 28  . SHOULDER SURGERY Right     Allergies  Allergen Reactions  . Aspirin Other (See Comments)    Not actual allergy, had chronic nosebleeds as a child and was instructed not to use aspirin.    Immunization History  Administered Date(s) Administered  . Influenza Split 09/12/2012, 08/29/2014, 09/05/2016  . Influenza,inj,Quad PF,36+ Mos 10/09/2013, 09/03/2015  . Pneumococcal Polysaccharide-23 05/05/2015  . Tdap 10/04/2010, 04/22/2014    Family History  Problem Relation Age of Onset  . Cancer Mother     lung cancer, smoker  . Alcohol abuse Mother   . Cancer Father     stomach cancer, smoker  . Alcohol abuse Father   . Asthma Daughter   . Diabetes Neg Hx   . Heart disease Neg Hx      Current Outpatient Prescriptions:  .  albuterol (PROVENTIL) (2.5 MG/3ML) 0.083% nebulizer solution, Take 3  mLs (2.5 mg total) by nebulization every 6 (six) hours as needed for wheezing or shortness of breath., Disp: 75 mL, Rfl: 3 .  EPIPEN 2-PAK  0.3 MG/0.3ML SOAJ injection, Use as directed, Disp: 1 Device, Rfl: 1 .  fluticasone furoate-vilanterol (BREO ELLIPTA) 200-25 MCG/INH AEPB, Inhale 1 puff into the lungs daily., Disp: , Rfl:  .  levocetirizine (XYZAL) 5 MG tablet, Take 5 mg by mouth every evening., Disp: , Rfl:  .  mometasone (NASONEX) 50 MCG/ACT nasal spray, Place 2 sprays into the nose 2 (two) times daily. Reported on 05/17/2016, Disp: 17 g, Rfl: 4 .  montelukast (SINGULAIR) 10 MG tablet, Take 10 mg by mouth. Reported on 03/15/2016, Disp: , Rfl:  .  PROAIR RESPICLICK 123XX123 (90 Base) MCG/ACT AEPB, Inhale 2 puffs into the lungs every 4 (four) hours as needed., Disp: 1 each, Rfl: 3 .  sildenafil (VIAGRA) 100 MG tablet, Take 0.5-1 tablets (50-100 mg total) by mouth daily as needed for erectile dysfunction., Disp: 5 tablet, Rfl: 11 .  SPIRIVA RESPIMAT 2.5 MCG/ACT AERS, INHALE 2 PUFFS INTO THE LUNGS DAILY., Disp: 1 Inhaler, Rfl: 5  Current Facility-Administered Medications:  Marland Kitchen  Mepolizumab SOLR 100 mg, 100 mg, Subcutaneous, Q28 days, Brand Males, MD, 100 mg at 11/10/16 1211   Review of Systems     Objective:   Physical Exam  Constitutional: He is oriented to person, place, and time. He appears well-developed and well-nourished. No distress.  HENT:  Head: Normocephalic and atraumatic.  Right Ear: External ear normal.  Left Ear: External ear normal.  Mouth/Throat: Oropharynx is clear and moist. No oropharyngeal exudate.  Eyes: Conjunctivae and EOM are normal. Pupils are equal, round, and reactive to light. Right eye exhibits no discharge. Left eye exhibits no discharge. No scleral icterus.  Neck: Normal range of motion. Neck supple. No JVD present. No tracheal deviation present. No thyromegaly present.  Cardiovascular: Normal rate, regular rhythm and intact distal pulses.  Exam reveals no gallop and no friction rub.   No murmur heard. Pulmonary/Chest: Effort normal. No respiratory distress. He has wheezes. He has no rales. He  exhibits no tenderness.  Abdominal: Soft. Bowel sounds are normal. He exhibits no distension and no mass. There is no tenderness. There is no rebound and no guarding.  Musculoskeletal: Normal range of motion. He exhibits no edema or tenderness.  Lymphadenopathy:    He has no cervical adenopathy.  Neurological: He is alert and oriented to person, place, and time. He has normal reflexes. No cranial nerve deficit. Coordination normal.  Skin: Skin is warm and dry. No rash noted. He is not diaphoretic. No erythema. No pallor.  Psychiatric: He has a normal mood and affect. His behavior is normal. Judgment and thought content normal.  Nursing note and vitals reviewed.   Vitals:   12/25/16 0911  BP: 112/78  Pulse: 69  SpO2: 95%  Height: 5\' 11"  (1.803 m)   Estimated body mass index is 28.87 kg/m as calculated from the following:   Height as of 10/24/16: 5\' 11"  (1.803 m).   Weight as of 10/24/16: 207 lb (93.9 kg).      Assessment:       ICD-9-CM ICD-10-CM   1. Moderate persistent asthma with acute exacerbation 493.92 J45.41   2. 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 Elevated IgE level 795.79 R76.8   4. History of environmental allergies V15.09 Z91.09   5. Lung nodule 793.11 R91.1    Unimproved.   Despite going on interleukin-5 receptor antibody  subcutaneous injection therapy his exam nitric oxide is not improved at all and is actually wheezing today. This might be that he needs more time for improvement or he is resistant to Nuca at this point is to go with prednisone and watch approach. Certainly he is not improving in the future then add on therapy such as Xolair versus allergy shots would be considered .     Plan:      Take prednisone 40 mg daily x 2 days, then 20mg  daily x 2 days, then 10mg  daily x 2 days, then 5mg  daily x 2 days and stop  Continue nucala, spiriva, breo, singulair scheduled mainteance  Followup Do CBC with diff April 2018 Dp CT chest wo contrast in April 2018 -  followup nodule from sept 2017 Return to see me after above  - wil discuss xolair v allergy shot addon therapy if above strategy is not working well  > 50% of this > 25 min visit spent in face to face counseling or coordination of care    Dr. Brand Males, M.D., [facility].C.P Pulmonary and Critical Care Medicine Staff Physician [facility] Pager: [phone], If no answer or between  15:00h - 7:00h: call [phone]  12/25/2016 9:26 AM

## 2016-12-25 NOTE — Addendum Note (Signed)
Addended by: Collier Salina on: 12/25/2016 05:40 PM   Modules accepted: Orders

## 2016-12-25 NOTE — Addendum Note (Signed)
Addended by: Collier Salina on: 12/25/2016 11:07 AM   Modules accepted: Orders

## 2016-12-25 NOTE — Patient Instructions (Addendum)
ICD-9-CM ICD-10-CM   1. Moderate persistent asthma with acute exacerbation 493.92 J45.41   2. 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 Elevated IgE level 795.79 R76.8   4. History of environmental allergies V15.09 Z91.09   5. Lung nodule 793.11 R91.1     Take prednisone 40 mg daily x 2 days, then 20mg  daily x 2 days, then 10mg  daily x 2 days, then 5mg  daily x 2 days and stop  Continue nucala, spiriva, breo, singulair scheduled mainteance  Followup Do CBC with diff April 2018 Dp CT chest wo contrast in April 2018 - followup nodule from sept 2017 Return to see me after above  - wil discuss xolair v allergy shot addon therapy if above strategy is not working well

## 2017-01-09 ENCOUNTER — Telehealth: Payer: Self-pay | Admitting: Internal Medicine

## 2017-01-09 DIAGNOSIS — J4540 Moderate persistent asthma, uncomplicated: Secondary | ICD-10-CM | POA: Diagnosis not present

## 2017-01-09 NOTE — Telephone Encounter (Signed)
Vials: 1 Order Date: 01/09/17 Ship Date: 01/10/17

## 2017-01-11 NOTE — Telephone Encounter (Signed)
Arrival Date: 01/11/17 # of Vials: 1 Lot #: [id number] Expiration Date: 3/19

## 2017-01-12 ENCOUNTER — Ambulatory Visit (INDEPENDENT_AMBULATORY_CARE_PROVIDER_SITE_OTHER): Payer: 59

## 2017-01-12 DIAGNOSIS — J4540 Moderate persistent asthma, uncomplicated: Secondary | ICD-10-CM

## 2017-01-15 ENCOUNTER — Telehealth: Payer: Self-pay | Admitting: Internal Medicine

## 2017-01-15 NOTE — Telephone Encounter (Signed)
Attempted to contact pt. No answer, no option to leave a message. Will try back.  

## 2017-01-16 MED ORDER — ALBUTEROL SULFATE (2.5 MG/3ML) 0.083% IN NEBU: 2.5000 mg | INHALATION_SOLUTION | Freq: Four times a day (QID) | RESPIRATORY_TRACT | 3 refills | Status: DC | PRN

## 2017-01-16 NOTE — Telephone Encounter (Signed)
Pt requesting a refill of his Albuterol nebulizer and Prednisone 10mg  taper from last OV 12/25/16  Per AVS pt was written for 40 mg daily x 2 days, then 20mg  daily x 2 days, then 10mg  daily x 2 days, then 5mg  daily x 2 days and stop. Pt is requesting a refill of this d/t cough/SOB.   Albuterol has been refilled already to [facility]. Pt aware that we will call once we talk to MR about the Prednisone. Please advise Dr Chase Caller. Thanks.

## 2017-01-16 NOTE — Telephone Encounter (Signed)
Pt returning call.Seth Kerr ° °

## 2017-01-16 NOTE — Telephone Encounter (Signed)
Pt aware that Dr Chase Caller was not available this afternoon to respond to our message.  Pt states that he is having to use his nebulizer tx frequently, 2 breathing treatments a day.  Some chest tightness. Pt states that he just wants this to have on hand if his symptoms worsen.

## 2017-01-16 NOTE — Telephone Encounter (Signed)
lmtcb X1 for pt  

## 2017-01-17 MED ORDER — PREDNISONE 10 MG PO TABS: ORAL_TABLET | ORAL | 0 refills | Status: DC

## 2017-01-17 NOTE — Telephone Encounter (Signed)
Called spoke with patient, advised of MR's recommendations as stated below Pt voiced her understanding Pt stated he does NOT need the Rx for his albuterol nebulizer as this was just filled yesterday Pt understands about the Nucala and possibility of added Xolair on top or in replacement of the Nucala Rx sent to verified pharmacy Pt aware to call the office for return or worsening symptoms Nothing further needed; will sign off

## 2017-01-17 NOTE — Telephone Encounter (Signed)
Please let Destin Jonasson know  1. Ok for prednsione taper as requested  2. Ok for refill of albuterol nebulizer  3. Please let him know that I think Nucala might not be working well for him and will have to consider adding xolair injections on top or instead off  Thanks  Dr. Brand Males, M.D., [facility].C.P Pulmonary and Critical Care Medicine Staff Physician [facility] Pager: [phone], If no answer or between  15:00h - 7:00h: call [phone]  01/17/2017 4:14 PM

## 2017-01-18 MED ORDER — MEPOLIZUMAB 100 MG ~~LOC~~ SOLR
100.0000 mg | Freq: Once | SUBCUTANEOUS | Status: AC
  Administered 2017-01-12: 100 mg via SUBCUTANEOUS

## 2017-01-30 ENCOUNTER — Telehealth: Payer: Self-pay | Admitting: Internal Medicine

## 2017-01-30 DIAGNOSIS — J4540 Moderate persistent asthma, uncomplicated: Secondary | ICD-10-CM | POA: Diagnosis not present

## 2017-01-30 NOTE — Telephone Encounter (Addendum)
Vials: 1 Order Date: 01/30/17 Ship Date: 02/01/17

## 2017-02-02 NOTE — Telephone Encounter (Signed)
Arrival Date: 02/02/17 # of Vials: 1 Lot #: [id number] Expiration Date: 3/19

## 2017-02-09 ENCOUNTER — Ambulatory Visit (INDEPENDENT_AMBULATORY_CARE_PROVIDER_SITE_OTHER): Payer: 59

## 2017-02-09 DIAGNOSIS — J4540 Moderate persistent asthma, uncomplicated: Secondary | ICD-10-CM | POA: Diagnosis not present

## 2017-02-13 MED ORDER — MEPOLIZUMAB 100 MG ~~LOC~~ SOLR: 100.0000 mg | Freq: Once | SUBCUTANEOUS | Status: DC

## 2017-02-13 MED ORDER — MEPOLIZUMAB 100 MG ~~LOC~~ SOLR
100.0000 mg | SUBCUTANEOUS | Status: DC
  Administered 2017-02-09: 100 mg via SUBCUTANEOUS

## 2017-02-20 ENCOUNTER — Inpatient Hospital Stay: Admission: RE | Admit: 2017-02-20 | Payer: 59 | Source: Ambulatory Visit

## 2017-02-21 ENCOUNTER — Ambulatory Visit (INDEPENDENT_AMBULATORY_CARE_PROVIDER_SITE_OTHER)
Admission: RE | Admit: 2017-02-21 | Discharge: 2017-02-21 | Disposition: A | Discharge status: Home / Self Care | Payer: 59 | Source: Ambulatory Visit | Attending: Internal Medicine | Admitting: Internal Medicine

## 2017-02-21 DIAGNOSIS — R911 Solitary pulmonary nodule: Secondary | ICD-10-CM | POA: Diagnosis not present

## 2017-02-21 DIAGNOSIS — J45909 Unspecified asthma, uncomplicated: Secondary | ICD-10-CM | POA: Diagnosis not present

## 2017-02-28 ENCOUNTER — Other Ambulatory Visit (INDEPENDENT_AMBULATORY_CARE_PROVIDER_SITE_OTHER): Payer: 59

## 2017-02-28 DIAGNOSIS — D721 Eosinophilia, unspecified: Secondary | ICD-10-CM

## 2017-02-28 DIAGNOSIS — J4540 Moderate persistent asthma, uncomplicated: Secondary | ICD-10-CM

## 2017-02-28 DIAGNOSIS — R768 Other specified abnormal immunological findings in serum: Secondary | ICD-10-CM

## 2017-02-28 LAB — CBC WITH DIFFERENTIAL/PLATELET
BASOS ABS: 0 10*3/uL (ref 0.0–0.1)
Basophils Relative: 0.8 % (ref 0.0–3.0)
EOS PCT: 2.2 % (ref 0.0–5.0)
Eosinophils Absolute: 0.1 10*3/uL (ref 0.0–0.7)
HCT: 45.3 % (ref 39.0–52.0)
HEMOGLOBIN: 15.5 g/dL (ref 13.0–17.0)
Lymphocytes Relative: 31.3 % (ref 12.0–46.0)
Lymphs Abs: 1.5 10*3/uL (ref 0.7–4.0)
MCHC: 34.2 g/dL (ref 30.0–36.0)
MCV: 88 fl (ref 78.0–100.0)
MONOS PCT: 15.7 % — AB (ref 3.0–12.0)
Monocytes Absolute: 0.8 10*3/uL (ref 0.1–1.0)
Neutro Abs: 2.4 10*3/uL (ref 1.4–7.7)
Neutrophils Relative %: 50 % (ref 43.0–77.0)
Platelets: 248 10*3/uL (ref 150.0–400.0)
RBC: 5.15 Mil/uL (ref 4.22–5.81)
RDW: 12.9 % (ref 11.5–15.5)
WBC: 4.8 10*3/uL (ref 4.0–10.5)

## 2017-03-01 ENCOUNTER — Telehealth: Payer: Self-pay | Admitting: Internal Medicine

## 2017-03-01 NOTE — Telephone Encounter (Signed)
Notes recorded by Elie Confer, CMA on 03/01/2017 at 11:40 AM EDT lmomtcb x 1 for the pt to discuss results. ------  Notes recorded by Brand Males, MD on 02/28/2017 at 2:35 AM EDT  IMPRESSION: No evidence of acute cardiopulmonary disease.  Scattered areas of scarring, benign.  Pt aware of results. Pt is aware of his appt on Monday and that we are waiting for results of his labs to come through. Nothing more needed at this time.

## 2017-03-02 ENCOUNTER — Telehealth: Payer: Self-pay | Admitting: Internal Medicine

## 2017-03-02 DIAGNOSIS — J4540 Moderate persistent asthma, uncomplicated: Secondary | ICD-10-CM | POA: Diagnosis not present

## 2017-03-02 NOTE — Telephone Encounter (Addendum)
Vials: 1 Order Date: 03/02/17 Ship Date: 03/06/18

## 2017-03-05 ENCOUNTER — Encounter: Payer: Self-pay | Admitting: Internal Medicine

## 2017-03-05 ENCOUNTER — Ambulatory Visit (INDEPENDENT_AMBULATORY_CARE_PROVIDER_SITE_OTHER): Payer: 59 | Admitting: Internal Medicine

## 2017-03-05 DIAGNOSIS — Z9109 Other allergy status, other than to drugs and biological substances: Secondary | ICD-10-CM

## 2017-03-05 DIAGNOSIS — D721 Eosinophilia, unspecified: Secondary | ICD-10-CM

## 2017-03-05 DIAGNOSIS — R768 Other specified abnormal immunological findings in serum: Secondary | ICD-10-CM

## 2017-03-05 DIAGNOSIS — R911 Solitary pulmonary nodule: Secondary | ICD-10-CM | POA: Diagnosis not present

## 2017-03-05 DIAGNOSIS — J4540 Moderate persistent asthma, uncomplicated: Secondary | ICD-10-CM | POA: Diagnosis not present

## 2017-03-05 MED ORDER — PREDNISONE 10 MG PO TABS: ORAL_TABLET | ORAL | 0 refills | Status: DC

## 2017-03-05 NOTE — Assessment & Plan Note (Signed)
Per discussion will hold off on going back to allergy shots (did not work in past)

## 2017-03-05 NOTE — Assessment & Plan Note (Signed)
Given poor control of asthma will pursue Xolair as add-on Rx. Discussed risk, benefits and limitations

## 2017-03-05 NOTE — Progress Notes (Signed)
Subjective:     Patient ID: Seth Kerr, male   DOB: December 07, 1970, 46 y.o.   MRN: [id number]  HPI     IOV 06/12/2016  Chief Complaint  Patient presents with  . PULMONARY CONSULT    Referred by Dr. Mosetta Anis. Pt reports increasing breathing issues since moving to [place] 6 years ago. Pt had recent pna in April. Pt c/o continued SOB and chest tightness that is worse with exertion and some cough at night. Pt denies cough. Pt is on Breo and albuterol HFA. Pt is also on oral prednisone taper currently. Pt has significant second hand smoke exposure. Pt is on allergy injections weekly.     46 year old male works at [employer]. He is been referred by Dr. Donneta Romberg for evaluation of shortness of breath in the setting of confirmed diagnosis of asthma and allergies  History is gained from talking to the patient and also review of the referral notes. He tells me that he worked in the [employer] area. When he lived there he had no prior diagnosis of asthma respirator complaints. Moved to [facility] approximately 6 years ago and then within several months started noticing insidious onset of shortness of breath and chest tightness. For the last 4-5 years he has been seeing Dr. Donneta Romberg with a confirmed diagnosis of asthma. He was then started on inhaled steroids and Singulair. With this he also had allergy testing which was positive. He says once his treatments commenced he was able to return to normal activities of daily living including running long distances. He felt well. Approximately one year ago he stopped his allergy shots. Then in April 2017 he says he had a pneumonia for which she was treated with antibiotics. There is a chest x-ray report from 03/08/2016 that reports a nodular focus in the left lower lobe which could her percent focus of pneumonia but could not exclude a pulmonary nodule. I personally visualized this film and agree. He had follow-up chest x-ray 05/05/2016 that showed  improvement. He says that he was beginning to feel better but then started having nocturnal symptoms again with shortness of breath. Shortness of breath is exertional. His been going on for the last few weeks. He is now unable to climb stairs without feeling shortness of breath. Currently is in the middle of a prednisone taper  In terms of his allergies: He had repeat allergy testing April 2017 and it is positive for different environmental allergens. He has now been restarted on allergy shots.  He has had spirometry with Dr. Donneta Romberg. In 07/21/2015 FEV1 1.44 L/35% with a ratio 68 showing severe obstruction. Follow-up 03/08/2016 on the time of pneumonia FEV1 2.2 L/53% with a ratio of 79 with a 13% bronchodilator response. Again showing obstruction of moderate degree. He had a repeat spirometry 03/17/2016 which is only prebronchodilator that showed further improvement FEV1 2.68 L/64% ratio 79. And then again 06/05/2016 he had no further improvement with a post bronchodilator response FEV1 of 2.5 L/61 ratio 80 which is a 15% bronchi were to response. Exhaled nitric oxide on the same day was elevated at 59 ppb. Due to obstructive nature of the spirometry is been referred here.  Walking desaturation test on an 85 feet 3 laps on room at: Did not desaturate below 97%. NOTED BRADYCARDIA - HR 60s entire walk- walked fast  Exhaled nitric oxide today in our office: 26 ppb and normal (was 59 on 06/05/16 @ Dr Donneta Romberg in office )   Noted:  he is in middle of a 12d prednisone taper - 5 days left    has a past medical history of Allergic rhinitis, cause unspecified; Asthma; Erectile dysfunction; Irritable bowel syndrome (IBS); and Onychomycosis.   reports that he has never smoked. He has never used smokeless tobacco.   PFT 06/15/16  showed an FEV1 at 93%, ratio 83, FVC 88%, no significant bronchodilator response. Positive mid flow reversibility. DLCO 129%     07/27/2016 Acute OV  : Asthma  Patient returns for an  acute office visit. Complains for last 1.5 weeks that he has dry cough , wheezing, tightness , and dyspnea. Increased SABA use.  Patient was seen for pulmonary consult 06/15/2016 for asthma evaluation. Patient was having active asthma flare and was on prednisone taper. Marland Kitchen He was set up for a pulmonary function test on 06/15/2016 that showed significant improvement in lung function with an FEV1 at 93%, ratio 83% and no significant bronchodilator response. Previous spirometry had showed significant airflow obstructions with FEV1 ranging from 35-53%. It was felt that his lung function improved because of prednisone c/w asthma . He was being followed by Dr. Donneta Romberg , Allergist. He was previously on allergy vaccines but was stopped due to persistent asthma flares. Says he had no breathing problems until he moved here from [facility] 6 yrs ago. Previously ran marathons .  Exhaled nitric oxide test was 26. Previously had been 66. Today FENO is 87  - 07/27/16 Chart review shows very high eosinophils . CXR in April 2017 showed LLL nodular focus ? Pna .  Follow up cxr showed improvement in June.  Spiriva was added to his BREO last ov.    08/16/2016 Acute OV p  : Asthma  Patient returns for an acute office visit for persistent wheezing and cough .  Seen  2 weeks ago for asthma flare. Started on Steroid taper .  Felt some better but never stopped wheezing.  Labs showed IgE at, 254 + rast-tree/grass.  , High eosinophils on Diff , FeNO was elevated at 87  CXR showed mild opacity at left lung base.  CT chest done polylobar areas of scarring likely from previous pna, mild nodularity along lingula ? Scarring. No ILD changes.  He remains on BREO , Spiriva, Singulair , Xyzal and Nasonex .  Cough is keeping him up at night . Denies fever, chest pain, orthopnea , edema or hemoptysis, or discolored mucus.   Patient was seen for pulmonary consult 06/15/2016 for asthma evaluation. Patient was having active asthma flare and  was on prednisone taper. Marland Kitchen He was set up for a pulmonary function test on 06/15/2016 that showed significant improvement in lung function with an FEV1 at 93%, ratio 83% and no significant bronchodilator response. Previous spirometry had showed significant airflow obstructions with FEV1 ranging from 35-53%. It was felt that his lung function improved because of prednisone c/w asthma . He was being followed by Dr. Donneta Romberg , Allergist. He was previously on allergy vaccines but was stopped due to persistent asthma flares. Says he had no breathing problems until he moved here from [facility] 6 yrs ago. Previously ran marathons .     OV 08/23/2016  Chief Complaint  Patient presents with  . 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 Since then he had a CT chest and allergy evaluation. What is significant is that he is significantly high used to this. CT chest is clean. He's not had CT sinus but he does not have sinus problems. His exhaled nitric oxide has been elevated during exacerbations. He saw nurse practitioner in the interim early September 2017 was given prednisone. When he came back again in late September 2017 which was one week ago with another exacerbation and again given prednisone. Visit lasted prednisone and is here for follow-up. He feels that he is largely better. He is worried that he needs prednisone repeatedly. He is not sure that he can manage when he comes off the prednisone but he is not a try. His daughter has similar problems after moving from [place] to [place] and she is on Xolair and has good control. Therefore he is questions on this. He says he is compliant with his inhalers.  Asthma control question test: This is a 4 week questionnaire but I asked him how he felt in  the last 2 days. He feels most of the time his asthma prevents him from getting his work done.Marland Kitchen He feels dyspneic more than once a day. He wakes up frequently at night. He is using albuterol rescue one or 2 times daily. He feels his asthma is poorly controlled despite d6 of prednisone and chest very tight   Exhaled nitric oxide today: While on prednisone 6th day is elevated at 80pbo    09/19/2016 Acute OV p  : Asthma  Patient returns for an acute office visit for persistent wheezing and cough .  Seen  3 weeks ago for asthma flare. Started on medrol dose pack and zpack . Felt some better for couple of weeks then symptoms returned with dry cough and wheezing  Increased SABA use for last 2-3 days.  FeNO today is 56 . (no steroids for 2 weeks) .    46 yo male Never smoker seen for pulmonary consult 06/12/2016 for shortness of breath and asthma evaluation.   PFT 06/15/16  showed an FEV1 at 93%, ratio 83, FVC 88%, no significant bronchodilator response. Positive mid flow reversibility. DLCO 129% 07/27/16  IgE at, 254 + rast-tree/grass.  , High eosinophils on Diff , FeNO was elevated at 87  92017 CT chest done polylobar areas of scarring likely from previous pna, mild nodularity along lingula ? Scarring. No ILD changes.     He remains on BREO , Spiriva, Singulair , Xyzal and Nasonex .  Nucala paperwork has been started. Awaiting approval . We checked on process, waiting on PA .  Denies fever, chest pain, orthopnea , edema or hemoptysis, or discolored mucus.   Patient was seen for pulmonary consult 06/15/2016 for asthma evaluation. Patient was having active asthma flare and was on prednisone taper. Marland Kitchen He was set up for a pulmonary function test on 06/15/2016 that showed significant improvement in lung function with an FEV1 at 93%, ratio 83% and no significant bronchodilator response. Previous spirometry had showed significant airflow obstructions with FEV1 ranging from 35-53%. It was felt that his  lung function improved because of prednisone c/w asthma . He was being followed by Dr. Donneta Romberg , Allergist. He was previously on allergy vaccines but was stopped due to persistent asthma flares. Says he had no breathing problems until he moved here from [facility] 6 yrs ago. Previously ran marathons .   OV 10/24/2016  Chief Complaint  Patient presents with  . Follow-up  Pt saw TP on 10.31.17 for an acute visit. Pt c/o nonprod cough with intermittent chest congestion, increase in SOB. Pt deneis CP/tightness and f/c/s. Pt states he feels he worsens when he comes off the prednisone.      Follow-up for eosinophilic asthma  He still waiting for his interleukin-5 receptor antibody treatment to start. It is currently been approved for the shipment is pending. He tells me that he is unable to come off prednisone completely. Currently he is off prednisone for 2 weeks and he is insidious exacerbation with increased nocturnal symptoms, daily albuterol use multiple times and wheezing and chest tightness. He feels he needs another prednisone. He does not want to do daily prednisone. Preference is to start Nucala and see if he can avoid prednisone. He is frustrated    has a past medical history of Allergic rhinitis, cause unspecified; Asthma; Erectile dysfunction; Irritable bowel syndrome (IBS); and Onychomycosis.   reports that he has never smoked. He has never used smokeless tobacco.  OV 12/25/2016  Chief Complaint  Patient presents with  . Follow-up    Pt states since being on the Nucala his cough has improved but states his SOB has worsened and is using his nebs more. Pt states he has upper left chest pain infrequently.    Fu eos ashma  - s/p nucala start 11/08/2016/ Positive IgE at 284 and significant allergy test positive but currently not on allergy shots  He's completed 2 doses of monthly interleukin-5 receptor antibody treatment for asthma (nucal he says this is improved his cough). He also tells  me that he has not been on prednisone since strting nucala but according to chart review he did take one episode of prednisone January 2018. He does not recollect this. He tells me that cough is significantly improved since starting  nucala but he still gets episodes of chest tightness particularly wakes him up from sleeping. He is using albuterol rescue 3 times a day which she thinks is a little more than his baseline is no fever or sputum production is not any allergy shots. He tells me is compliant with the Brio and Singulair and Spiriva     exhaled nitric oxide today is elevated at 80 ppb   he does have a lung nodule from sept 2017  Steger 03/05/2017  Chief Complaint  Patient presents with  . Follow-up    2 month follow up after CT scan and blood work. States he is not coughing at much as he used to.      Follow-up very poorly controlled asthma despite compliance associated with high IgE and I eosinophilia.  He is on interleukin-5 receptor antibody injectable therapy. He says this is only helped his cough but overall there is no improvement and shortness of breath the frequency of asthma exacerbations her sense of well-being are similar to scale down his Brio, Singulair and Spiriva treatment. He just does not feel optimized. He feels he is wheezing all the time in fact today also is wheezing. He wants a 5 day prednisone. Most recently had CT chest that did not show lung nodule and ruled out ABPA. His blood eosinophilic count is improved with injectable therapy    Results for TYRRELL, STEPHENS (MRN [id number]) as of 03/05/2017 14:12  Ref. Range 06/15/2016 12:49 06/29/2016 12:45 07/27/2016 16:55 07/27/2016 17:03 07/27/2016 17:04 08/15/2016 10:04 08/23/2016 16:14 09/19/2016 09:30 12/25/2016 09:00 02/21/2017 09:31 02/28/2017 09:33  Eosinophils Absolute Latest Ref Range: 0.0 - 0.7 K/uL    1.9 (H)  0.1      has a past medical history of Allergic rhinitis, cause unspecified; Asthma; Erectile dysfunction; Irritable  bowel syndrome (IBS); and Onychomycosis.   reports that he has never smoked. He has never used smokeless tobacco.  Past Surgical History:  Procedure Laterality Date  . APPENDECTOMY  age 22  . SEPTOPLASTY  approx age 48  . SHOULDER SURGERY Right     Allergies  Allergen Reactions  . Aspirin Other (See Comments)    Not actual allergy, had chronic nosebleeds as a child and was instructed not to use aspirin.    Immunization History  Administered Date(s) Administered  . Influenza Split 09/12/2012, 08/29/2014, 09/05/2016  . Influenza,inj,Quad PF,36+ Mos 10/09/2013, 09/03/2015  . Pneumococcal Polysaccharide-23 05/05/2015  . Tdap 10/04/2010, 04/22/2014    Family History  Problem Relation Age of Onset  . Cancer Mother     lung cancer, smoker  . Alcohol abuse Mother   . Cancer Father     stomach cancer, smoker  . Alcohol abuse Father   . Asthma Daughter   . Diabetes Neg Hx   . Heart disease Neg Hx      Current Outpatient Prescriptions:  .  albuterol (PROVENTIL) (2.5 MG/3ML) 0.083% nebulizer solution, Take 3 mLs (2.5 mg total) by nebulization every 6 (six) hours as needed for wheezing or shortness of breath., Disp: 75 mL, Rfl: 3 .  EPIPEN 2-PAK 0.3 MG/0.3ML SOAJ injection, Use as directed, Disp: 1 Device, Rfl: 1 .  fluticasone furoate-vilanterol (BREO ELLIPTA) 200-25 MCG/INH AEPB, Inhale 1 puff into the lungs daily., Disp: , Rfl:  .  levocetirizine (XYZAL) 5 MG tablet, Take 5 mg by mouth every evening., Disp: , Rfl:  .  mometasone (NASONEX) 50 MCG/ACT nasal spray, Place 2 sprays into the nose 2 (two) times daily. Reported on 05/17/2016, Disp: 17 g, Rfl: 4 .  montelukast (SINGULAIR) 10 MG tablet, Take 10 mg by mouth. Reported on 03/15/2016, Disp: , Rfl:  .  PROAIR RESPICLICK 027 (90 Base) MCG/ACT AEPB, Inhale 2 puffs into the lungs every 4 (four) hours as needed., Disp: 1 each, Rfl: 3 .  sildenafil (VIAGRA) 100 MG tablet, Take 0.5-1 tablets (50-100 mg total) by mouth daily as needed  for erectile dysfunction., Disp: 5 tablet, Rfl: 11 .  SPIRIVA RESPIMAT 2.5 MCG/ACT AERS, INHALE 2 PUFFS INTO THE LUNGS DAILY., Disp: 1 Inhaler, Rfl: 5  Current Facility-Administered Medications:  Marland Kitchen  Mepolizumab SOLR 100 mg, 100 mg, Subcutaneous, Q28 days, Brand Males, MD, 100 mg at 02/09/17 1234    Review of Systems     Objective:   Physical Exam  Constitutional: He is oriented to person, place, and time. He appears well-developed and well-nourished. No distress.  HENT:  Head: Normocephalic and atraumatic.  Right Ear: External ear normal.  Left Ear: External ear normal.  Mouth/Throat: Oropharynx is clear and moist. No oropharyngeal exudate.  Eyes: Conjunctivae and EOM are normal. Pupils are equal, round, and reactive to light. Right eye exhibits no discharge. Left eye exhibits no discharge. No scleral icterus.  Neck: Normal range of motion. Neck supple. No JVD present. No tracheal deviation present. No thyromegaly present.  Cardiovascular: Normal rate, regular rhythm and intact distal pulses.  Exam reveals no gallop and no friction rub.   No murmur heard. Pulmonary/Chest: Effort normal. No respiratory distress. He has wheezes. He has no rales. He exhibits no tenderness.  Abdominal: Soft. Bowel sounds are normal. He exhibits no distension and no mass. There is no tenderness. There is  no rebound and no guarding.  Musculoskeletal: Normal range of motion. He exhibits no edema or tenderness.  Lymphadenopathy:    He has no cervical adenopathy.  Neurological: He is alert and oriented to person, place, and time. He has normal reflexes. No cranial nerve deficit. Coordination normal.  Skin: Skin is warm and dry. No rash noted. He is not diaphoretic. No erythema. No pallor.  Psychiatric: He has a normal mood and affect. His behavior is normal. Judgment and thought content normal.  Nursing note and vitals reviewed.  Vitals:   03/05/17 1405  BP: 116/80  Pulse: 66  SpO2: 95%  Weight: 199  lb 6.4 oz (90.4 kg)  Height: 5\' 11"  (1.803 m)    Estimated body mass index is 27.81 kg/m as calculated from the following:   Height as of this encounter: 5\' 11"  (1.803 m).   Weight as of this encounter: 199 lb 6.4 oz (90.4 kg).     Assessment:       ICD-9-CM ICD-10-CM   1. Very poorly controlled moderate persistent asthma 493.90 J45.40   2. 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 Elevated IgE level 795.79 R76.8   4. History of environmental allergies V15.09 Z91.09   5. Lung nodule 793.11 R91.1        Plan:     Very poorly controlled moderate persistent asthma stil wheezing. Unable to understand why so much symptoms despite nucala and reported compliance  Plan - Please take prednisone 40 mg x1 day, then 30 mg x1 day, then 20 mg x1 day, then 10 mg x1 day, and then 5 mg x1 day and stop - continue spiriva, breo, xyzal, singulair daily - see eosinophilia and  ige sections  Eosinophilia Blood eos have improved with nucala but not correlating with clinical improvement  Plan Continue nucala for now  Elevated IgE level Given poor control of asthma will pursue Xolair as add-on Rx. Discussed risk, benefits and limitations  History of environmental allergies Per discussion will hold off on going back to allergy shots (did not work in past)  Lung nodule Not present on most recent ct chest No active followup  3 month followup    Dr. Brand Males, M.D., [facility].C.P Pulmonary and Critical Care Medicine Staff Physician [place] [facility] Pager: [phone], If no answer or between  15:00h - 7:00h: call [phone]  03/05/2017 2:23 PM

## 2017-03-05 NOTE — Assessment & Plan Note (Signed)
stil wheezing. Unable to understand why so much symptoms despite nucala and reported compliance  Plan - Please take prednisone 40 mg x1 day, then 30 mg x1 day, then 20 mg x1 day, then 10 mg x1 day, and then 5 mg x1 day and stop - continue spiriva, breo, xyzal, singulair daily - see eosinophilia and  ige sections

## 2017-03-05 NOTE — Assessment & Plan Note (Signed)
Not present on most recent ct chest No active followup

## 2017-03-05 NOTE — Assessment & Plan Note (Signed)
Blood eos have improved with nucala but not correlating with clinical improvement  Plan Continue nucala for now

## 2017-03-05 NOTE — Patient Instructions (Addendum)
Very poorly controlled moderate persistent asthma stil wheezing. Unable to understand why so much symptoms despite nucala and reported compliance  Plan - Please take prednisone 40 mg x1 day, then 30 mg x1 day, then 20 mg x1 day, then 10 mg x1 day, and then 5 mg x1 day and stop - continue spiriva, breo, xyzal, singulair daily - see eosinophilia and  ige sections  Eosinophilia Blood eos have improved with nucala but not correlating with clinical improvement  Plan Continue nucala for now  Elevated IgE level Given poor control of asthma will pursue Xolair as add-on Rx. Discussed risk, benefits and limitations  History of environmental allergies Per discussion will hold off on going back to allergy shots (did not work in past)  Lung nodule Not present on most recent ct chest No active followup  followup 3 mnths or sooner ; FeNO at followup

## 2017-03-06 ENCOUNTER — Ambulatory Visit: Payer: 59 | Admitting: Internal Medicine

## 2017-03-06 LAB — ALPHA-1 ANTITRYPSIN PHENOTYPE: A-1 Antitrypsin: 119 mg/dL (ref 83–199)

## 2017-03-07 NOTE — Telephone Encounter (Signed)
Arrival Date: 03/07/17 # of Vials: 1 Lot #: [id number] Expiration Date: 3/19

## 2017-03-09 ENCOUNTER — Ambulatory Visit: Payer: 59

## 2017-03-12 ENCOUNTER — Telehealth: Payer: Self-pay | Admitting: *Deleted

## 2017-03-12 ENCOUNTER — Ambulatory Visit (INDEPENDENT_AMBULATORY_CARE_PROVIDER_SITE_OTHER): Payer: 59

## 2017-03-12 DIAGNOSIS — J4541 Moderate persistent asthma with (acute) exacerbation: Secondary | ICD-10-CM | POA: Diagnosis not present

## 2017-03-12 NOTE — Telephone Encounter (Signed)
Faxed [facility] & financial statement Fri. I called today and [facility] just sent the referral to [facility] today. The rep I spoke to said to call [facility] in a couple of days. Will leave encounter open to record that call.

## 2017-03-13 MED ORDER — MEPOLIZUMAB 100 MG ~~LOC~~ SOLR
100.0000 mg | SUBCUTANEOUS | Status: DC
  Administered 2017-03-12: 100 mg via SUBCUTANEOUS

## 2017-03-13 NOTE — Progress Notes (Signed)
Left message to contact office for medical results.

## 2017-03-13 NOTE — Progress Notes (Signed)
Nucala injection documentation and charges entered by Venisha Boehning, RMA, based on injection sheet filled out by Tammy Scott per office protocol.   

## 2017-03-13 NOTE — Progress Notes (Signed)
Left message for patient to contact office for medical results.

## 2017-03-26 NOTE — Telephone Encounter (Signed)
[facility] transferred pt.'s acct. To [facility]. [facility] will contact the pt. To get his premission to ship, then they will fax [place] new rx. Confirmation 03/16/17. I called back 03/20/17 and there was a drop in the system. Waiting for [facility] to call back or fax. I called back 03/21/17 because our fax was down for a wk. And a half. The rep. Transferred me to Robin a pharmacist I gave her a verbal rx she said she was going to expedite it. She talked like we would be able to set up shipment 3:15 03/22/17. No such luck.

## 2017-03-29 NOTE — Telephone Encounter (Signed)
[facility] denied our request for xolair. Saying coverage will not be provided if it is going to be used in combination with Nucala. ( an anti-interleukin (IL) monoclonal antibody).  Coverage cannot be authorized at this time. We can appeal it, I'm going to need your signature and a brief description of why you disagree with the decision. I have the paper the pharmacy sent for you to sign and they also provided a box for the brief description. Or you can send me a phone note of what you want to say. I'll give Daneil Dan the paperwork so you can sign it.

## 2017-03-30 NOTE — Telephone Encounter (Signed)
Please let them know  That he has poorly controlled asthma despite compliance with inhalers and Nucala. And that nucala and xolair act diffent mechanisms. He has high IgE (254 on 08/16/16) and we are trying to control his asthma by add-on treatment of a different mechanism which is reducing IgE burden via xolair while Nucala only works against eosinophils   Dr. Brand Males, M.D., F.C.C.P Pulmonary and Critical Care Medicine Staff Physician [place] [facility] Pager: [phone], If no answer or between  15:00h - 7:00h: call [phone]  03/30/2017 10:54 AM

## 2017-03-30 NOTE — Telephone Encounter (Signed)
Seth S.  Please see previous message from MR

## 2017-03-30 NOTE — Telephone Encounter (Signed)
Yes Sir, I printed your letter and I'm going to fax it now.  Thank you, for the letter. I hope they will approve the appeal, so we can get him started on xolair and hopefully with the combination of the two  Seth Kerr will feel better.

## 2017-04-04 ENCOUNTER — Telehealth: Payer: Self-pay | Admitting: Internal Medicine

## 2017-04-04 DIAGNOSIS — J4540 Moderate persistent asthma, uncomplicated: Secondary | ICD-10-CM | POA: Diagnosis not present

## 2017-04-04 NOTE — Telephone Encounter (Signed)
#   vials:1 Ordered date:04/04/17 Shipping Date:04/06/15

## 2017-04-10 NOTE — Telephone Encounter (Signed)
Arrival Date: 04/06/17 received by [facility], recorded by TBS( I was off) # of Vials: 1 Lot #: [id number] Expiration Date: 8/20

## 2017-04-10 NOTE — Telephone Encounter (Signed)
Still waiting on appeal, it will take some time. Closing this note, will create an another note once we hear from appeal. Nothing further needed at this time.

## 2017-04-11 ENCOUNTER — Ambulatory Visit (INDEPENDENT_AMBULATORY_CARE_PROVIDER_SITE_OTHER): Payer: 59

## 2017-04-11 DIAGNOSIS — J4541 Moderate persistent asthma with (acute) exacerbation: Secondary | ICD-10-CM | POA: Diagnosis not present

## 2017-04-12 ENCOUNTER — Telehealth: Payer: Self-pay | Admitting: Internal Medicine

## 2017-04-12 MED ORDER — MEPOLIZUMAB 100 MG ~~LOC~~ SOLR
100.0000 mg | SUBCUTANEOUS | Status: DC
  Administered 2017-04-11: 100 mg via SUBCUTANEOUS

## 2017-04-12 NOTE — Telephone Encounter (Signed)
Let Kirin Pastorino know that if adding Arvid Right will not be approved by insurance we can try to change Nucala to [place] which is simiarl to nucala but acts slightly differently to see if that would work better  Keep me posted  Dr. Brand Males, M.D., [facility].C.P Pulmonary and Critical Care Medicine Staff Physician [place] [facility] Pager: [phone], If no answer or between  15:00h - 7:00h: call [phone]  04/12/2017 10:48 AM

## 2017-04-12 NOTE — Progress Notes (Signed)
Nucala injection documentation and charges entered by Ashley Caulfield, RMA, based on injection sheet filled out by Tammy Scott per office protocol.   

## 2017-04-16 ENCOUNTER — Other Ambulatory Visit: Payer: Self-pay | Admitting: Internal Medicine

## 2017-04-17 ENCOUNTER — Telehealth: Payer: Self-pay | Admitting: Internal Medicine

## 2017-04-17 NOTE — Telephone Encounter (Signed)
Called and initiated p/a. This rep. Was backwards from how it's usually done.  Either [facility]  Will call or a pharmacy? I'll  Give it a few days and call back if I don't hear anything.

## 2017-04-17 NOTE — Telephone Encounter (Signed)
TS please advise if the xolair PA has been intiated. Thanks

## 2017-04-17 NOTE — Telephone Encounter (Signed)
This is for Xolair.

## 2017-04-20 NOTE — Telephone Encounter (Signed)
Called and spoke to pt. Informed him of the recs per MR. Pt verbalized understanding and denied any further questions or concerns at this time.   Tammy S please advise where we are with pt's Xolair. Thanks.

## 2017-04-23 NOTE — Telephone Encounter (Signed)
Closing this note because, this note has become a duplicate.

## 2017-04-23 NOTE — Telephone Encounter (Signed)
I just received a fax from [facility] today. I sent the info. They requested . When I last talked to someone at [facility] they gave me a p/a # and dates but did say they would have to be confirmed. Hopefully we will receive the confirmation soon.

## 2017-04-23 NOTE — Telephone Encounter (Signed)
I just got a fax this morning from [facility] sent needed info., waiting for confirmation of P/A. I have a p/a # and dates they just need to be confirmed per pharmacist from [facility] .

## 2017-05-02 NOTE — Telephone Encounter (Signed)
Seth Kerr, please give updates on this pt with Xolair. Thanks.

## 2017-05-03 NOTE — Telephone Encounter (Addendum)
Called pt. 05/02/17, (I called pt.around 5:20, I had to get to the staff meeing, Lanny Hurst was rounding [place] up. I didn't get a chance to document.) lm with voice mail. Xolair has been approved, I hadn't heard from pt. So I called him back. (today) He got a message about co-pay assistance, he's going to call about that tomorrow. They should call me, but he said he'd call me back and let me know what is going on. Will wait for pt.'s call.

## 2017-05-09 ENCOUNTER — Telehealth: Payer: Self-pay | Admitting: *Deleted

## 2017-05-09 NOTE — Telephone Encounter (Signed)
Called to order nucala, [facility] d/c'd rx because RM also wants pt. To receive xolair also. Gave a verbal rx for nucala, will call Fri. or Mon. To confirm del.. I was given a del. Of 05/16/17.

## 2017-05-15 DIAGNOSIS — J4540 Moderate persistent asthma, uncomplicated: Secondary | ICD-10-CM | POA: Diagnosis not present

## 2017-05-15 NOTE — Telephone Encounter (Signed)
Apparently the order wasn't put in. A rep. Called today to set up del.. She said she would be able to get it here tomorrow.  Vials: 1 Order Date: 05/15/17 Ship Date: 05/15/17

## 2017-05-16 NOTE — Telephone Encounter (Signed)
Arrival Date: 05/16/17 # of Vials: 1 Lot #: [id number] Expiration Date: 8/20

## 2017-05-16 NOTE — Telephone Encounter (Signed)
If I remember correctly, Seth Kerr was out of town last wk.. I called him today since I haven't heard from him or [facility]. I lmom telling him his Arvid Right was approved, he just needs to call [facility]. I gave him their # in case he couldn't find it. I will wait for one of them to call me.

## 2017-05-18 ENCOUNTER — Ambulatory Visit (INDEPENDENT_AMBULATORY_CARE_PROVIDER_SITE_OTHER): Payer: 59

## 2017-05-18 ENCOUNTER — Telehealth: Payer: Self-pay | Admitting: Internal Medicine

## 2017-05-18 DIAGNOSIS — J4550 Severe persistent asthma, uncomplicated: Secondary | ICD-10-CM | POA: Diagnosis not present

## 2017-05-18 NOTE — Telephone Encounter (Addendum)
#   vials:4 Ordered date:05/18/17 Shipping Date:05/24/17 Waiting for pt. To give pharm permission to ship. Pharm will call to set up ship, once they get pt's consent.

## 2017-05-18 NOTE — Telephone Encounter (Signed)
Seth Kerr came in for his nucala shot today and told me he did talk with a rep. [facility]. They gave him his estimated cost and he gave permission to ship his med.. I have put the order in xolair smart text. Nothing further needed at this time. Closing.

## 2017-05-19 DIAGNOSIS — J4540 Moderate persistent asthma, uncomplicated: Secondary | ICD-10-CM | POA: Diagnosis not present

## 2017-05-21 ENCOUNTER — Encounter: Payer: 59 | Admitting: Family Medicine

## 2017-05-22 MED ORDER — MEPOLIZUMAB 100 MG ~~LOC~~ SOLR
100.0000 mg | Freq: Once | SUBCUTANEOUS | Status: AC
  Administered 2017-05-18: 100 mg via SUBCUTANEOUS

## 2017-05-25 ENCOUNTER — Telehealth: Payer: Self-pay | Admitting: Internal Medicine

## 2017-05-25 NOTE — Telephone Encounter (Signed)
#   Vials:4 Arrival Date:05/25/17 Lot #:[id number] Exp Date:11/21

## 2017-05-25 NOTE — Telephone Encounter (Addendum)
Called pt. To let him know his Arvid Right is here. I asked him to call me back next wk. To set up an appt. For his 1st xolair shot. Will wait for pt. To call.

## 2017-05-29 NOTE — Telephone Encounter (Signed)
Called pt. To let him know we have called both drug co. And have not gotten an official answer yet. We will let him know as soon as we do. I called him back to let him know, I counted the time between his nucala shot ( this week is 3 wks). If we get the answer in the next day or so hopefully he'll be able to come in this wk.. Waiting for e-mail.

## 2017-05-31 NOTE — Telephone Encounter (Signed)
MR is waiting for Dr. Laurance Flatten [facility]) to call him back. She currently has dual therapy pts.. Once he lets [place] know how far apart he wants the shots or give them at the same  time. We will call the pt. To schedule an appt.Marland Kitchen

## 2017-06-01 NOTE — Telephone Encounter (Addendum)
Called Seth Kerr to let him know we hadn't forgot about him. I told him we did find out a Dr. At [facility] had started doing some dual therapy. He said he and wife did some research and found that Dr. Soledad Gerlach was doing dual therapy. Seth Kerr has a call in to Dr.Moore as of Wed. Afternoon late he had not heard from her. I did let him know Seth Kerr is on vacation next wk.. But I feel like if she contacts him he'll get back with [place]. We will not be starting his xolair until we hear from Seth Kerr. Will leave encounter open for now. I just found out from his nurse he won't be able to get back with [place]. He is on vacation. (He may be out of the country)

## 2017-06-07 ENCOUNTER — Telehealth: Payer: Self-pay | Admitting: Internal Medicine

## 2017-06-07 DIAGNOSIS — J4540 Moderate persistent asthma, uncomplicated: Secondary | ICD-10-CM | POA: Diagnosis not present

## 2017-06-07 NOTE — Telephone Encounter (Signed)
Vials: 1 Order Date: 06/07/17 Ship Date: 06/12/17

## 2017-06-12 ENCOUNTER — Telehealth: Payer: Self-pay | Admitting: Internal Medicine

## 2017-06-12 NOTE — Telephone Encounter (Signed)
Got message via email from Dr Soledad Gerlach at [facility] - he er first rec is to switch to [facility] and then reassess if there is true need for dual biologics at same time . This is her favored approach due to uncertainty with dual approach (I have explained to patient about the uncertainties of dual approach). I called patient to explain this but LMTCB and ask for [facility].   My current preference is to rotate out of nucala to xolair and reassess  Dr. Brand Males, M.D., [facility].C.P Pulmonary and Critical Care Medicine Staff Physician [place] [facility] Pager: [phone], If no answer or between  15:00h - 7:00h: call [phone]  06/12/2017 8:53 AM

## 2017-06-12 NOTE — Telephone Encounter (Signed)
Patient returning call - he can be reached at [phone] -pr

## 2017-06-13 ENCOUNTER — Other Ambulatory Visit: Payer: Self-pay | Admitting: Internal Medicine

## 2017-06-13 NOTE — Telephone Encounter (Signed)
Rec'd Date: 06/13/2017 Vial: 1 EXP: 06/2019 [place]

## 2017-06-14 ENCOUNTER — Other Ambulatory Visit: Payer: Self-pay | Admitting: Internal Medicine

## 2017-06-18 NOTE — Telephone Encounter (Signed)
In this case I am happy for him stay on nucala without resorting to add on xolair or rotating to [employer]. If he still needs to talk to me please send message back and I can call while I am on hospital rotation. Regardless, please ensuer fu next 3 months sometime  Dr. Brand Males, M.D., [facility].C.P Pulmonary and Critical Care Medicine Staff Physician [place] [facility] Pager: [phone], If no answer or between  15:00h - 7:00h: call [phone]  06/18/2017 11:41 PM

## 2017-06-18 NOTE — Telephone Encounter (Signed)
Spoke with patient-states he has been able to see a difference with being on Nucala-takes his daily meds and not having to use Rescue inhaler now. Pt has been able to go outside and run and starting to feel better-thinks Nucala could have taken longer than usual to really "kick in" to his body. Pt is a little leery of stopping Nucala at this time. Would like to go over concerns,etc with MR directly.    MR please advise if you will contact patient directly or prefer to have patient come to the office for a face to face appt. Thanks.

## 2017-06-19 NOTE — Telephone Encounter (Signed)
Pt has appt scheduled 06/26/17 and states that he will discuss further at this visit. Nothing further needed.

## 2017-06-20 ENCOUNTER — Ambulatory Visit (INDEPENDENT_AMBULATORY_CARE_PROVIDER_SITE_OTHER): Payer: 59

## 2017-06-20 ENCOUNTER — Encounter: Payer: Self-pay | Admitting: Internal Medicine

## 2017-06-20 DIAGNOSIS — D721 Eosinophilia, unspecified: Secondary | ICD-10-CM

## 2017-06-21 MED ORDER — MEPOLIZUMAB 100 MG ~~LOC~~ SOLR
100.0000 mg | SUBCUTANEOUS | Status: DC
  Administered 2017-06-20: 100 mg via SUBCUTANEOUS

## 2017-06-21 NOTE — Progress Notes (Signed)
Documentation of medication administration and charges of Nucala have been completed by Lindsay Lemons, CMA based on the Nucala documentation sheet completed by Tammy Scott.   

## 2017-06-26 ENCOUNTER — Encounter: Payer: Self-pay | Admitting: Internal Medicine

## 2017-06-26 ENCOUNTER — Ambulatory Visit (INDEPENDENT_AMBULATORY_CARE_PROVIDER_SITE_OTHER): Payer: 59 | Admitting: Internal Medicine

## 2017-06-26 VITALS — BP 102/70 | HR 64 | Ht 71.0 in | Wt 198.0 lb

## 2017-06-26 DIAGNOSIS — D721 Eosinophilia, unspecified: Secondary | ICD-10-CM

## 2017-06-26 DIAGNOSIS — J4540 Moderate persistent asthma, uncomplicated: Secondary | ICD-10-CM | POA: Diagnosis not present

## 2017-06-26 DIAGNOSIS — R768 Other specified abnormal immunological findings in serum: Secondary | ICD-10-CM | POA: Diagnosis not present

## 2017-06-26 LAB — NITRIC OXIDE: NITRIC OXIDE: 85

## 2017-06-26 NOTE — Addendum Note (Signed)
Addended by: Collier Salina on: 06/26/2017 11:40 AM   Modules accepted: Orders

## 2017-06-26 NOTE — Patient Instructions (Signed)
ICD-10-CM   1. Very poorly controlled moderate persistent asthma J45.40   2. Elevated IgE level R76.8   3. 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

## 2017-06-26 NOTE — Progress Notes (Signed)
Subjective:     Patient ID: Seth Kerr, male   DOB: 12-Nov-1971, 46 y.o.   MRN: [id number]  HPI   OV 06/26/2017  Chief Complaint  Patient presents with  . Follow-up    Pt states he is not on Xolair yet. Pt states he feels he is doing well. Pt states he only uses his albuterol hfa once a month. Pt denies SOB, cough, CP/tightness, f/c/s.    Follow-up allergy poorly controlled asthma with high eosinophils and high IgE  He is on Spiriva, inhaled corticosteroids and long-acting beta agonist and Singulair. He is also on interleukin-5 receptor antibody nucala. Despite this treatment he has had poorly controlled asthma despite the reported compliance. We then decided to introduce Xolair as an add-on therapy. We did have discussions with him about the unknown safety and efficacy data about doing combination. I did talk to 1 national experts or regional experts. I then we'll change the plan to rotate him to Xolair and stop the interleukin-5 receptor antibody. All this has taken some months. In the interim his asthma is continued to do better. He likes the interleukin-5 receptor antibody and therefore he does not want to stop it currently feels his asthma is well controlled. His last prednisone was in April 2018. He currently is working out in [employer] and able to swim and run. His daughter is on Xolair therapy which works well for her.  Of note his exhaled nitric oxide continues to be high and it is unclear why  Results for Seth, Kerr (MRN [id number]) as of 06/26/2017 09:10  Ref. Range 06/12/2016 16:09 07/27/2016 16:55 08/23/2016 16:14 09/19/2016 09:30 12/25/2016 09:00 06/26/2017   Nitric Oxide Unknown 26 87 80 56 84 84       has a past medical history of Allergic rhinitis, cause unspecified; Asthma; Erectile dysfunction; Irritable bowel syndrome (IBS); and Onychomycosis.   reports that he has never smoked. He has never used smokeless tobacco.  Past Surgical History:  Procedure Laterality Date  .  APPENDECTOMY  age 1  . SEPTOPLASTY  approx age 4  . SHOULDER SURGERY Right     Allergies  Allergen Reactions  . Aspirin Other (See Comments)    Not actual allergy, had chronic nosebleeds as a child and was instructed not to use aspirin.    Immunization History  Administered Date(s) Administered  . Influenza Split 09/12/2012, 08/29/2014, 09/05/2016  . Influenza,inj,Quad PF,36+ Mos 10/09/2013, 09/03/2015  . Pneumococcal Polysaccharide-23 05/05/2015  . Tdap 10/04/2010, 04/22/2014    Family History  Problem Relation Age of Onset  . Cancer Mother        lung cancer, smoker  . Alcohol abuse Mother   . Cancer Father        stomach cancer, smoker  . Alcohol abuse Father   . Asthma Daughter   . Diabetes Neg Hx   . Heart disease Neg Hx      Current Outpatient Prescriptions:  .  albuterol (PROVENTIL) (2.5 MG/3ML) 0.083% nebulizer solution, Take 3 mLs (2.5 mg total) by nebulization every 6 (six) hours as needed for wheezing or shortness of breath., Disp: 75 mL, Rfl: 3 .  EPIPEN 2-PAK 0.3 MG/0.3ML SOAJ injection, Use as directed, Disp: 1 Device, Rfl: 1 .  fluticasone furoate-vilanterol (BREO ELLIPTA) 200-25 MCG/INH AEPB, Inhale 1 puff into the lungs daily., Disp: , Rfl:  .  levocetirizine (XYZAL) 5 MG tablet, Take 5 mg by mouth every evening., Disp: , Rfl:  .  mometasone (NASONEX) 50 MCG/ACT nasal spray,  Place 2 sprays into the nose 2 (two) times daily. Reported on 05/17/2016, Disp: 17 g, Rfl: 4 .  montelukast (SINGULAIR) 10 MG tablet, Take 10 mg by mouth. Reported on 03/15/2016, Disp: , Rfl:  .  PROAIR RESPICLICK 175 (90 Base) MCG/ACT AEPB, Inhale 2 puffs into the lungs every 4 (four) hours as needed., Disp: 1 each, Rfl: 3 .  sildenafil (VIAGRA) 100 MG tablet, Take 0.5-1 tablets (50-100 mg total) by mouth daily as needed for erectile dysfunction., Disp: 5 tablet, Rfl: 11 .  SPIRIVA RESPIMAT 2.5 MCG/ACT AERS, INHALE 2 PUFFS INTO THE LUNGS DAILY., Disp: 1 Inhaler, Rfl: 5  Current  Facility-Administered Medications:  Marland Kitchen  Mepolizumab SOLR 100 mg, 100 mg, Subcutaneous, Q28 days, Brand Males, MD, 100 mg at 06/20/17 0831    Review of Systems     Objective:   Physical Exam  Constitutional: He is oriented to person, place, and time. He appears well-developed and well-nourished. No distress.  Obese, sitting in wheel chair, talkative today, nasal cannula on  HENT:  Head: Normocephalic and atraumatic.  Right Ear: External ear normal.  Left Ear: External ear normal.  Mouth/Throat: Oropharynx is clear and moist. No oropharyngeal exudate.  Eyes: Pupils are equal, round, and reactive to light. Conjunctivae and EOM are normal. Right eye exhibits no discharge. Left eye exhibits no discharge. No scleral icterus.  Neck: Normal range of motion. Neck supple. No JVD present. No tracheal deviation present. No thyromegaly present.  Cardiovascular: Normal rate, regular rhythm and intact distal pulses.  Exam reveals no gallop and no friction rub.   No murmur heard. Pulmonary/Chest: Effort normal and breath sounds normal. No respiratory distress. He has no wheezes. He has no rales. He exhibits no tenderness.  Abdominal: Soft. Bowel sounds are normal. He exhibits no distension and no mass. There is no tenderness. There is no rebound and no guarding.  Musculoskeletal: Normal range of motion. He exhibits edema. He exhibits no tenderness.  ++ edema  Lymphadenopathy:    He has no cervical adenopathy.  Neurological: He is alert and oriented to person, place, and time. He has normal reflexes. No cranial nerve deficit. Coordination normal.  Skin: Skin is warm and dry. No rash noted. He is not diaphoretic. No erythema. No pallor.  Psychiatric: He has a normal mood and affect. His behavior is normal. Judgment and thought content normal.  Nursing note and vitals reviewed.  Vitals:   06/26/17 0903  BP: 102/70  Pulse: 64    Body mass index is 27.62 kg/m. Vitals:   06/26/17 0903  BP:  102/70  Pulse: 64  SpO2: 97%  Weight: 198 lb (89.8 kg)  Height: 5\' 11"  (1.803 m)       Assessment:       ICD-10-CM   1. Very poorly controlled moderate persistent asthma J45.40   2. Elevated IgE level R76.8   3. 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 Brand Males, M.D., [facility].C.P Pulmonary and Critical Care Medicine Staff Physician [place] [facility] Pager: [phone], If no answer or between  15:00h - 7:00h: call [phone]  06/26/2017 9:22 AM

## 2017-07-12 ENCOUNTER — Telehealth: Payer: Self-pay | Admitting: Internal Medicine

## 2017-07-12 NOTE — Telephone Encounter (Signed)
I called to order Mr. Seth Kerr, the rep was hard to understand, she said something about his co-pay. She did say that they would be calling us back ([name]) to set up a del. Date. She said it may take up to 2 days. Will wait for call or fax.

## 2017-07-19 DIAGNOSIS — J4540 Moderate persistent asthma, uncomplicated: Secondary | ICD-10-CM | POA: Diagnosis not present

## 2017-07-19 NOTE — Telephone Encounter (Signed)
#   vials:1 Ordered date:07/19/17 Shipping Date:07/19/17

## 2017-07-20 NOTE — Telephone Encounter (Signed)
Arrival Date: 07/20/17 # of Vials: 1 Lot #: [id number]  Expiration Date: 07/2019

## 2017-07-24 ENCOUNTER — Ambulatory Visit: Payer: 59

## 2017-07-24 ENCOUNTER — Ambulatory Visit (INDEPENDENT_AMBULATORY_CARE_PROVIDER_SITE_OTHER): Payer: 59

## 2017-07-24 ENCOUNTER — Other Ambulatory Visit: Payer: Self-pay | Admitting: Internal Medicine

## 2017-07-24 DIAGNOSIS — D721 Eosinophilia, unspecified: Secondary | ICD-10-CM

## 2017-07-25 MED ORDER — MEPOLIZUMAB 100 MG ~~LOC~~ SOLR
100.0000 mg | SUBCUTANEOUS | Status: DC
  Administered 2017-07-24: 100 mg via SUBCUTANEOUS

## 2017-07-25 NOTE — Progress Notes (Signed)
Documentation of medication administration and charges of Nucala have been completed by Mckenzi Buonomo, CMA based on the Nucala documentation sheet completed by Tammy Scott.   

## 2017-07-31 ENCOUNTER — Telehealth: Payer: Self-pay | Admitting: Internal Medicine

## 2017-07-31 NOTE — Telephone Encounter (Signed)
Vials: 1 Order Date: 07/31/17 Ship Date: 08/15/17

## 2017-08-11 DIAGNOSIS — J4540 Moderate persistent asthma, uncomplicated: Secondary | ICD-10-CM | POA: Diagnosis not present

## 2017-08-14 ENCOUNTER — Other Ambulatory Visit: Payer: Self-pay | Admitting: Internal Medicine

## 2017-08-15 ENCOUNTER — Telehealth: Payer: Self-pay | Admitting: Internal Medicine

## 2017-08-15 MED ORDER — FLUTICASONE FUROATE-VILANTEROL 200-25 MCG/INH IN AEPB: 1.0000 | INHALATION_SPRAY | Freq: Every day | RESPIRATORY_TRACT | 4 refills | Status: DC

## 2017-08-15 MED ORDER — ALBUTEROL SULFATE (2.5 MG/3ML) 0.083% IN NEBU: 2.5000 mg | INHALATION_SOLUTION | Freq: Four times a day (QID) | RESPIRATORY_TRACT | 3 refills | Status: DC | PRN

## 2017-08-15 NOTE — Telephone Encounter (Signed)
Spoke with patient to verify medications and pharmacy. Will go ahead and sent in the RXs. He verbalized understanding.

## 2017-08-16 NOTE — Telephone Encounter (Signed)
#   Vials:1 Arrival Date:08/16/17 Lot #:[id number] Exp Date:07/2019

## 2017-08-24 ENCOUNTER — Ambulatory Visit (INDEPENDENT_AMBULATORY_CARE_PROVIDER_SITE_OTHER): Payer: 59

## 2017-08-24 DIAGNOSIS — D721 Eosinophilia, unspecified: Secondary | ICD-10-CM

## 2017-08-27 MED ORDER — MEPOLIZUMAB 100 MG ~~LOC~~ SOLR
100.0000 mg | SUBCUTANEOUS | Status: DC
  Administered 2017-08-24: 100 mg via SUBCUTANEOUS

## 2017-08-27 NOTE — Progress Notes (Signed)
Documentation of medication administration and charges of Nucala have been completed by Nole Robey, CMA based on the Nucala documentation sheet completed by Tammy Scott.   

## 2017-08-28 ENCOUNTER — Telehealth: Payer: Self-pay | Admitting: Internal Medicine

## 2017-08-28 NOTE — Progress Notes (Signed)
Chief Complaint  Patient presents with  . Annual Exam    fasting (had bar an hour ago) CPE. No concerns.     Seth Kerr is a 46 y.o. male who presents for a complete physical.  He has the following concerns:  Onychomycosis:  He was treated with another course of lamisil last year, due to recurrent toenail discoloration. That was effective, and no recurrence.  Asthma and allergies: Under the care of pulmonary. He has had poorly controlled asthma with high eosinophils and high IgE. He has been doing well on monthly Nucala injections, in addition to his other routine medications.  They had discussed Zolair at one point (which his daughter is doing well on), but asthma improved and adding/rotating that wasn't necessary.  He hasn't needed steroids since April, 2017.  Hasn't used rescue inhaler about a month ago (when shifts changed; change in sleep sometimes triggers).  He has been riding road bike, no problems with exercise.  Erectile dysfunction--previously used Viagra.  No longer having any issues.  Immunization History  Administered Date(s) Administered  . Influenza Split 09/12/2012, 08/29/2014, 09/05/2016  . Influenza,inj,Quad PF,6+ Mos 10/09/2013, 09/03/2015  . Pneumococcal Polysaccharide-23 05/05/2015  . Tdap 10/04/2010, 04/22/2014   Last colonoscopy: done at approx age 51-35, and another one age 80. Reportedly normal. Diagnosed with IBS Last PSA: reports having had one done at age 24 (through work) Pharmacist, community: twice yearly Ophtho: yearly Exercise: 4-5 days/week weights and running or road biking. (12 hrs shifts are 2 weeks days, then switches to 2 weeks nights). Lipids: Lab Results  Component Value Date   CHOL 210 (H) 05/17/2016   HDL 102 05/17/2016   LDLCALC 98 05/17/2016   TRIG 50 05/17/2016   CHOLHDL 2.1 05/17/2016   Vitamin D and thyroid screens done 04/2014, normal (D was 45)   Past Medical History:  Diagnosis Date  . Allergic rhinitis, cause unspecified    immunotherapy x 56yrs (stopped end of 2015), per Dr. Donneta Romberg  . Asthma   . Erectile dysfunction   . Irritable bowel syndrome (IBS)    has had 2 colonoscopies  . Onychomycosis    s/p lamisil x 2 courses    Past Surgical History:  Procedure Laterality Date  . SEPTOPLASTY  approx age 54  . SHOULDER SURGERY Right   . TONSILLECTOMY  age 65    Social History   Social History  . Marital status: Married    Spouse name: N/A  . Number of children: 3  . Years of education: N/A   Occupational History  . 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 Smoking status: Never Smoker  . Smokeless tobacco: Never Used  . Alcohol use Yes     Comment: 4-5 drinks//week (no more than 2 drinks in a night)  . Drug use: No  . Sexual activity: Yes    Partners: Female   Other Topics Concern  . Not on file   Social History Narrative   Lives with wife, 3 kids, 3 dogs.  He is a runner; started road biking (plans to try Ironman).  Moved here from [facility].  Curator, works 12 hour shifts, on a rotating schedule.     Family History  Problem Relation Age of Onset  . Cancer Mother        lung cancer, smoker  . Alcohol abuse Mother   . Cancer Father        stomach cancer, smoker  .  Alcohol abuse Father   . Asthma Daughter   . Diabetes Neg Hx   . Heart disease Neg Hx     Outpatient Encounter Prescriptions as of 08/29/2017  Medication Sig Note  . fluticasone furoate-vilanterol (BREO ELLIPTA) 200-25 MCG/INH AEPB Inhale 1 puff into the lungs daily.   Marland Kitchen levocetirizine (XYZAL) 5 MG tablet Take 5 mg by mouth every evening.   . montelukast (SINGULAIR) 10 MG tablet Take 10 mg by mouth. Reported on 03/15/2016 05/05/2015: Received from: [facility]  . SPIRIVA RESPIMAT 2.5 MCG/ACT AERS INHALE 2 PUFFS INTO THE LUNGS DAILY.   Marland Kitchen albuterol (PROVENTIL) (2.5 MG/3ML) 0.083% nebulizer solution Take 3 mLs (2.5 mg total) by nebulization every 6 (six)  hours as needed for wheezing or shortness of breath. (Patient not taking: Reported on 08/29/2017)   . EPIPEN 2-PAK 0.3 MG/0.3ML SOAJ injection Use as directed (Patient not taking: Reported on 08/29/2017)   . mometasone (NASONEX) 50 MCG/ACT nasal spray Place 2 sprays into the nose 2 (two) times daily. Reported on 05/17/2016 (Patient not taking: Reported on 08/29/2017) 08/29/2017: Uses nasal steroid seasonally, usually OTC kind  . PROAIR RESPICLICK 161 (90 Base) MCG/ACT AEPB Inhale 2 puffs into the lungs every 4 (four) hours as needed. (Patient not taking: Reported on 08/29/2017)   . [DISCONTINUED] sildenafil (VIAGRA) 100 MG tablet Take 0.5-1 tablets (50-100 mg total) by mouth daily as needed for erectile dysfunction. (Patient not taking: Reported on 08/29/2017)    Facility-Administered Encounter Medications as of 08/29/2017  Medication  . Mepolizumab SOLR 100 mg  . Mepolizumab SOLR 100 mg  . Mepolizumab SOLR 100 mg    Allergies  Allergen Reactions  . Aspirin Other (See Comments)    Not actual allergy, had chronic nosebleeds as a child and was instructed not to use aspirin.    ROS: The patient denies anorexia, fever, headaches,vision loss, decreased hearing, ear pain, hoarseness, palpitations, dizziness, syncope, dyspnea on exertion, cough, swelling, nausea, vomiting, diarrhea, constipation, abdominal pain, melena, hematochezia, indigestion/heartburn, hematuria, incontinence, nocturia, dysuria, genital lesions, numbness, tingling, weakness, tremor, suspicious skin lesions, depression, anxiety, abnormal bleeding/bruising, or enlarged lymph nodes No further problems with ED. Asthma/allergies are controlled. 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   Some soreness on his left body from a recent fall on the bike. No further problems with toenail discoloration (s/p second course of lamisil).   +intentional weight  loss   PHYSICAL EXAM:  BP 110/78 (BP Location: Left Arm, Patient Position: Sitting, Cuff Size: Normal)   Pulse 60   Ht 5\' 10"  (1.778 m)   Wt 194 lb 9.6 oz (88.3 kg)   BMI 27.92 kg/m   Wt Readings from Last 3 Encounters:  08/29/17 194 lb 9.6 oz (88.3 kg)  06/26/17 198 lb (89.8 kg)  03/05/17 199 lb 6.4 oz (90.4 kg)    General Appearance:   Alert, cooperative, no distress, appears stated age  Head:   Normocephalic, without obvious abnormality, atraumatic  Eyes:   PERRL, conjunctiva/corneas clear, EOM's intact, fundi benign  Ears:   Normal TM's and external ear canals  Nose:  Nares normal, mucosa is mod edematous with clear drainage bilaterally. Sinuses nontender  Throat:  Lips, mucosa, and tongue normal; teeth and gums normal. OP is clear.  Neck:  Supple, no lymphadenopathy; thyroid: noenlargement/tenderness/ nodules; no carotid bruit or JVD  Back:  Spine nontender, no curvature, ROM normal, no CVA tenderness  Lungs:  Respirations unlabored, good air movement. No wheezing,  rales, ronchi  Chest Wall:   No tenderness or deformity  Heart:   Regular rate and rhythm, S1 and S2 normal, no murmur, rub or gallop  Breast Exam:   No chest wall tenderness, masses or gynecomastia  Abdomen:   Soft, non-tender, nondistended, normoactive bowel sounds, no masses, no hepatosplenomegaly  Genitalia:   Normal male external genitalia without lesions. Testicles without masses. No inguinal hernias.  Rectal:   Normal sphincter tone, no masses or tenderness; guaiac negative stool. Prostate smooth, no nodules, not enlarged.  Extremities:  No clubbing, cyanosis or edema.   Pulses:  2+ and symmetric all extremities  Skin:  Skin color, texture, turgor normal, no rashes or lesions.Toenails are normal  Lymph nodes:  Cervical, supraclavicular, and axillary nodes normal  Neurologic:  CNII-XII intact, normal  strength, sensation and gait; reflexes 2+ and symmetric throughout   Psych: Normal mood, affect, hygiene and grooming    ASSESSMENT/PLAN:  Annual physical exam - Plan: POCT Urinalysis DIP (Proadvantage Device), Visual acuity screening  Moderate persistent asthma, unspecified whether complicated - stable on current regimen per pulm  Allergic rhinitis, unspecified seasonality, unspecified trigger - flaring now per exam--encouraged him to restart nasal steroid spray  Need for influenza vaccination - Plan: Flu Vaccine QUAD 6+ mos PF IM (Fluarix Quad PF)   Discussed PSA screening (risks/benefits)--start at age 65; recommended at least 30 minutes of aerobic activity at least 5 days/week, weight-bearing exercise at least 2x/week; proper sunscreen use reviewed; healthy diet and alcohol recommendations (less than or equal to 2 drinks/day) reviewed; regular seatbelt use; changing batteries in smoke detectors. Self-testicular exams. Immunization recommendations discussed--continue yearly flu shots. Consider Prevnar at age 30. Colonoscopy recommendations reviewed--due again age 6 (sooner prn problems).  F/u 1 year for CPE, sooner prn.

## 2017-08-28 NOTE — Telephone Encounter (Signed)
Called [employer] and gave a verbal p/a. I will send ov notes as requested from rep. At [facility]. Reviewing process will take up to 15 days. The rep. Actually said less than 15 days. Waiting.

## 2017-08-29 ENCOUNTER — Encounter: Payer: Self-pay | Admitting: Family Medicine

## 2017-08-29 ENCOUNTER — Ambulatory Visit (INDEPENDENT_AMBULATORY_CARE_PROVIDER_SITE_OTHER): Payer: 59 | Admitting: Family Medicine

## 2017-08-29 VITALS — BP 110/78 | HR 60 | Ht 70.0 in | Wt 194.6 lb

## 2017-08-29 DIAGNOSIS — J309 Allergic rhinitis, unspecified: Secondary | ICD-10-CM

## 2017-08-29 DIAGNOSIS — Z0000 Encounter for general adult medical examination without abnormal findings: Secondary | ICD-10-CM | POA: Diagnosis not present

## 2017-08-29 DIAGNOSIS — Z23 Encounter for immunization: Secondary | ICD-10-CM

## 2017-08-29 DIAGNOSIS — J4540 Moderate persistent asthma, uncomplicated: Secondary | ICD-10-CM | POA: Diagnosis not present

## 2017-08-29 LAB — POCT URINALYSIS DIP (PROADVANTAGE DEVICE)
Bilirubin, UA: NEGATIVE
Blood, UA: NEGATIVE
GLUCOSE UA: NEGATIVE mg/dL
Ketones, POC UA: NEGATIVE mg/dL
Leukocytes, UA: NEGATIVE
Nitrite, UA: NEGATIVE
PROTEIN UA: NEGATIVE mg/dL
Specific Gravity, Urine: 1.01
UUROB: NEGATIVE
pH, UA: 6.5 (ref 5.0–8.0)

## 2017-08-29 NOTE — Patient Instructions (Signed)
  HEALTH MAINTENANCE RECOMMENDATIONS:  It is recommended that you get at least 30 minutes of aerobic exercise at least 5 days/week (for weight loss, you may need as much as 60-90 minutes). This can be any activity that gets your heart rate up. This can be divided in 10-15 minute intervals if needed, but try and build up your endurance at least once a week.  Weight bearing exercise is also recommended twice weekly.  Eat a healthy diet with lots of vegetables, fruits and fiber.  "Colorful" foods have a lot of vitamins (ie green vegetables, tomatoes, red peppers, etc).  Limit sweet tea, regular sodas and alcoholic beverages, all of which has a lot of calories and sugar.  Up to 2 alcoholic drinks daily may be beneficial for men (unless trying to lose weight, watch sugars).  Drink a lot of water.  Sunscreen of at least SPF 30 should be used on all sun-exposed parts of the skin when outside between the hours of 10 am and 4 pm (not just when at beach or pool, but even with exercise, golf, tennis, and yard work!)  Use a sunscreen that says "broad spectrum" so it covers both UVA and UVB rays, and make sure to reapply every 1-2 hours.  Remember to change the batteries in your smoke detectors when changing your clock times in the spring and fall.  Use your seat belt every time you are in a car, and please drive safely and not be distracted with cell phones and texting while driving.  Your nasal exam showed significant congestion--I recommend restarting a daily nasal steroid.

## 2017-08-30 NOTE — Telephone Encounter (Signed)
Ref.# for U/P:[id number] Dates:09/27/17- 12/26/17, it is currently under review. I got the summary of benefits today. He does have co-pay assistance. The [employer] rep. I spoke to today said they will call us to set up shipment before pt.'s appt. 09/27/17. Waiting for call.

## 2017-09-05 NOTE — Telephone Encounter (Deleted)
Still waiting for [employer] to call and set up ship.Seth Kerr

## 2017-09-05 NOTE — Telephone Encounter (Signed)
I forgot to put this in 09/04/17. I spoke with Miya (resolution specialist), she said my next step would be to appeal this decision. I included labs,medications,allergies,and some office notes,. as requested. Along with the letter from [facility]. I asked them to call me if they need anything else. Waiting.

## 2017-09-12 ENCOUNTER — Telehealth: Payer: Self-pay | Admitting: Internal Medicine

## 2017-09-12 NOTE — Telephone Encounter (Signed)
Vials: 1 Order Date: 09/12/17 Ship Date: 09/17/17

## 2017-09-12 NOTE — Telephone Encounter (Addendum)
I called to check the status of the appeal and the rep. That answered transferred me to the ordering dept.. I put the order in nucala smart text. Nothing further needed.    I thought [place] asking for an appeal when the p/a was good, but they insisted that was what I needed to do. (Referring to my 1st statement.)

## 2017-09-13 ENCOUNTER — Telehealth: Payer: Self-pay | Admitting: Internal Medicine

## 2017-09-13 DIAGNOSIS — J4540 Moderate persistent asthma, uncomplicated: Secondary | ICD-10-CM | POA: Diagnosis not present

## 2017-09-13 NOTE — Telephone Encounter (Signed)
Spoke with pt who states [facility] is needing additional refills on Signulair. It does not appear that MR has prescribed this medication previously. Pt states his former allergist prescribed this medication. Pt last seen 87/18 with a pending recall around 12/2017.  MR please advise if okay to send 90 day supply of signulair? Thanks.

## 2017-09-14 MED ORDER — MONTELUKAST SODIUM 10 MG PO TABS: 10.0000 mg | ORAL_TABLET | Freq: Every day | ORAL | 4 refills | Status: DC

## 2017-09-14 NOTE — Telephone Encounter (Signed)
Called and spoke with pt and he is aware of refill that has been sent to the pharmacy.   

## 2017-09-14 NOTE — Telephone Encounter (Signed)
Ok to send 90d montelukast (singulair) with 4 refills  Dr. Brand Males, M.D., [facility].C.P Pulmonary and Critical Care Medicine Staff Physician [facility] Pager: [phone], If no answer or between  15:00h - 7:00h: call [phone]  09/14/2017 11:07 AM

## 2017-09-19 NOTE — Telephone Encounter (Signed)
Arrival Date: 09/19/17 # of Vials: 1 Lot #: [id number] Expiration Date: 08/2019

## 2017-09-27 ENCOUNTER — Ambulatory Visit (INDEPENDENT_AMBULATORY_CARE_PROVIDER_SITE_OTHER): Payer: 59

## 2017-09-27 DIAGNOSIS — D721 Eosinophilia, unspecified: Secondary | ICD-10-CM

## 2017-09-28 MED ORDER — MEPOLIZUMAB 100 MG ~~LOC~~ SOLR
100.0000 mg | SUBCUTANEOUS | Status: DC
  Administered 2017-09-27: 100 mg via SUBCUTANEOUS

## 2017-09-28 NOTE — Progress Notes (Signed)
Documentation of medication administration and charges of Nucala have been completed by Lindsay Lemons, CMA based on the Nucala documentation sheet completed by Tammy Scott.   

## 2017-09-29 DIAGNOSIS — J069 Acute upper respiratory infection, unspecified: Secondary | ICD-10-CM | POA: Diagnosis not present

## 2017-09-30 DIAGNOSIS — J029 Acute pharyngitis, unspecified: Secondary | ICD-10-CM | POA: Diagnosis not present

## 2017-10-16 ENCOUNTER — Telehealth: Payer: Self-pay | Admitting: Internal Medicine

## 2017-10-16 NOTE — Telephone Encounter (Addendum)
Vials: 1 Order Date: 10/26/17 Ship Date: 10/29/17   There's a co-pay issue I will put the order and ship date in when pt. Has taken care of that, then [name] should call me. [name] did not calle to set up shipment. When I called to order 10/25/17, they said they needed a p/a, I initiated it. ( They gave info. to [place] today. It's in today's encounter)  I called and ordered nucala.

## 2017-10-25 ENCOUNTER — Telehealth: Payer: Self-pay | Admitting: Internal Medicine

## 2017-10-26 DIAGNOSIS — D721 Eosinophilia: Secondary | ICD-10-CM | POA: Diagnosis not present

## 2017-10-26 NOTE — Telephone Encounter (Signed)
Thanks! I will call and order med. And put the order in nucala smart text. Nothing further needed.

## 2017-10-26 NOTE — Telephone Encounter (Signed)
Spoke with [employer] and gave dosing and frequency. She approved the medication, FYI TS.   Approval # [id number]   Approved for 1 year

## 2017-10-30 ENCOUNTER — Ambulatory Visit: Payer: 59

## 2017-10-31 NOTE — Telephone Encounter (Signed)
Arrival Date: 10/31/17 # of Vials: 1 Lot #: [id number] Expiration Date: 01/2020  Delivered a day due to weather.

## 2017-11-12 ENCOUNTER — Ambulatory Visit (INDEPENDENT_AMBULATORY_CARE_PROVIDER_SITE_OTHER): Payer: 59

## 2017-11-12 DIAGNOSIS — D721 Eosinophilia, unspecified: Secondary | ICD-10-CM

## 2017-11-14 MED ORDER — MEPOLIZUMAB 100 MG ~~LOC~~ SOLR
100.0000 mg | Freq: Once | SUBCUTANEOUS | Status: AC
  Administered 2017-11-12: 100 mg via SUBCUTANEOUS

## 2017-12-10 ENCOUNTER — Telehealth: Payer: Self-pay | Admitting: Internal Medicine

## 2017-12-10 NOTE — Telephone Encounter (Signed)
Vials: 1 Order Date: 12/11/2017 Rep. Called pt. lmom for him to call the pharm. And give them permission to ship. Will call pt to see if I can reach him and ask him to call pharm.. Ship Date: 12/12/17 Pt. Called pharm and gave permission to ship. I returned a rep.'s call to confirm order and set up a date for del. 12/13/2017.

## 2017-12-11 DIAGNOSIS — D721 Eosinophilia: Secondary | ICD-10-CM | POA: Diagnosis not present

## 2017-12-11 NOTE — Telephone Encounter (Signed)
Routing to [employer] for follow-up.

## 2017-12-11 NOTE — Telephone Encounter (Signed)
Seth Kerr from [place] called to schedule shipment of Nucala - She can be reached at [phone]. -pr

## 2017-12-11 NOTE — Telephone Encounter (Signed)
Called pt. Today to see if he got my message about calling [employer] and giving them permission to ship. Pt. Said he didn't get the message. Pt. Was at the gym he is going to call [employer] when he gets a chance. Once [employer] calls me to set up del. I'll be able to complete the previous note.

## 2017-12-13 NOTE — Telephone Encounter (Signed)
Arrival Date: 12/13/17 # of Vials: 1 Lot #: [id number] Expiration Date: 01/2020

## 2017-12-14 ENCOUNTER — Ambulatory Visit (INDEPENDENT_AMBULATORY_CARE_PROVIDER_SITE_OTHER): Payer: 59

## 2017-12-14 DIAGNOSIS — D721 Eosinophilia, unspecified: Secondary | ICD-10-CM

## 2017-12-17 MED ORDER — MEPOLIZUMAB 100 MG ~~LOC~~ SOLR
100.0000 mg | SUBCUTANEOUS | Status: DC
  Administered 2017-12-14: 100 mg via SUBCUTANEOUS

## 2017-12-17 NOTE — Progress Notes (Signed)
Documentation of medication administration and charges of Nucala have been completed by Lindsay Lemons, CMA based on the Nucala documentation sheet completed by Tammy Scott.   

## 2017-12-25 ENCOUNTER — Encounter: Payer: Self-pay | Admitting: Internal Medicine

## 2017-12-25 ENCOUNTER — Ambulatory Visit (INDEPENDENT_AMBULATORY_CARE_PROVIDER_SITE_OTHER): Payer: 59 | Admitting: Internal Medicine

## 2017-12-25 VITALS — BP 120/84 | HR 54 | Ht 71.0 in | Wt 198.4 lb

## 2017-12-25 DIAGNOSIS — D721 Eosinophilia, unspecified: Secondary | ICD-10-CM

## 2017-12-25 DIAGNOSIS — R768 Other specified abnormal immunological findings in serum: Secondary | ICD-10-CM

## 2017-12-25 DIAGNOSIS — J4541 Moderate persistent asthma with (acute) exacerbation: Secondary | ICD-10-CM

## 2017-12-25 DIAGNOSIS — Z9109 Other allergy status, other than to drugs and biological substances: Secondary | ICD-10-CM | POA: Diagnosis not present

## 2017-12-25 LAB — NITRIC OXIDE: NITRIC OXIDE: 90

## 2017-12-25 MED ORDER — AZITHROMYCIN 250 MG PO TABS: 250.0000 mg | ORAL_TABLET | Freq: Every day | ORAL | 0 refills | Status: DC

## 2017-12-25 MED ORDER — PREDNISONE 10 MG PO TABS: ORAL_TABLET | ORAL | 0 refills | Status: DC

## 2017-12-25 MED ORDER — PROAIR RESPICLICK 108 (90 BASE) MCG/ACT IN AEPB: 2.0000 | INHALATION_SPRAY | RESPIRATORY_TRACT | 5 refills | Status: DC | PRN

## 2017-12-25 NOTE — Progress Notes (Signed)
Subjective:     Patient ID: Seth Kerr, male   DOB: Feb 21, 1971, 47 y.o.   MRN: [id number]  HPI    OV 06/26/2017  Chief Complaint  Patient presents with  . Follow-up    Pt states he is not on Xolair yet. Pt states he feels he is doing well. Pt states he only uses his albuterol hfa once a month. Pt denies SOB, cough, CP/tightness, f/c/s.    Follow-up allergy poorly controlled asthma with high eosinophils and high IgE  He is on Spiriva, inhaled corticosteroids and long-acting beta agonist and Singulair. He is also on interleukin-5 receptor antibody nucala. Despite this treatment he has had poorly controlled asthma despite the reported compliance. We then decided to introduce Xolair as an add-on therapy. We did have discussions with him about the unknown safety and efficacy data about doing combination. I did talk to 1 national experts or regional experts. I then we'll change the plan to rotate him to Xolair and stop the interleukin-5 receptor antibody. All this has taken some months. In the interim his asthma is continued to do better. He likes the interleukin-5 receptor antibody and therefore he does not want to stop it currently feels his asthma is well controlled. His last prednisone was in April 2018. He currently is working out in [employer] and able to swim and run. His daughter is on Xolair therapy which works well for her.  Of note his exhaled nitric oxide continues to be high and it is unclear why   OV 12/25/2017  Chief Complaint  Patient presents with  . Follow-up    Pt states he has been doing good. Nucala injections have been helping. Pt states he has had some flareups and had to use his rescue inhaler twice yesterday, 12/24/17 and did two neb treatments. Pt has complaints of chest tightness and SOB but not much of a cough.    47 year old male with allergic asthma with eosinophilia and elevated IgE.  On Spiriva, Brio, Singulair and interleukin-5 receptor antibody injections monthly  Nucala  Overall he says his health status is improved because of the biologic injections against asthma but he still having frequent breakthrough exacerbations requiring prednisone.  Few months ago we tried to switch him to Xolair but then he wanted to hold off.  At this point in time he is willing to try another biological.  Today he says that he is got increased asthma exacerbation with increased wheezing and shortness of breath and chest tightness.  Asthma control questionnaire is 3 showing that he is waking up a few times at night and when he wakes up he has moderate symptoms.  His activities are slightly limited because of asthma and is short of breath quite a lot and is wheezing a lot of the time and is using albuterol for rescue 1-2 puffs.  He works in [employer] and does 2 weeks of night shifts and 2 weeks of day shifts.  The night shift or harderAnd is wondering if this is because of diurnal variation of asthma    acq 5 point        3   Ref. Range 06/12/2016 16:09 07/27/2016 16:55 08/23/2016 16:14 09/19/2016 09:30 12/25/2016 09:00 06/26/2017  12/25/2017   Nitric Oxide Unknown 26 87 80 56 84 84 90       has a past medical history of Allergic rhinitis, cause unspecified, Asthma, Erectile dysfunction, Irritable bowel syndrome (IBS), and Onychomycosis.   reports that  has never smoked.  he has never used smokeless tobacco.  Past Surgical History:  Procedure Laterality Date  . SEPTOPLASTY  approx age 76  . SHOULDER SURGERY Right   . TONSILLECTOMY  age 79    Allergies  Allergen Reactions  . Aspirin Other (See Comments)    Not actual allergy, had chronic nosebleeds as a child and was instructed not to use aspirin.    Immunization History  Administered Date(s) Administered  . Influenza Split 09/12/2012, 08/29/2014, 09/05/2016  . Influenza,inj,Quad PF,6+ Mos 10/09/2013, 09/03/2015, 08/29/2017  . Pneumococcal Polysaccharide-23 05/05/2015  . Tdap 10/04/2010, 04/22/2014    Family  History  Problem Relation Age of Onset  . Cancer Mother        lung cancer, smoker  . Alcohol abuse Mother   . Cancer Father        stomach cancer, smoker  . Alcohol abuse Father   . Asthma Daughter   . Diabetes Neg Hx   . Heart disease Neg Hx      Current Outpatient Medications:  .  albuterol (PROVENTIL) (2.5 MG/3ML) 0.083% nebulizer solution, Take 3 mLs (2.5 mg total) by nebulization every 6 (six) hours as needed for wheezing or shortness of breath., Disp: 75 mL, Rfl: 3 .  EPIPEN 2-PAK 0.3 MG/0.3ML SOAJ injection, Use as directed, Disp: 1 Device, Rfl: 1 .  fluticasone furoate-vilanterol (BREO ELLIPTA) 200-25 MCG/INH AEPB, Inhale 1 puff into the lungs daily., Disp: 60 each, Rfl: 4 .  levocetirizine (XYZAL) 5 MG tablet, Take 5 mg by mouth every evening., Disp: , Rfl:  .  mometasone (NASONEX) 50 MCG/ACT nasal spray, Place 2 sprays into the nose 2 (two) times daily. Reported on 05/17/2016, Disp: 17 g, Rfl: 4 .  montelukast (SINGULAIR) 10 MG tablet, Take 1 tablet (10 mg total) by mouth at bedtime. Reported on 03/15/2016, Disp: 90 tablet, Rfl: 4 .  PROAIR RESPICLICK 563 (90 Base) MCG/ACT AEPB, Inhale 2 puffs into the lungs every 4 (four) hours as needed., Disp: 1 each, Rfl: 3 .  SPIRIVA RESPIMAT 2.5 MCG/ACT AERS, INHALE 2 PUFFS INTO THE LUNGS DAILY., Disp: 1 Inhaler, Rfl: 5  Current Facility-Administered Medications:  Marland Kitchen  Mepolizumab SOLR 100 mg, 100 mg, Subcutaneous, Q28 days, Brand Males, MD, 100 mg at 06/20/17 0831 .  Mepolizumab SOLR 100 mg, 100 mg, Subcutaneous, Q28 days, Brand Males, MD, 100 mg at 07/24/17 1519 .  Mepolizumab SOLR 100 mg, 100 mg, Subcutaneous, Q28 days, Brand Males, MD, 100 mg at 08/24/17 0923 .  Mepolizumab SOLR 100 mg, 100 mg, Subcutaneous, Q28 days, Brand Males, MD, 100 mg at 09/27/17 1137 .  Mepolizumab SOLR 100 mg, 100 mg, Subcutaneous, Q28 days, Chase Caller, Jeanette Rauth, MD, 100 mg at 12/14/17 1021   Review of Systems     Objective:    Physical Exam  Constitutional: He is oriented to person, place, and time. He appears well-developed and well-nourished. No distress.  HENT:  Head: Normocephalic and atraumatic.  Right Ear: External ear normal.  Left Ear: External ear normal.  Mouth/Throat: Oropharynx is clear and moist. No oropharyngeal exudate.  Eyes: Conjunctivae and EOM are normal. Pupils are equal, round, and reactive to light. Right eye exhibits no discharge. Left eye exhibits no discharge. No scleral icterus.  Neck: Normal range of motion. Neck supple. No JVD present. No tracheal deviation present. No thyromegaly present.  Cardiovascular: Normal rate, regular rhythm and intact distal pulses. Exam reveals no gallop and no friction rub.  No murmur heard. Pulmonary/Chest: Effort normal. No respiratory distress. He has wheezes.  He has no rales. He exhibits no tenderness.  Abdominal: Soft. Bowel sounds are normal. He exhibits no distension and no mass. There is no tenderness. There is no rebound and no guarding.  Musculoskeletal: Normal range of motion. He exhibits no edema or tenderness.  Lymphadenopathy:    He has no cervical adenopathy.  Neurological: He is alert and oriented to person, place, and time. He has normal reflexes. No cranial nerve deficit. Coordination normal.  Skin: Skin is warm and dry. No rash noted. He is not diaphoretic. No erythema. No pallor.  Psychiatric: He has a normal mood and affect. His behavior is normal. Judgment and thought content normal.  Nursing note and vitals reviewed.  Vitals:   12/25/17 0928  Pulse: (!) 54  SpO2: 97%   Vitals:   12/25/17 0928  BP: 120/84  Pulse: (!) 54  SpO2: 97%  Weight: 198 lb 6.4 oz (90 kg)  Height: 5\' 11"  (1.803 m)        Assessment:       ICD-10-CM   1. Moderate persistent asthma with acute exacerbation J45.41   2. 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 Elevated IgE level R76.8   4. History of environmental allergies Z91.09        Plan:       Z  Pak Take prednisone 40 mg daily x 2 days, then 20mg  daily x 2 days, then 10mg  daily x 2 days, then 5mg  daily x 2 days and stop  Continue spiriva, breo, singulair Continue xolair but start paper work to change to [employer]  - take info on [employer] (we discussed this)  Talk to PCP Rita Ohara, MD and get new Shingrix vaccine - important eso with repeated prednisone  Followup 3 months or sooner - acq and feno at followup   Dr. Brand Males, M.D., [facility].C.P Pulmonary and Critical Care Medicine Staff Physician, [place] Director - Interstitial Lung Disease  Program  Pulmonary [place] [facility] [place], [place], [address]  Pager: [phone], If no answer or between  15:00h - 7:00h: call [phone] Telephone: [phone]

## 2017-12-25 NOTE — Addendum Note (Signed)
Addended by: Lorretta Harp on: 12/25/2017 10:51 AM   Modules accepted: Orders

## 2017-12-25 NOTE — Patient Instructions (Addendum)
ICD-10-CM   1. Moderate persistent asthma with acute exacerbation J45.41   2. 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 Elevated IgE level R76.8   4. History of environmental allergies Z91.09     Z Pak Take prednisone 40 mg daily x 2 days, then 20mg  daily x 2 days, then 10mg  daily x 2 days, then 5mg  daily x 2 days and stop  Continue spiriva, breo, singulair Continue xolair but start paper work to change to [employer]  - take info on [employer]  Talk to PCP Rita Ohara, MD and get new Shingrix vaccine - important eso with repeated prednisone  Followup 3 months or sooner - acq and feno at followup

## 2017-12-26 ENCOUNTER — Telehealth: Payer: Self-pay | Admitting: Internal Medicine

## 2017-12-26 NOTE — Telephone Encounter (Signed)
Spoke with patient he is aware that he is supposed to take that as well. Apologized that this was not gone over. Patient verbalized understanding.

## 2017-12-30 ENCOUNTER — Other Ambulatory Visit: Payer: Self-pay | Admitting: Internal Medicine

## 2018-01-05 ENCOUNTER — Other Ambulatory Visit: Payer: Self-pay | Admitting: Internal Medicine

## 2018-01-06 IMAGING — DX DG CHEST 2V
2 series · 2 of 2 positions shown · non-contrast
Comparison: 05/05/2016

CLINICAL DATA: Asthma.  Cough and chest tightness for 1.5 weeks.

EXAM:
CHEST  2 VIEW

[chest pa]
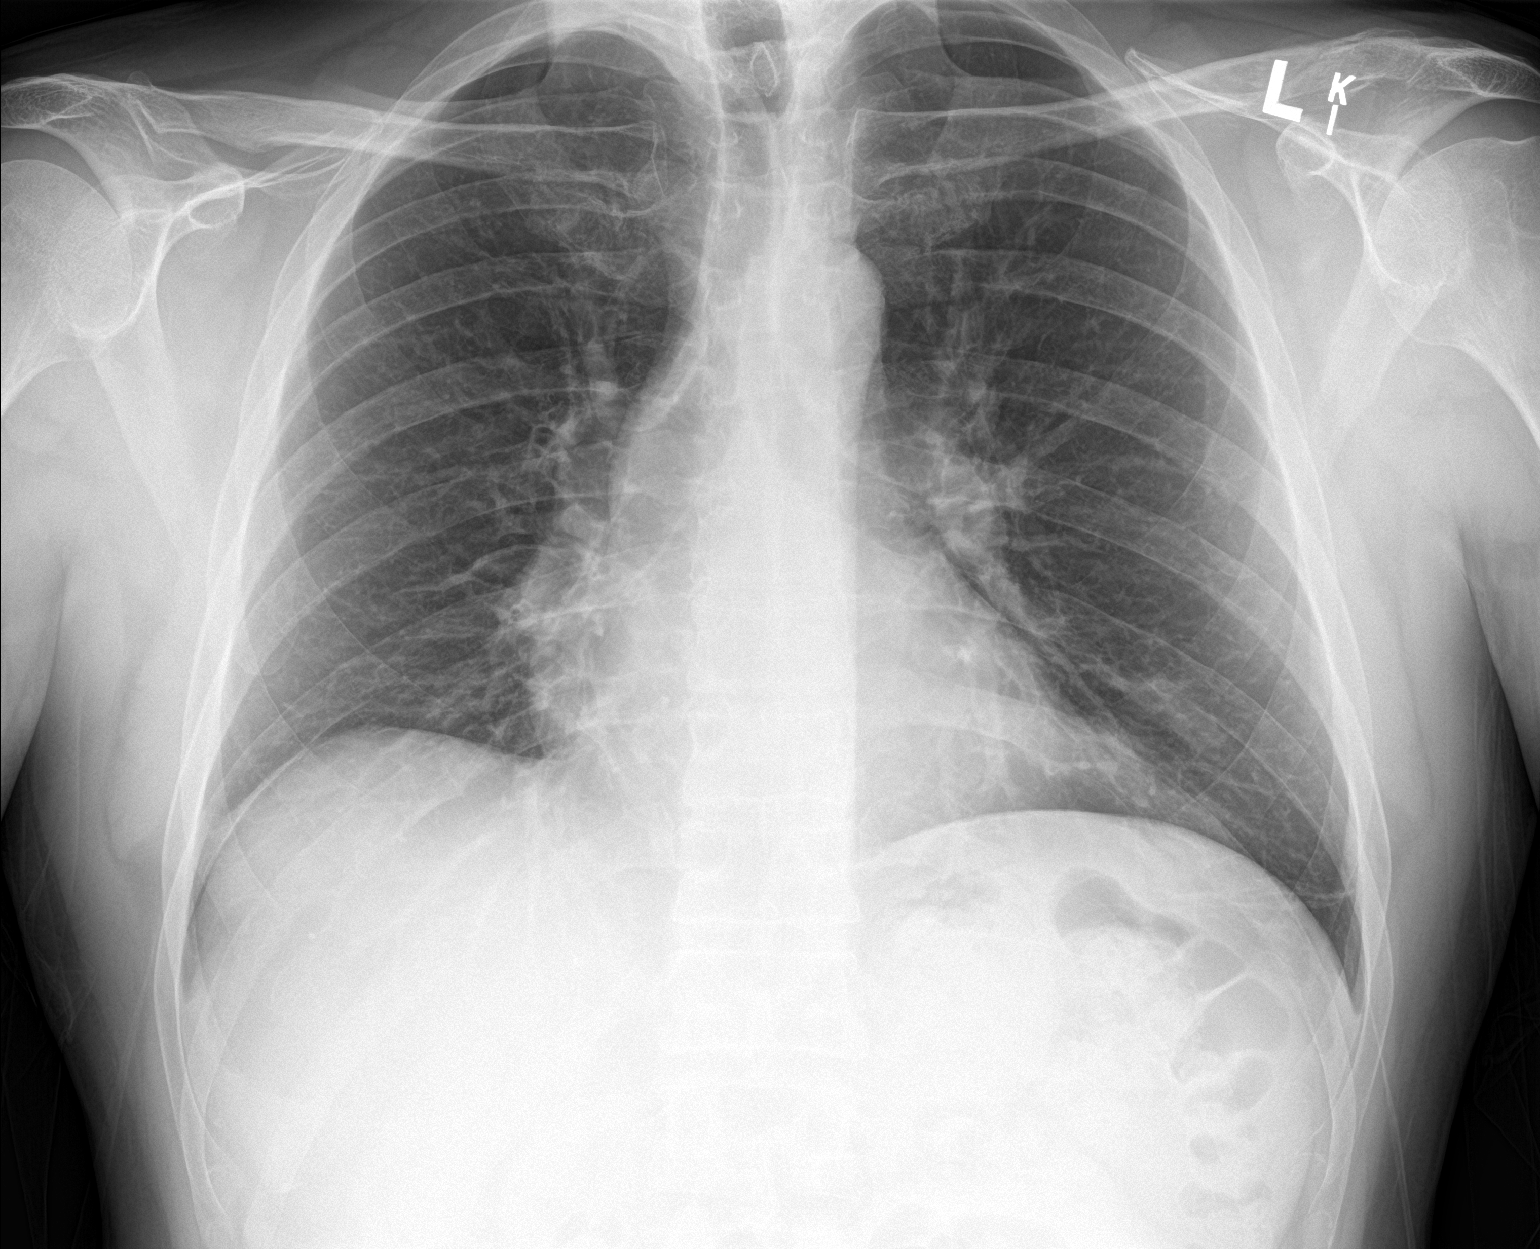

[chest lat]
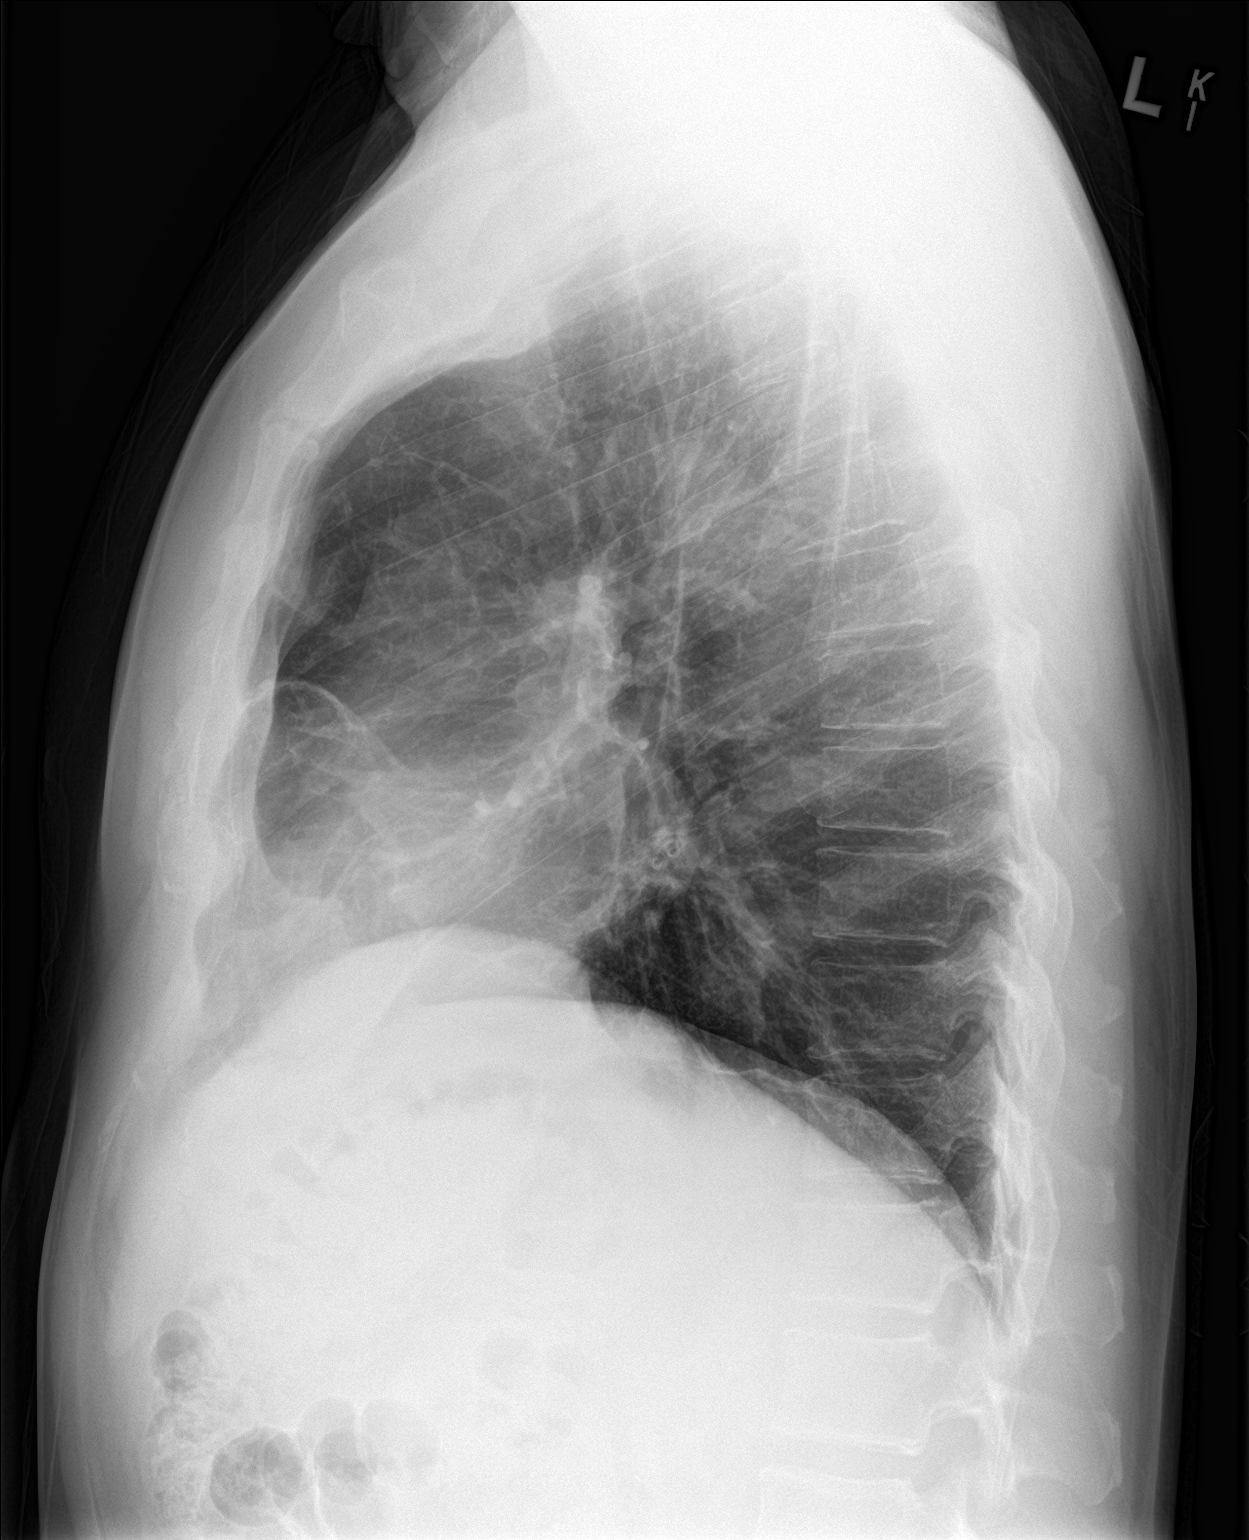

[2 of 2 positions shown; findings below may reference images not displayed]

FINDINGS: The cardiomediastinal silhouette is within normal limits. The lungs
are well inflated. There is mild curvilinear opacity in the left
lung base, predominantly in the lingula and slightly increased from
the prior study. No edema, pleural effusion, or pneumothorax is
identified. No acute osseous abnormality is seen.
IMPRESSION: Mild left basilar atelectasis.

## 2018-01-09 ENCOUNTER — Telehealth: Payer: Self-pay | Admitting: Internal Medicine

## 2018-01-09 NOTE — Telephone Encounter (Addendum)
Dupixent 2 prefilled syringes Arrival Date: 01/09/2018 Lot #:[id number] Exp date: 02/2019

## 2018-01-09 NOTE — Telephone Encounter (Signed)
Just a FYI protocol is for the pharmacy ( [facility]) is supposed to call us to set up del. And call the pt for updates and to give permission to ship. This pharm rep called the pt. He said "Yes, I will receive my injections at [facility] office." The rep. Sent the medication on to [place], instead of calling us and having [place] confirm del.Seth Kerr

## 2018-01-15 ENCOUNTER — Other Ambulatory Visit: Payer: Self-pay | Admitting: Internal Medicine

## 2018-01-17 ENCOUNTER — Ambulatory Visit: Payer: 59

## 2018-01-22 ENCOUNTER — Ambulatory Visit (INDEPENDENT_AMBULATORY_CARE_PROVIDER_SITE_OTHER): Payer: 59

## 2018-01-22 DIAGNOSIS — D721 Eosinophilia, unspecified: Secondary | ICD-10-CM

## 2018-01-31 ENCOUNTER — Telehealth: Payer: Self-pay | Admitting: Internal Medicine

## 2018-02-04 NOTE — Telephone Encounter (Signed)
Will do!

## 2018-02-04 NOTE — Telephone Encounter (Signed)
Routing this to TS for follow up.

## 2018-02-05 ENCOUNTER — Telehealth: Payer: Self-pay | Admitting: Internal Medicine

## 2018-02-05 ENCOUNTER — Ambulatory Visit: Payer: 59

## 2018-02-05 NOTE — Telephone Encounter (Signed)
Yes, I did speak with the pt. Not sure where [facility] came in the picture. [facility] is his pharmacy. They called him and

## 2018-02-05 NOTE — Telephone Encounter (Signed)
Tammy- have you spoken with pt? Can this be closed? Thanks

## 2018-02-08 ENCOUNTER — Ambulatory Visit (INDEPENDENT_AMBULATORY_CARE_PROVIDER_SITE_OTHER): Payer: 59

## 2018-02-08 DIAGNOSIS — D721 Eosinophilia, unspecified: Secondary | ICD-10-CM

## 2018-02-08 NOTE — Telephone Encounter (Signed)
1 prefilled syringe Ordered Date: 02/04/2018 Shipping Date: 02/05/2018

## 2018-02-14 NOTE — Telephone Encounter (Signed)
1 prefilled syringe Arrival Date: 02/06/18 Lot #:[id number] Exp date: 01/2019

## 2018-02-18 ENCOUNTER — Telehealth: Payer: Self-pay | Admitting: Internal Medicine

## 2018-02-18 NOTE — Telephone Encounter (Signed)
Too soon to order, pt has a dose on hand. Nothing further needed.

## 2018-02-27 ENCOUNTER — Ambulatory Visit (INDEPENDENT_AMBULATORY_CARE_PROVIDER_SITE_OTHER): Payer: 59

## 2018-02-27 DIAGNOSIS — D721 Eosinophilia, unspecified: Secondary | ICD-10-CM

## 2018-03-11 ENCOUNTER — Telehealth: Payer: Self-pay | Admitting: Internal Medicine

## 2018-03-11 NOTE — Telephone Encounter (Addendum)
1 prefilled syringe Ordered Date: 03/11/2018 Shipping Date: 03/12/2018        I called [name] then they let a message for the pt to call them back. Once he called them back and he gave his permission, [facility] called [place] to set up shipment. I was with a pt. And couldn't answer the ph. At the time. [name] did not give [place] a ship date or del. Date. I called the pt and asked him to callme back. (I skyped [place] and she let all the girls up front know to transfer the call to me. Waiting for pt. To call back.

## 2018-03-13 NOTE — Telephone Encounter (Signed)
1 prefilled syringe Arrival Date: 03/13/2018 Lot #:[id number] Exp date: 08/2019

## 2018-03-14 ENCOUNTER — Ambulatory Visit: Payer: 59

## 2018-03-14 DIAGNOSIS — M5416 Radiculopathy, lumbar region: Secondary | ICD-10-CM | POA: Diagnosis not present

## 2018-03-14 DIAGNOSIS — M9904 Segmental and somatic dysfunction of sacral region: Secondary | ICD-10-CM | POA: Diagnosis not present

## 2018-03-14 DIAGNOSIS — M9903 Segmental and somatic dysfunction of lumbar region: Secondary | ICD-10-CM | POA: Diagnosis not present

## 2018-03-15 NOTE — Progress Notes (Signed)
Documentation of medication administration and charges of Dupixent have been completed by Yamaira Spinner, CMA based on the Dupixent documentation sheet completed by Tammy Scott.   

## 2018-03-20 DIAGNOSIS — M9903 Segmental and somatic dysfunction of lumbar region: Secondary | ICD-10-CM | POA: Diagnosis not present

## 2018-03-20 DIAGNOSIS — M5416 Radiculopathy, lumbar region: Secondary | ICD-10-CM | POA: Diagnosis not present

## 2018-03-20 DIAGNOSIS — M9904 Segmental and somatic dysfunction of sacral region: Secondary | ICD-10-CM | POA: Diagnosis not present

## 2018-03-28 ENCOUNTER — Ambulatory Visit: Payer: 59

## 2018-03-29 ENCOUNTER — Ambulatory Visit (INDEPENDENT_AMBULATORY_CARE_PROVIDER_SITE_OTHER): Payer: 59

## 2018-03-29 DIAGNOSIS — J4540 Moderate persistent asthma, uncomplicated: Secondary | ICD-10-CM

## 2018-04-09 MED ORDER — DUPILUMAB 300 MG/2ML ~~LOC~~ SOSY
300.0000 mg | PREFILLED_SYRINGE | Freq: Once | SUBCUTANEOUS | Status: AC
Start: 2018-02-08 — End: 2018-02-08
  Administered 2018-02-08: 300 mg via SUBCUTANEOUS

## 2018-04-09 MED ORDER — DUPILUMAB 300 MG/2ML ~~LOC~~ SOSY
600.0000 mg | PREFILLED_SYRINGE | Freq: Once | SUBCUTANEOUS | Status: AC
Start: 2018-01-22 — End: 2018-01-22
  Administered 2018-01-22: 600 mg via SUBCUTANEOUS

## 2018-04-09 MED ORDER — DUPILUMAB 300 MG/2ML ~~LOC~~ SOSY
300.0000 mg | PREFILLED_SYRINGE | Freq: Once | SUBCUTANEOUS | Status: AC
  Administered 2018-02-27: 300 mg via SUBCUTANEOUS

## 2018-04-10 MED ORDER — DUPILUMAB 300 MG/2ML ~~LOC~~ SOSY
300.0000 mg | PREFILLED_SYRINGE | Freq: Once | SUBCUTANEOUS | Status: AC
  Administered 2018-03-29: 300 mg via SUBCUTANEOUS

## 2018-04-11 ENCOUNTER — Telehealth: Payer: Self-pay | Admitting: Internal Medicine

## 2018-04-11 NOTE — Telephone Encounter (Addendum)
1 prefilled syringe Ordered Date: 04/11/18 Shipping Date: 04/11/18  Order #: [id number]

## 2018-04-12 ENCOUNTER — Ambulatory Visit: Payer: 59

## 2018-04-12 NOTE — Telephone Encounter (Signed)
1 prefilled syringe Arrival Date: 04/12/18 Lot #:[id number] Exp date: 08/2019

## 2018-04-19 ENCOUNTER — Ambulatory Visit (INDEPENDENT_AMBULATORY_CARE_PROVIDER_SITE_OTHER): Payer: 59

## 2018-04-19 DIAGNOSIS — D721 Eosinophilia, unspecified: Secondary | ICD-10-CM

## 2018-04-23 MED ORDER — DUPILUMAB 300 MG/2ML ~~LOC~~ SOSY
300.0000 mg | PREFILLED_SYRINGE | Freq: Once | SUBCUTANEOUS | Status: AC
  Administered 2018-04-19: 300 mg via SUBCUTANEOUS

## 2018-04-26 ENCOUNTER — Ambulatory Visit (INDEPENDENT_AMBULATORY_CARE_PROVIDER_SITE_OTHER): Payer: 59 | Admitting: Internal Medicine

## 2018-04-26 ENCOUNTER — Encounter: Payer: Self-pay | Admitting: Internal Medicine

## 2018-04-26 VITALS — BP 110/78 | HR 55 | Ht 71.0 in | Wt 195.4 lb

## 2018-04-26 DIAGNOSIS — R768 Other specified abnormal immunological findings in serum: Secondary | ICD-10-CM

## 2018-04-26 DIAGNOSIS — D721 Eosinophilia, unspecified: Secondary | ICD-10-CM

## 2018-04-26 DIAGNOSIS — J4540 Moderate persistent asthma, uncomplicated: Secondary | ICD-10-CM | POA: Diagnosis not present

## 2018-04-26 NOTE — Progress Notes (Signed)
Subjective:     Patient ID: Seth Kerr, male   DOB: 1971/07/22, 47 y.o.   MRN: [id number]  HPI    IOV 06/12/2016  Chief Complaint  Patient presents with  . PULMONARY CONSULT    Referred by Dr. Mosetta Anis. Pt reports increasing breathing issues since moving to [place] 6 years ago. Pt had recent pna in April. Pt c/o continued SOB and chest tightness that is worse with exertion and some cough at night. Pt denies cough. Pt is on Breo and albuterol HFA. Pt is also on oral prednisone taper currently. Pt has significant second hand smoke exposure. Pt is on allergy injections weekly.     47 year old male works at [employer]. He is been referred by Dr. Donneta Romberg for evaluation of shortness of breath in the setting of confirmed diagnosis of asthma and allergies  History is gained from talking to the patient and also review of the referral notes. He tells me that he worked in the [employer] area. When he lived there he had no prior diagnosis of asthma respirator complaints. Moved to [facility] approximately 6 years ago and then within several months started noticing insidious onset of shortness of breath and chest tightness. For the last 4-5 years he has been seeing Dr. Donneta Romberg with a confirmed diagnosis of asthma. He was then started on inhaled steroids and Singulair. With this he also had allergy testing which was positive. He says once his treatments commenced he was able to return to normal activities of daily living including running long distances. He felt well. Approximately one year ago he stopped his allergy shots. Then in April 2017 he says he had a pneumonia for which she was treated with antibiotics. There is a chest x-ray report from 03/08/2016 that reports a nodular focus in the left lower lobe which could her percent focus of pneumonia but could not exclude a pulmonary nodule. I personally visualized this film and agree. He had follow-up chest x-ray 05/05/2016 that showed  improvement. He says that he was beginning to feel better but then started having nocturnal symptoms again with shortness of breath. Shortness of breath is exertional. His been going on for the last few weeks. He is now unable to climb stairs without feeling shortness of breath. Currently is in the middle of a prednisone taper  In terms of his allergies: He had repeat allergy testing April 2017 and it is positive for different environmental allergens. He has now been restarted on allergy shots.  He has had spirometry with Dr. Donneta Romberg. In 07/21/2015 FEV1 1.44 L/35% with a ratio 68 showing severe obstruction. Follow-up 03/08/2016 on the time of pneumonia FEV1 2.2 L/53% with a ratio of 79 with a 13% bronchodilator response. Again showing obstruction of moderate degree. He had a repeat spirometry 03/17/2016 which is only prebronchodilator that showed further improvement FEV1 2.68 L/64% ratio 79. And then again 06/05/2016 he had no further improvement with a post bronchodilator response FEV1 of 2.5 L/61 ratio 80 which is a 15% bronchi were to response. Exhaled nitric oxide on the same day was elevated at 59 ppb. Due to obstructive nature of the spirometry is been referred here.  Walking desaturation test on an 85 feet 3 laps on room at: Did not desaturate below 97%. NOTED BRADYCARDIA - HR 60s entire walk- walked fast  Exhaled nitric oxide today in our office: 26 ppb and normal (was 59 on 06/05/16 @ Dr Donneta Romberg in office )   Noted: he  is in middle of a 12d prednisone taper - 5 days left    has a past medical history of Allergic rhinitis, cause unspecified; Asthma; Erectile dysfunction; Irritable bowel syndrome (IBS); and Onychomycosis.   reports that he has never smoked. He has never used smokeless tobacco.   PFT 06/15/16  showed an FEV1 at 93%, ratio 83, FVC 88%, no significant bronchodilator response. Positive mid flow reversibility. DLCO 129%     07/27/2016 Acute OV  : Asthma  Patient returns for an  acute office visit. Complains for last 1.5 weeks that he has dry cough , wheezing, tightness , and dyspnea. Increased SABA use.  Patient was seen for pulmonary consult 06/15/2016 for asthma evaluation. Patient was having active asthma flare and was on prednisone taper. Marland Kitchen He was set up for a pulmonary function test on 06/15/2016 that showed significant improvement in lung function with an FEV1 at 93%, ratio 83% and no significant bronchodilator response. Previous spirometry had showed significant airflow obstructions with FEV1 ranging from 35-53%. It was felt that his lung function improved because of prednisone c/w asthma . He was being followed by Dr. Donneta Romberg , Allergist. He was previously on allergy vaccines but was stopped due to persistent asthma flares. Says he had no breathing problems until he moved here from [facility] 6 yrs ago. Previously ran marathons .  Exhaled nitric oxide test was 26. Previously had been 40. Today FENO is 87  - 07/27/16 Chart review shows very high eosinophils . CXR in April 2017 showed LLL nodular focus ? Pna .  Follow up cxr showed improvement in June.  Spiriva was added to his BREO last ov.    08/16/2016 Acute OV p  : Asthma  Patient returns for an acute office visit for persistent wheezing and cough .  Seen  2 weeks ago for asthma flare. Started on Steroid taper .  Felt some better but never stopped wheezing.  Labs showed IgE at, 254 + rast-tree/grass.  , High eosinophils on Diff , FeNO was elevated at 87  CXR showed mild opacity at left lung base.  CT chest done polylobar areas of scarring likely from previous pna, mild nodularity along lingula ? Scarring. No ILD changes.  He remains on BREO , Spiriva, Singulair , Xyzal and Nasonex .  Cough is keeping him up at night . Denies fever, chest pain, orthopnea , edema or hemoptysis, or discolored mucus.   Patient was seen for pulmonary consult 06/15/2016 for asthma evaluation. Patient was having active asthma flare and  was on prednisone taper. Marland Kitchen He was set up for a pulmonary function test on 06/15/2016 that showed significant improvement in lung function with an FEV1 at 93%, ratio 83% and no significant bronchodilator response. Previous spirometry had showed significant airflow obstructions with FEV1 ranging from 35-53%. It was felt that his lung function improved because of prednisone c/w asthma . He was being followed by Dr. Donneta Romberg , Allergist. He was previously on allergy vaccines but was stopped due to persistent asthma flares. Says he had no breathing problems until he moved here from [facility] 6 yrs ago. Previously ran marathons .     OV 08/23/2016  Chief Complaint  Patient presents with  . 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 Since then he had a CT chest and allergy evaluation. What is significant is that he is significantly high used to this. CT chest is clean. He's not had CT sinus but he does not have sinus problems. His exhaled nitric oxide has been elevated during exacerbations. He saw nurse practitioner in the interim early September 2017 was given prednisone. When he came back again in late September 2017 which was one week ago with another exacerbation and again given prednisone. Visit lasted prednisone and is here for follow-up. He feels that he is largely better. He is worried that he needs prednisone repeatedly. He is not sure that he can manage when he comes off the prednisone but he is not a try. His daughter has similar problems after moving from [place] to [place] and she is on Xolair and has good control. Therefore he is questions on this. He says he is compliant with his inhalers.  Asthma control question test: This is a 4 week questionnaire but I asked him how he felt in  the last 2 days. He feels most of the time his asthma prevents him from getting his work done.Marland Kitchen He feels dyspneic more than once a day. He wakes up frequently at night. He is using albuterol rescue one or 2 times daily. He feels his asthma is poorly controlled despite d6 of prednisone and chest very tight   Exhaled nitric oxide today: While on prednisone 6th day is elevated at 80pbo    09/19/2016 Acute OV p  : Asthma  Patient returns for an acute office visit for persistent wheezing and cough .  Seen  3 weeks ago for asthma flare. Started on medrol dose pack and zpack . Felt some better for couple of weeks then symptoms returned with dry cough and wheezing  Increased SABA use for last 2-3 days.  FeNO today is 56 . (no steroids for 2 weeks) .    47 yo male Never smoker seen for pulmonary consult 06/12/2016 for shortness of breath and asthma evaluation.   PFT 06/15/16  showed an FEV1 at 93%, ratio 83, FVC 88%, no significant bronchodilator response. Positive mid flow reversibility. DLCO 129% 07/27/16  IgE at, 254 + rast-tree/grass.  , High eosinophils on Diff , FeNO was elevated at 87  92017 CT chest done polylobar areas of scarring likely from previous pna, mild nodularity along lingula ? Scarring. No ILD changes.     He remains on BREO , Spiriva, Singulair , Xyzal and Nasonex .  Nucala paperwork has been started. Awaiting approval . We checked on process, waiting on PA .  Denies fever, chest pain, orthopnea , edema or hemoptysis, or discolored mucus.   Patient was seen for pulmonary consult 06/15/2016 for asthma evaluation. Patient was having active asthma flare and was on prednisone taper. Marland Kitchen He was set up for a pulmonary function test on 06/15/2016 that showed significant improvement in lung function with an FEV1 at 93%, ratio 83% and no significant bronchodilator response. Previous spirometry had showed significant airflow obstructions with FEV1 ranging from 35-53%. It was felt that his  lung function improved because of prednisone c/w asthma . He was being followed by Dr. Donneta Romberg , Allergist. He was previously on allergy vaccines but was stopped due to persistent asthma flares. Says he had no breathing problems until he moved here from [facility] 6 yrs ago. Previously ran marathons .   OV 10/24/2016  Chief Complaint  Patient presents with  . Follow-up  Pt saw TP on 10.31.17 for an acute visit. Pt c/o nonprod cough with intermittent chest congestion, increase in SOB. Pt deneis CP/tightness and f/c/s. Pt states he feels he worsens when he comes off the prednisone.      Follow-up for eosinophilic asthma  He still waiting for his interleukin-5 receptor antibody treatment to start. It is currently been approved for the shipment is pending. He tells me that he is unable to come off prednisone completely. Currently he is off prednisone for 2 weeks and he is insidious exacerbation with increased nocturnal symptoms, daily albuterol use multiple times and wheezing and chest tightness. He feels he needs another prednisone. He does not want to do daily prednisone. Preference is to start Nucala and see if he can avoid prednisone. He is frustrated    has a past medical history of Allergic rhinitis, cause unspecified; Asthma; Erectile dysfunction; Irritable bowel syndrome (IBS); and Onychomycosis.   reports that he has never smoked. He has never used smokeless tobacco.  OV 12/25/2016  Chief Complaint  Patient presents with  . Follow-up    Pt states since being on the Nucala his cough has improved but states his SOB has worsened and is using his nebs more. Pt states he has upper left chest pain infrequently.    Fu eos ashma  - s/p nucala start 11/08/2016/ Positive IgE at 284 and significant allergy test positive but currently not on allergy shots  He's completed 2 doses of monthly interleukin-5 receptor antibody treatment for asthma (nucal he says this is improved his cough). He also tells  me that he has not been on prednisone since strting nucala but according to chart review he did take one episode of prednisone January 2018. He does not recollect this. He tells me that cough is significantly improved since starting  nucala but he still gets episodes of chest tightness particularly wakes him up from sleeping. He is using albuterol rescue 3 times a day which she thinks is a little more than his baseline is no fever or sputum production is not any allergy shots. He tells me is compliant with the Brio and Singulair and Spiriva     exhaled nitric oxide today is elevated at 80 ppb   he does have a lung nodule from sept 2017  Arabi 03/05/2017  Chief Complaint  Patient presents with  . Follow-up    2 month follow up after CT scan and blood work. States he is not coughing at much as he used to.      Follow-up very poorly controlled asthma despite compliance associated with high IgE and I eosinophilia.  He is on interleukin-5 receptor antibody injectable therapy. He says this is only helped his cough but overall there is no improvement and shortness of breath the frequency of asthma exacerbations her sense of well-being are similar to scale down his Brio, Singulair and Spiriva treatment. He just does not feel optimized. He feels he is wheezing all the time in fact today also is wheezing. He wants a 5 day prednisone. Most recently had CT chest that did not show lung nodule and ruled out ABPA. His blood eosinophilic count is improved with injectable therapy      OV 06/26/2017  Chief Complaint  Patient presents with  . Follow-up    Pt states he is not on Xolair yet. Pt states he feels he is doing well. Pt states he only uses his albuterol hfa once a month. Pt denies SOB, cough, CP/tightness, f/c/s.  Follow-up allergy poorly controlled asthma with high eosinophils and high IgE  He is on Spiriva, inhaled corticosteroids and long-acting beta agonist and Singulair. He is also on  interleukin-5 receptor antibody nucala. Despite this treatment he has had poorly controlled asthma despite the reported compliance. We then decided to introduce Xolair as an add-on therapy. We did have discussions with him about the unknown safety and efficacy data about doing combination. I did talk to 1 national experts or regional experts. I then we'll change the plan to rotate him to Xolair and stop the interleukin-5 receptor antibody. All this has taken some months. In the interim his asthma is continued to do better. He likes the interleukin-5 receptor antibody and therefore he does not want to stop it currently feels his asthma is well controlled. His last prednisone was in April 2018. He currently is working out in [employer] and able to swim and run. His daughter is on Xolair therapy which works well for her.  Of note his exhaled nitric oxide continues to be high and it is unclear why   OV 12/25/2017  Chief Complaint  Patient presents with  . Follow-up    Pt states he has been doing good. Nucala injections have been helping. Pt states he has had some flareups and had to use his rescue inhaler twice yesterday, 12/24/17 and did two neb treatments. Pt has complaints of chest tightness and SOB but not much of a cough.    47 year old male with allergic asthma with eosinophilia and elevated IgE.  On Spiriva, Brio, Singulair and interleukin-5 receptor antibody injections monthly Nucala  Overall he says his health status is improved because of the biologic injections against asthma but he still having frequent breakthrough exacerbations requiring prednisone.  Few months ago we tried to switch him to Xolair but then he wanted to hold off.  At this point in time he is willing to try another biological.  Today he says that he is got increased asthma exacerbation with increased wheezing and shortness of breath and chest tightness.  Asthma control questionnaire is 3 showing that he is waking up a few times at  night and when he wakes up he has moderate symptoms.  His activities are slightly limited because of asthma and is short of breath quite a lot and is wheezing a lot of the time and is using albuterol for rescue 1-2 puffs.  He works in [employer] and does 2 weeks of night shifts and 2 weeks of day shifts.  The night shift or harderAnd is wondering if this is because of diurnal variation of asthma   OV 04/26/2018  Chief Complaint  Patient presents with  . Follow-up    57-month follow up. Pt states he has been doing well since last visit and denies any complaints. Pt is on dupixent and is doing great.   Seth Kerr , 47 y.o. , with dob [birth year] and male ,Not Hispanic or Latino from [address] [place] [address] - presents to lung clinic for difficult to control asthm with blood eos and high IgE.  Last visit was in February 2019.  At that time we switched his Nucala to dupilumab.  He has been taking diplomat now for the last 2 to 3 months.  He says that there is the best ever he has been in his entire life from asthma.  His asthma control question is now 0.  He was able to bike 50 miles in the heat.  He was able to run also for miles.  He has not used albuterol in many months.  His last prednisone was many months ago.  He is not waking up in the middle of the night with symptoms his chest does not feel tight when he wakes up.  He is not limited to do all in his activities because of asthma he is not having any shortness of breath or wheezing or cough because of asthma.  At this point in time he is taking Breo, Spiriva, Singulair but has run out of his antihistamine.  He is asking for further reduction in his therapy.  His exam nitric oxide is 8 and it has never been normal in  the entire time that I have seen him.      acq 5 point score        3 0   Ref. Range 06/12/2016 16:09 07/27/2016 16:55 08/23/2016 16:14 09/19/2016 09:30 12/25/2016 09:00 06/26/2017  12/25/2017  04/26/2018   Nitric Oxide  Unknown 26 87 80 56 84 84 90 8  treatment      On nucala since dec 2017 nucaka nucala Firs followup on dupixent x 2-3 months       has a past medical history of Allergic rhinitis, cause unspecified, Asthma, Erectile dysfunction, Irritable bowel syndrome (IBS), and Onychomycosis.   reports that he has never smoked. He has never used smokeless tobacco.  Past Surgical History:  Procedure Laterality Date  . SEPTOPLASTY  approx age 14  . SHOULDER SURGERY Right   . TONSILLECTOMY  age 20    Allergies  Allergen Reactions  . Aspirin Other (See Comments)    Not actual allergy, had chronic nosebleeds as a child and was instructed not to use aspirin.    Immunization History  Administered Date(s) Administered  . Influenza Split 09/12/2012, 08/29/2014, 09/05/2016  . Influenza,inj,Quad PF,6+ Mos 10/09/2013, 09/03/2015, 08/29/2017  . Pneumococcal Polysaccharide-23 05/05/2015  . Tdap 10/04/2010, 04/22/2014    Family History  Problem Relation Age of Onset  . Cancer Mother        lung cancer, smoker  . Alcohol abuse Mother   . Cancer Father        stomach cancer, smoker  . Alcohol abuse Father   . Asthma Daughter   . Diabetes Neg Hx   . Heart disease Neg Hx      Current Outpatient Medications:  .  albuterol (PROVENTIL) (2.5 MG/3ML) 0.083% nebulizer solution, Take 3 mLs (2.5 mg total) by nebulization every 6 (six) hours as needed for wheezing or shortness of breath., Disp: 75 mL, Rfl: 3 .  BREO ELLIPTA 200-25 MCG/INH AEPB, USE 1 PUFF BY MOUTH EVERY DAY, Disp: 60 each, Rfl: 5 .  Dupilumab (DUPIXENT) 300 MG/2ML SOSY, Inject 300 mg into the skin., Disp: , Rfl:  .  EPIPEN 2-PAK 0.3 MG/0.3ML SOAJ injection, Use as directed, Disp: 1 Device, Rfl: 1 .  mometasone (NASONEX) 50 MCG/ACT nasal spray, Place 2 sprays into the nose 2 (two) times daily. Reported on 05/17/2016, Disp: 17 g, Rfl: 4 .  montelukast (SINGULAIR) 10 MG tablet, Take 1 tablet (10 mg total) by mouth at bedtime. Reported on  03/15/2016, Disp: 90 tablet, Rfl: 4 .  predniSONE (DELTASONE) 10 MG tablet, Take 40mg x2days,20mg x2days,10mg x2days,5mg x2days,then stop, Disp: 15 tablet, Rfl: 0 .  PROAIR RESPICLICK 410 (90 Base) MCG/ACT AEPB, Inhale 2 puffs into the lungs every 4 (four) hours as needed., Disp: 1 each, Rfl: 5 .  SPIRIVA RESPIMAT 2.5 MCG/ACT AERS, INHALE 2 PUFFS  INTO THE LUNGS ONCE DAILY, Disp: 1 Inhaler, Rfl: 5  Current Facility-Administered Medications:  Marland Kitchen  Mepolizumab SOLR 100 mg, 100 mg, Subcutaneous, Q28 days, Brand Males, MD, 100 mg at 06/20/17 0831 .  Mepolizumab SOLR 100 mg, 100 mg, Subcutaneous, Q28 days, Brand Males, MD, 100 mg at 07/24/17 1519 .  Mepolizumab SOLR 100 mg, 100 mg, Subcutaneous, Q28 days, Brand Males, MD, 100 mg at 08/24/17 0923 .  Mepolizumab SOLR 100 mg, 100 mg, Subcutaneous, Q28 days, Brand Males, MD, 100 mg at 09/27/17 1137 .  Mepolizumab SOLR 100 mg, 100 mg, Subcutaneous, Q28 days, Chase Caller, Samantha Olivera, MD, 100 mg at 12/14/17 1021   Review of Systems     Objective:   Physical Exam  Constitutional: He is oriented to person, place, and time. He appears well-developed and well-nourished. No distress.  HENT:  Head: Normocephalic and atraumatic.  Right Ear: External ear normal.  Left Ear: External ear normal.  Mouth/Throat: Oropharynx is clear and moist. No oropharyngeal exudate.  Eyes: Pupils are equal, round, and reactive to light. Conjunctivae and EOM are normal. Right eye exhibits no discharge. Left eye exhibits no discharge. No scleral icterus.  Neck: Normal range of motion. Neck supple. No JVD present. No tracheal deviation present. No thyromegaly present.  Cardiovascular: Normal rate, regular rhythm and intact distal pulses. Exam reveals no gallop and no friction rub.  No murmur heard. Pulmonary/Chest: Effort normal and breath sounds normal. No respiratory distress. He has no wheezes. He has no rales. He exhibits no tenderness.  Abdominal: Soft. Bowel  sounds are normal. He exhibits no distension and no mass. There is no tenderness. There is no rebound and no guarding.  Musculoskeletal: Normal range of motion. He exhibits no edema or tenderness.  Lymphadenopathy:    He has no cervical adenopathy.  Neurological: He is alert and oriented to person, place, and time. He has normal reflexes. No cranial nerve deficit. Coordination normal.  Skin: Skin is warm and dry. No rash noted. He is not diaphoretic. No erythema. No pallor.  Psychiatric: He has a normal mood and affect. His behavior is normal. Judgment and thought content normal.  Nursing note and vitals reviewed.  Vitals:   04/26/18 1633  BP: 110/78  Pulse: (!) 55  SpO2: 98%  Weight: 195 lb 6.4 oz (88.6 kg)  Height: 5\' 11"  (1.803 m)       Assessment:       ICD-10-CM   1. Moderate persistent extrinsic asthma without complication W41.32   2. 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 Elevated IgE level R76.8        Plan:      Amazing benefit from dupuxient after you switched to it from [place] is well controlled at this point  Plan No need to restart xyzal (you already ran out) Stop spiriva Continue breo daily  Continue montelukast daily Continue Q2 week dupixent injections Use albuterol as needed   Followup Sept/oct 2019 for asthma followup; ACQ and FeNO at followup Come sooner if needed     Dr. Brand Males, M.D., [facility].C.P Pulmonary and Critical Care Medicine Staff Physician, [place] Director - Interstitial Lung Disease  Program  Pulmonary [facility] [place], [place], [address]  Pager: [phone], If no answer or between  15:00h - 7:00h: call [phone] Telephone: [phone]

## 2018-04-26 NOTE — Patient Instructions (Addendum)
ICD-10-CM   1. Moderate persistent extrinsic asthma without complication X83.38   2. 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 Elevated IgE level R76.8    Amazing benefit from dupuxient after you switched to it from [place] is well controlled at this point  Plan No need to restart xyzal (you already ran out) Stop spiriva Continue breo daily  Continue montelukast daily Continue Q2 week dupixent injections Use albuterol as needed   Followup Sept/oct 2019 for asthma followup; ACQ and FeNO at followup Come sooner if needed

## 2018-05-03 ENCOUNTER — Ambulatory Visit: Payer: 59

## 2018-05-03 ENCOUNTER — Ambulatory Visit (INDEPENDENT_AMBULATORY_CARE_PROVIDER_SITE_OTHER): Payer: 59

## 2018-05-03 DIAGNOSIS — J4540 Moderate persistent asthma, uncomplicated: Secondary | ICD-10-CM | POA: Diagnosis not present

## 2018-05-06 MED ORDER — DUPILUMAB 300 MG/2ML ~~LOC~~ SOSY
300.0000 mg | PREFILLED_SYRINGE | Freq: Once | SUBCUTANEOUS | Status: AC
  Administered 2018-05-03: 300 mg via SUBCUTANEOUS

## 2018-05-20 ENCOUNTER — Ambulatory Visit: Payer: 59

## 2018-05-20 ENCOUNTER — Telehealth: Payer: Self-pay | Admitting: Internal Medicine

## 2018-05-20 NOTE — Telephone Encounter (Signed)
Dupixent is coming in 05/21/18. Pt. Didn't order med. So it didn't come in. I called [name] they could ship his med today for tomorrow del.. Pt. Is going to call me back and rsc.in case he needs an appt. On Tues. Or Wed. Afternoon. I will put the order in [facility] smart text.

## 2018-05-20 NOTE — Telephone Encounter (Signed)
1 prefilled syringe Ordered Date: 05/20/18 Shipping Date: 05/20/18

## 2018-05-21 NOTE — Telephone Encounter (Signed)
Dupixent did come in. Will call pt. And sch appt. Nothing further needed.

## 2018-05-21 NOTE — Telephone Encounter (Signed)
1 prefilled syringe Arrival Date: 05/21/18 Lot #:[id number] Exp date: 09/2019

## 2018-05-22 ENCOUNTER — Telehealth: Payer: Self-pay | Admitting: Internal Medicine

## 2018-05-22 NOTE — Telephone Encounter (Signed)
Called pt back, scheduled Dupixent inj for Fri.. Nothing further needed.

## 2018-05-24 ENCOUNTER — Ambulatory Visit (INDEPENDENT_AMBULATORY_CARE_PROVIDER_SITE_OTHER): Payer: 59

## 2018-05-24 DIAGNOSIS — J4550 Severe persistent asthma, uncomplicated: Secondary | ICD-10-CM | POA: Diagnosis not present

## 2018-05-28 MED ORDER — DUPILUMAB 300 MG/2ML ~~LOC~~ SOSY
300.0000 mg | PREFILLED_SYRINGE | Freq: Once | SUBCUTANEOUS | Status: AC
  Administered 2018-05-24: 300 mg via SUBCUTANEOUS

## 2018-05-28 NOTE — Progress Notes (Signed)
Documentation of medication administration and charges of Dupxient have been completed by Katya Rolston, CMA based on the Dupxient documentation sheet completed by Tammy Scott.   

## 2018-05-29 DIAGNOSIS — M25512 Pain in left shoulder: Secondary | ICD-10-CM | POA: Diagnosis not present

## 2018-05-29 DIAGNOSIS — M9903 Segmental and somatic dysfunction of lumbar region: Secondary | ICD-10-CM | POA: Diagnosis not present

## 2018-05-29 DIAGNOSIS — M5416 Radiculopathy, lumbar region: Secondary | ICD-10-CM | POA: Diagnosis not present

## 2018-06-07 ENCOUNTER — Ambulatory Visit: Payer: 59

## 2018-06-07 ENCOUNTER — Ambulatory Visit (INDEPENDENT_AMBULATORY_CARE_PROVIDER_SITE_OTHER): Payer: 59

## 2018-06-07 DIAGNOSIS — J4540 Moderate persistent asthma, uncomplicated: Secondary | ICD-10-CM | POA: Diagnosis not present

## 2018-06-10 MED ORDER — DUPILUMAB 300 MG/2ML ~~LOC~~ SOSY
300.0000 mg | PREFILLED_SYRINGE | Freq: Once | SUBCUTANEOUS | Status: AC
  Administered 2018-06-07: 300 mg via SUBCUTANEOUS

## 2018-06-13 ENCOUNTER — Telehealth: Payer: Self-pay | Admitting: Internal Medicine

## 2018-06-13 DIAGNOSIS — M25512 Pain in left shoulder: Secondary | ICD-10-CM | POA: Diagnosis not present

## 2018-06-13 DIAGNOSIS — M5416 Radiculopathy, lumbar region: Secondary | ICD-10-CM | POA: Diagnosis not present

## 2018-06-13 DIAGNOSIS — M9903 Segmental and somatic dysfunction of lumbar region: Secondary | ICD-10-CM | POA: Diagnosis not present

## 2018-06-13 NOTE — Telephone Encounter (Signed)
I called [name] b/c I thought the pt. Told me the last time he ordered his [facility] our office needed to call them. "No." The [name] rep I spoke to today said they just need the pt to call them to set up the del..  I called the pt, he said he would call [name] if not today, tomorrow. Nothing further needed.

## 2018-06-14 ENCOUNTER — Telehealth: Payer: Self-pay | Admitting: Internal Medicine

## 2018-06-14 NOTE — Telephone Encounter (Signed)
1 prefilled syringe Ordered Date: 06/14/18 Shipping Date: 06/17/18  Pt. Ordered, I confirmed del (06/18/18) and our address.

## 2018-06-14 NOTE — Telephone Encounter (Signed)
Called pt, lm with vm. Just making him aware I called [name] to confirm his shipment. It will be here 06/18/18. Rep mentioned his appt. Was 06/19/18. I told him he could call back and cancel his 06/21/18 appt and reschdule for 06/19/18 if he would like to do so.Seth Kerr

## 2018-06-18 NOTE — Telephone Encounter (Signed)
1 prefilled syringe Arrival Date: 06/18/18 Lot #:[id number] Exp date: 10/2019

## 2018-06-20 DIAGNOSIS — M9903 Segmental and somatic dysfunction of lumbar region: Secondary | ICD-10-CM | POA: Diagnosis not present

## 2018-06-20 DIAGNOSIS — M25512 Pain in left shoulder: Secondary | ICD-10-CM | POA: Diagnosis not present

## 2018-06-20 DIAGNOSIS — M5416 Radiculopathy, lumbar region: Secondary | ICD-10-CM | POA: Diagnosis not present

## 2018-06-21 ENCOUNTER — Ambulatory Visit (INDEPENDENT_AMBULATORY_CARE_PROVIDER_SITE_OTHER): Payer: 59

## 2018-06-21 DIAGNOSIS — J4540 Moderate persistent asthma, uncomplicated: Secondary | ICD-10-CM

## 2018-06-24 MED ORDER — DUPILUMAB 300 MG/2ML ~~LOC~~ SOSY
300.0000 mg | PREFILLED_SYRINGE | Freq: Once | SUBCUTANEOUS | Status: AC
  Administered 2018-06-21: 300 mg via SUBCUTANEOUS

## 2018-07-05 ENCOUNTER — Ambulatory Visit: Payer: 59

## 2018-07-12 ENCOUNTER — Ambulatory Visit (INDEPENDENT_AMBULATORY_CARE_PROVIDER_SITE_OTHER): Payer: 59

## 2018-07-12 DIAGNOSIS — D721 Eosinophilia, unspecified: Secondary | ICD-10-CM

## 2018-07-12 DIAGNOSIS — R768 Other specified abnormal immunological findings in serum: Secondary | ICD-10-CM

## 2018-07-12 MED ORDER — DUPILUMAB 300 MG/2ML ~~LOC~~ SOSY
300.0000 mg | PREFILLED_SYRINGE | Freq: Once | SUBCUTANEOUS | Status: AC
  Administered 2018-07-12: 300 mg via SUBCUTANEOUS

## 2018-07-12 NOTE — Progress Notes (Signed)
Documentation of medication administration and charges of Dupxient have been completed by Lindsay Lemons, CMA based on the hand written Dupxient documentation sheet completed by Tammy Scott, who administered the medication.  

## 2018-07-19 ENCOUNTER — Other Ambulatory Visit: Payer: Self-pay | Admitting: Internal Medicine

## 2018-07-25 ENCOUNTER — Telehealth: Payer: Self-pay | Admitting: Internal Medicine

## 2018-07-25 NOTE — Telephone Encounter (Signed)
Called pt made him aware med may not be here til 10:30 or later. Rsc appt. Will wait for del.Marland Kitchen

## 2018-07-25 NOTE — Telephone Encounter (Signed)
Pt stated he is returning a call from [facility].   Tammy please advise. Thanks

## 2018-07-25 NOTE — Telephone Encounter (Signed)
1 prefilled syringe Ordered Date: 07/25/18 Shipping Date: 07/25/18

## 2018-07-26 ENCOUNTER — Ambulatory Visit: Payer: 59

## 2018-07-26 ENCOUNTER — Ambulatory Visit (INDEPENDENT_AMBULATORY_CARE_PROVIDER_SITE_OTHER): Payer: 59

## 2018-07-26 DIAGNOSIS — D721 Eosinophilia, unspecified: Secondary | ICD-10-CM

## 2018-07-26 MED ORDER — DUPILUMAB 300 MG/2ML ~~LOC~~ SOSY
300.0000 mg | PREFILLED_SYRINGE | Freq: Once | SUBCUTANEOUS | Status: AC
  Administered 2018-07-26: 300 mg via SUBCUTANEOUS

## 2018-07-26 NOTE — Telephone Encounter (Signed)
1 prefilled syringe Arrival Date: 07/26/18 Lot #:[id number] Exp date: 11/2019

## 2018-07-28 ENCOUNTER — Encounter: Payer: Self-pay | Admitting: Family Medicine

## 2018-08-03 IMAGING — CT CT CHEST W/O CM
2 of 3 series · 15 of 36 positions shown, 18 images · non-contrast
Comparison: CT chest dated 08/15/2016

CLINICAL DATA: Asthma, follow-up CT

EXAM:
CT CHEST WITHOUT CONTRAST
TECHNIQUE: Multidetector CT imaging of the chest was performed following the
standard protocol without IV contrast.

[Series 2: thorax · axial · 0.74mm/px · z∈[-310,-34]mm · 12 of 162 slices shown, 15 images]
[im 12/162  mediastinal]
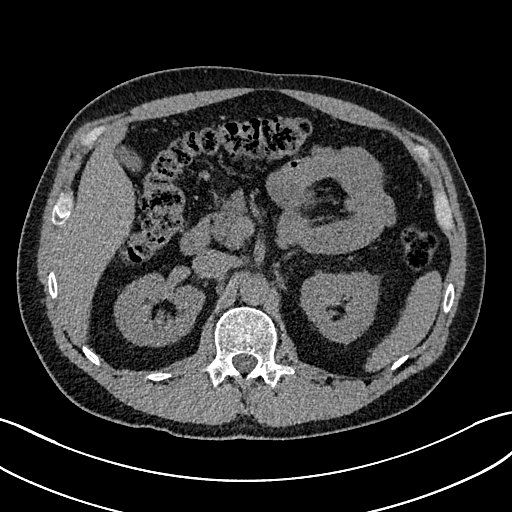
[im 12/162  lung]
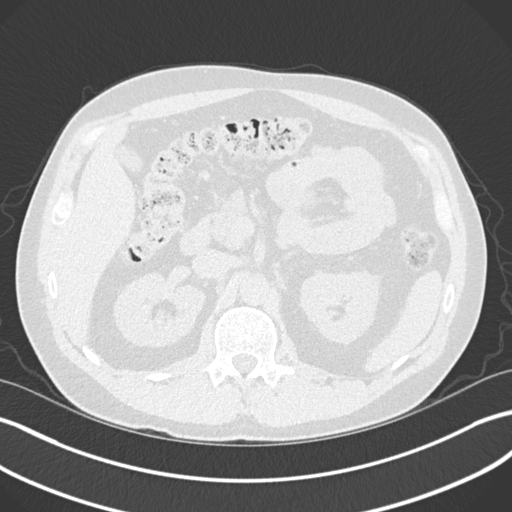
[im 24/162  lung]
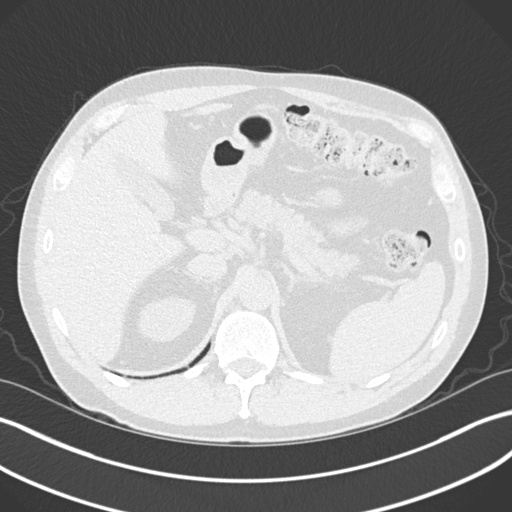
[im 36/162  lung]
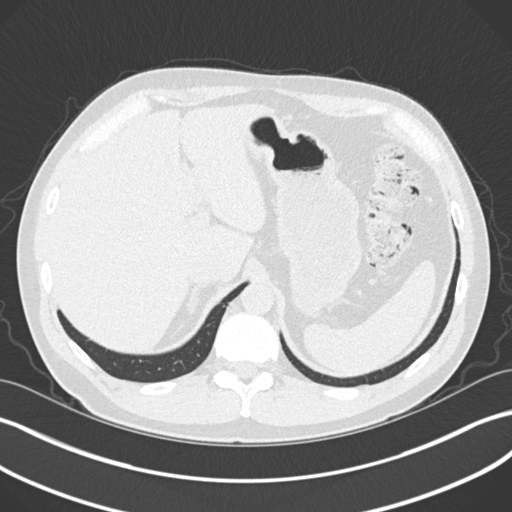
[im 48/162  lung]
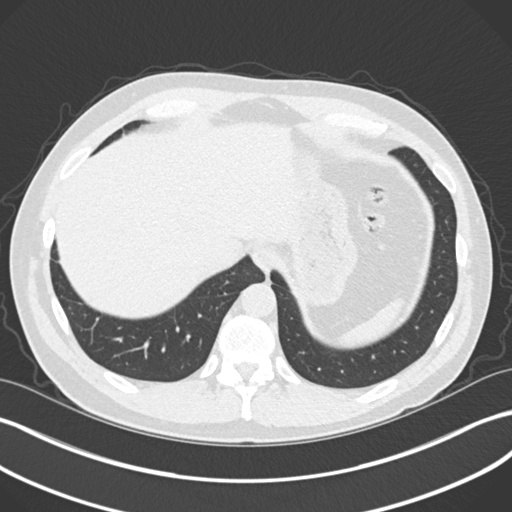
[im 60/162  mediastinal]
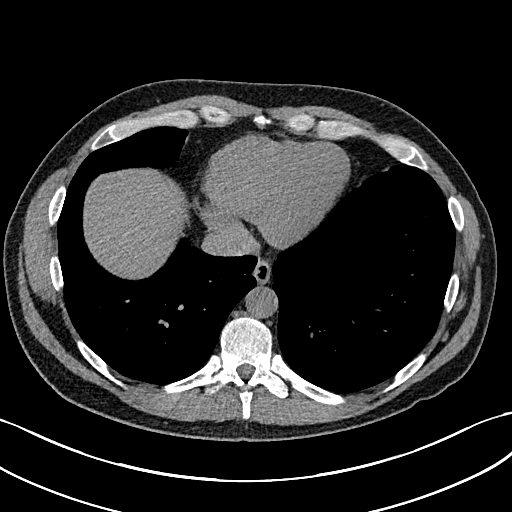
[im 60/162  lung]
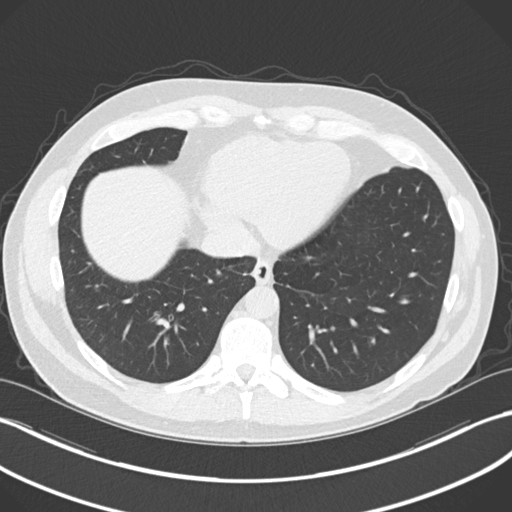
[im 72/162  lung]
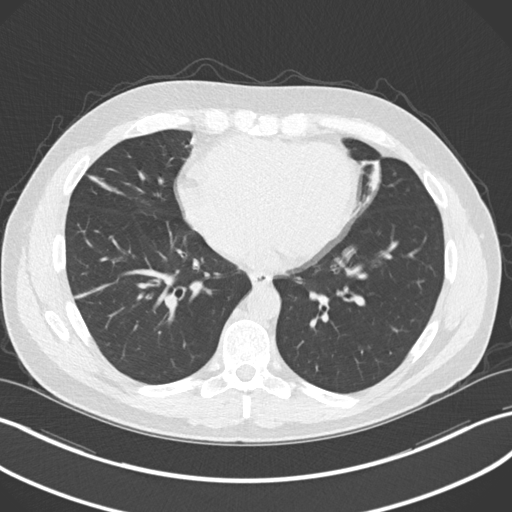
[im 90/162  lung]
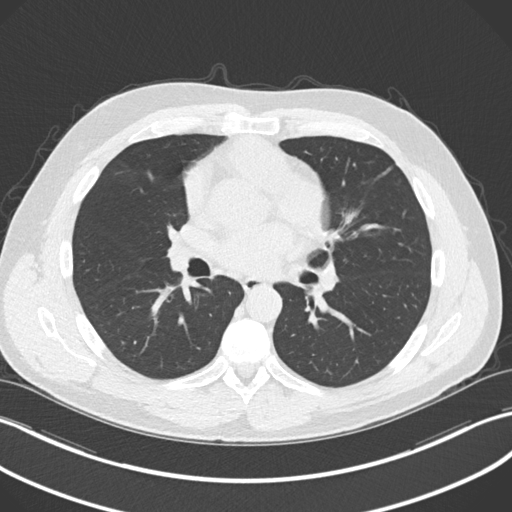
[im 102/162  lung]
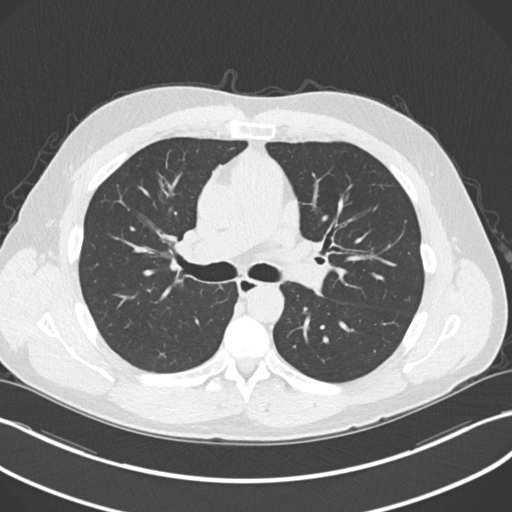
[im 114/162  mediastinal]
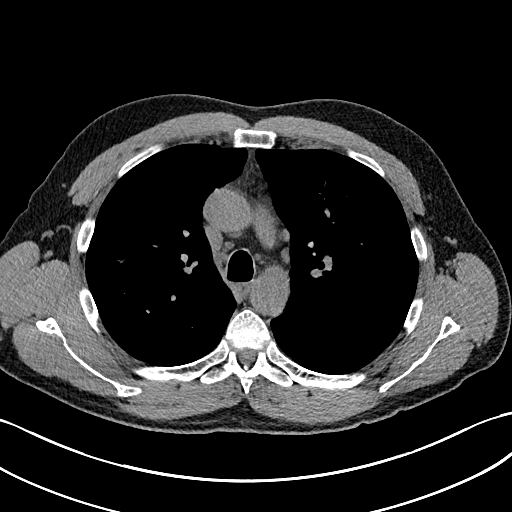
[im 114/162  lung]
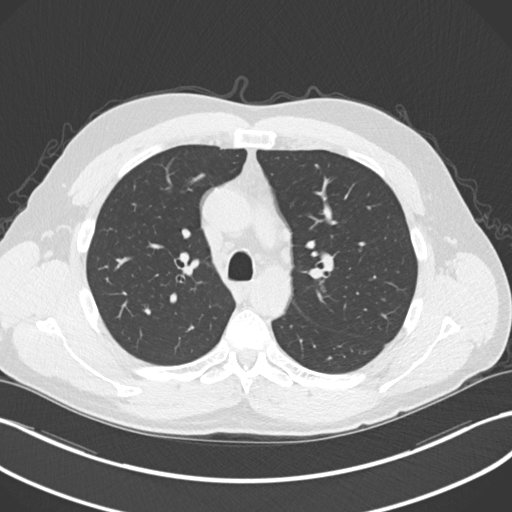
[im 126/162  lung]
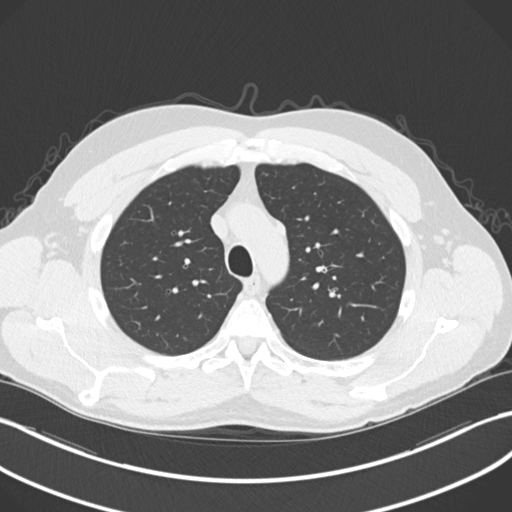
[im 138/162  lung]
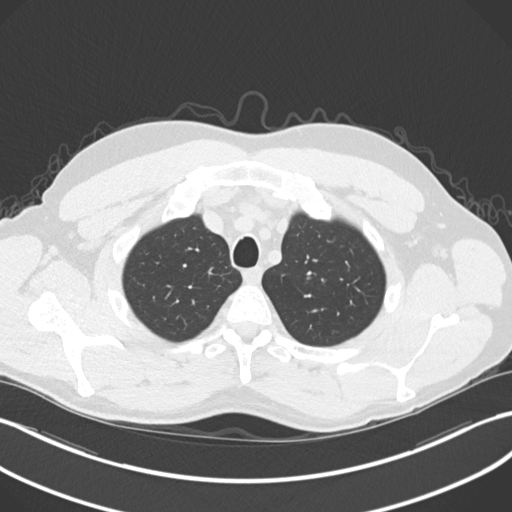
[im 150/162  lung]
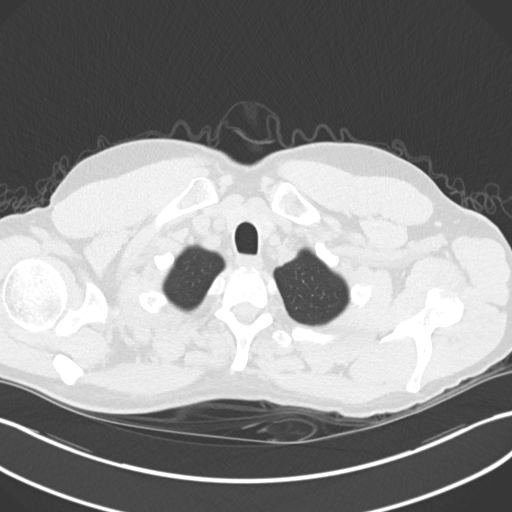

[Series 5: coronal · coronal · 0.63mm/px · 3 of 142 slices shown]
[im 29/142  lung]
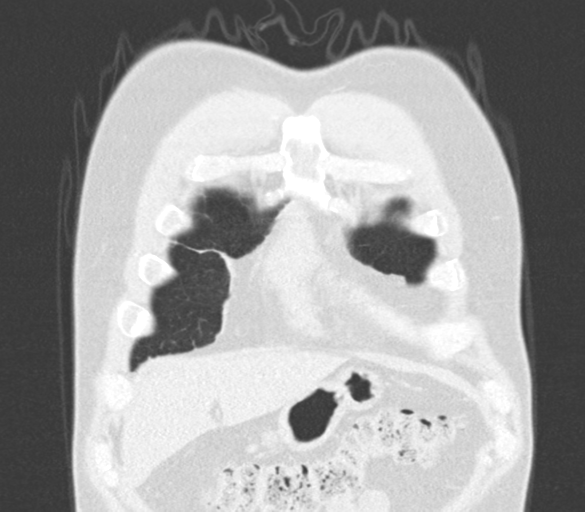
[im 57/142  lung]
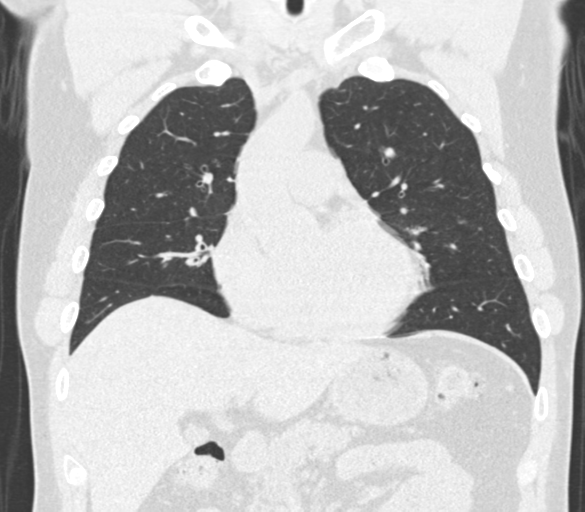
[im 85/142  lung]
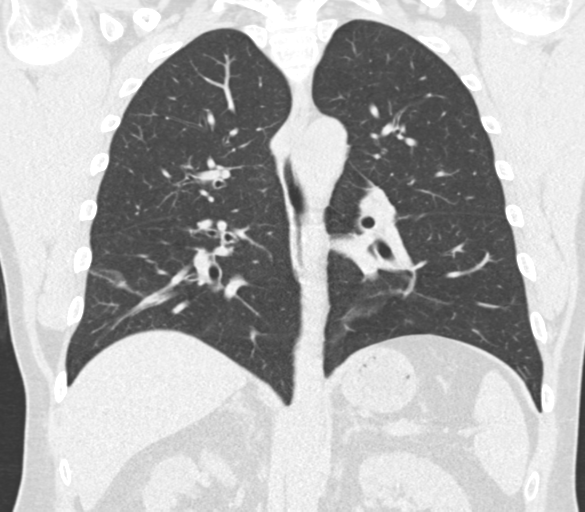

[15 of 36 positions shown; findings below may reference images not displayed]

FINDINGS: Cardiovascular: Heart is normal in size.  No pericardial effusion.

No evidence of thoracic aortic aneurysm.

Mediastinum/Nodes: Small mediastinal lymph nodes which do not meet
pathologic CT size criteria.

Visualized thyroid is unremarkable.

Lungs/Pleura: No suspicious pulmonary nodules.

Linear scarring in the lingula, right middle lobe, and right lower
lobe.

No focal consolidation.

No pleural effusion or pneumothorax.

Upper Abdomen: Visualized upper abdomen is unremarkable.

Musculoskeletal: Visualized osseous structures are within normal
limits.
IMPRESSION: No evidence of acute cardiopulmonary disease.

Scattered areas of scarring, benign.

## 2018-08-09 ENCOUNTER — Ambulatory Visit (INDEPENDENT_AMBULATORY_CARE_PROVIDER_SITE_OTHER): Payer: 59

## 2018-08-09 DIAGNOSIS — D721 Eosinophilia, unspecified: Secondary | ICD-10-CM

## 2018-08-09 MED ORDER — DUPILUMAB 300 MG/2ML ~~LOC~~ SOSY
300.0000 mg | PREFILLED_SYRINGE | Freq: Once | SUBCUTANEOUS | Status: AC
  Administered 2018-08-09: 300 mg via SUBCUTANEOUS

## 2018-08-21 ENCOUNTER — Telehealth: Payer: Self-pay | Admitting: Internal Medicine

## 2018-08-21 NOTE — Telephone Encounter (Signed)
1 prefilled syringe Ordered Date: 08/21/18 Shipping Date: 08/21/18 

## 2018-08-22 NOTE — Telephone Encounter (Signed)
1 prefilled syringe Arrival Date: 08/22/18 Lot #:[id number] Exp date: 12/2019

## 2018-08-23 ENCOUNTER — Ambulatory Visit (INDEPENDENT_AMBULATORY_CARE_PROVIDER_SITE_OTHER): Payer: 59

## 2018-08-23 DIAGNOSIS — D721 Eosinophilia, unspecified: Secondary | ICD-10-CM

## 2018-08-23 MED ORDER — DUPILUMAB 300 MG/2ML ~~LOC~~ SOSY
300.0000 mg | PREFILLED_SYRINGE | Freq: Once | SUBCUTANEOUS | Status: AC
  Administered 2018-08-23: 300 mg via SUBCUTANEOUS

## 2018-08-28 ENCOUNTER — Other Ambulatory Visit (INDEPENDENT_AMBULATORY_CARE_PROVIDER_SITE_OTHER): Payer: 59

## 2018-08-28 DIAGNOSIS — Z23 Encounter for immunization: Secondary | ICD-10-CM

## 2018-09-01 NOTE — Progress Notes (Deleted)
Seth Kerr is a 47 y.o. male who presents for a complete physical.  He has the following concerns:  Asthma and eosinophilia with elevated IgE levels: Last saw Dr. Chase Caller in June. He is doing extremely well on Dupixent injections q2 weeks.  At his last visit the Spiriva was stopped, told to continue the montelukast, Breo ane dupixent injections, use albuterol prn.  He last needed albuterol UPDATE  Allergies:  ?continues to se Dr. Donneta Romberg, getting immunotherapy  Immunization History  Administered Date(s) Administered  . Influenza Split 09/12/2012, 08/29/2014, 09/05/2016  . Influenza,inj,Quad PF,6+ Mos 10/09/2013, 09/03/2015, 08/29/2017, 08/28/2018  . Pneumococcal Polysaccharide-23 05/05/2015  . Tdap 10/04/2010, 04/22/2014   Last colonoscopy: done at approx age [age], and another one age 7. Reportedly normal. Diagnosed with IBS Last PSA: reports having had one done at age 19 (through work) Pharmacist, community: twice yearly Ophtho: yearly Exercise: 4-5 days/week weights and running or road biking. (12 hrs shifts are 2 weeks days, then switches to 2 weeks nights). Lipids: Lab Results  Component Value Date   CHOL 210 (H) 05/17/2016   HDL 102 05/17/2016   LDLCALC 98 05/17/2016   TRIG 50 05/17/2016   CHOLHDL 2.1 05/17/2016   Vitamin D and thyroid screens done 04/2014, normal (D was 45)   ROS: The patient denies anorexia, fever, headaches,vision loss, decreased hearing, ear pain, hoarseness, palpitations, dizziness, syncope, dyspnea on exertion, cough, swelling, nausea, vomiting, diarrhea, constipation, abdominal pain, melena, hematochezia, indigestion/heartburn, hematuria, incontinence, nocturia, dysuria, genital lesions, numbness, tingling, weakness, tremor, suspicious skin lesions, depression, anxiety, abnormal bleeding/bruising, or enlarged lymph nodes No further problems with ED. Asthma/allergies are controlled. 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   No further problems with toenail discoloration (h/o treatment with lamisil twice in the past).     PHYSICAL EXAM:  Wt Readings from Last 3 Encounters:  04/26/18 195 lb 6.4 oz (88.6 kg)  12/25/17 198 lb 6.4 oz (90 kg)  08/29/17 194 lb 9.6 oz (88.3 kg)    General Appearance:   Alert, cooperative, no distress, appears stated age  Head:   Normocephalic, without obvious abnormality, atraumatic  Eyes:   PERRL, conjunctiva/corneas clear, EOM's intact, fundi benign  Ears:   Normal TM's and external ear canals  Nose:  Nares normal, mucosa is mod edematous with clear drainage bilaterally. Sinuses nontenderUPDATE  Throat:  Lips, mucosa, and tongue normal; teeth and gums normal. OP is clear.  Neck:  Supple, no lymphadenopathy; thyroid: noenlargement/tenderness/ nodules; no carotid bruit or JVD  Back:  Spine nontender, no curvature, ROM normal, no CVA tenderness  Lungs:  Respirations unlabored, good air movement. No wheezing, rales, ronchi  Chest Wall:   No tenderness or deformity  Heart:   Regular rate and rhythm, S1 and S2 normal, no murmur, rub or gallop  Breast Exam:   No chest wall tenderness, masses or gynecomastia  Abdomen:   Soft, non-tender, nondistended, normoactive bowel sounds, no masses, no hepatosplenomegaly  Genitalia:   Normal male external genitalia without lesions. Testicles without masses. No inguinal hernias.  Rectal:   Normal sphincter tone, no masses or tenderness; guaiac negative stool. Prostate smooth, no nodules, not enlarged.  Extremities:  No clubbing, cyanosis or edema.   Pulses:  2+ and symmetric all extremities  Skin:  Skin color, texture, turgor normal, no rashes or lesions.Toenails are normal  Lymph nodes:  Cervical, supraclavicular, and axillary nodes normal  Neurologic:  CNII-XII intact, normal strength,  sensation and gait; reflexes  2+ and symmetric throughout   Psych: Normal mood, affect, hygiene and grooming   Update nasal exam  ASSESSMENT/PLAN:  ?routine labs--CBC, c-met, lipids TSH if sx Likely won't need labs again until age 35 if all okay.   Discussed PSA screening (risks/benefits)--start at age 36; recommended at least 30 minutes of aerobic activity at least 5 days/week, weight-bearing exercise at least 2x/week; proper sunscreen use reviewed; healthy diet and alcohol recommendations (less than or equal to 2 drinks/day) reviewed; regular seatbelt use; changing batteries in smoke detectors, carbon monoxide detectors ecommended. Self-testicular exams. Immunization recommendations discussed--continue yearly flu shots. Colonoscopy recommendations reviewed--due again age 15 (sooner prn problems).  F/u 1 year for CPE, sooner prn.

## 2018-09-02 ENCOUNTER — Encounter: Payer: 59 | Admitting: Family Medicine

## 2018-09-06 ENCOUNTER — Ambulatory Visit (INDEPENDENT_AMBULATORY_CARE_PROVIDER_SITE_OTHER): Payer: 59

## 2018-09-06 DIAGNOSIS — J4540 Moderate persistent asthma, uncomplicated: Secondary | ICD-10-CM | POA: Diagnosis not present

## 2018-09-09 MED ORDER — DUPILUMAB 300 MG/2ML ~~LOC~~ SOSY
300.0000 mg | PREFILLED_SYRINGE | Freq: Once | SUBCUTANEOUS | Status: AC
  Administered 2018-09-06: 300 mg via SUBCUTANEOUS

## 2018-09-09 NOTE — Progress Notes (Signed)
Documented by Jessica Jones CMA based on hand-written Dupixent documentation sheet completed by Tammy Scott CMA, who administered the medication.  

## 2018-09-12 DIAGNOSIS — M9903 Segmental and somatic dysfunction of lumbar region: Secondary | ICD-10-CM | POA: Diagnosis not present

## 2018-09-12 DIAGNOSIS — M5442 Lumbago with sciatica, left side: Secondary | ICD-10-CM | POA: Diagnosis not present

## 2018-09-12 DIAGNOSIS — M5136 Other intervertebral disc degeneration, lumbar region: Secondary | ICD-10-CM | POA: Diagnosis not present

## 2018-09-16 ENCOUNTER — Telehealth: Payer: Self-pay | Admitting: Internal Medicine

## 2018-09-16 NOTE — Telephone Encounter (Signed)
1 prefilled syringe Ordered Date: 09/16/18 Shipping Date: 09/18/18

## 2018-09-19 NOTE — Telephone Encounter (Signed)
1 prefilled syringe Arrival Date: 09/19/18 Lot #:[id number] Exp date: 08/2020

## 2018-09-20 ENCOUNTER — Ambulatory Visit (INDEPENDENT_AMBULATORY_CARE_PROVIDER_SITE_OTHER): Payer: 59

## 2018-09-20 DIAGNOSIS — D721 Eosinophilia, unspecified: Secondary | ICD-10-CM

## 2018-09-20 MED ORDER — DUPILUMAB 300 MG/2ML ~~LOC~~ SOSY
300.0000 mg | PREFILLED_SYRINGE | Freq: Once | SUBCUTANEOUS | Status: AC
  Administered 2018-09-20: 300 mg via SUBCUTANEOUS

## 2018-09-20 NOTE — Progress Notes (Signed)
Documented by Rayla Pember CMA based on hand-written Dupixent documentation sheet completed by Tammy Scott CMA, who administered the medication.  

## 2018-09-24 NOTE — Progress Notes (Addendum)
Chief Complaint  Patient presents with  . Annual Exam    fasting annual exam, did not do eye exam as he just had one. Had had a right sided abd pain x few months, but no other concerns.     Seth Kerr is a 47 y.o. male who presents for a complete physical.  He has the following concerns:  Sunday(3 days ago) he had fever 101, felt achey.  Took tylenol, zinc. The next day he had some right shoulder/neck pain.  T 101.6 later that afternoon (2d ago).  Fever broke that night.  Felt fine yesterday.  Slight achiness to throat. Currently feels fine. Never had URI symptoms, just very rare cough, achiness in upper shoulders/back.   He is going on a cruise in January, asking for patches.  Hasn't used in the past.  Right flank pain, intermittent x 2 months.  Not with activity, notices when on the couch in the evening.  Hasn't tried heat or any pain relievers.  Denies any urinary complaints.  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 This has enabled him to be much more active.  Plans to do [employer] 70.3 next year.  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 Chase Caller in June. He is doing extremely well on Dupixent injections q2 weeks.  At his last visit the Spiriva was stopped, told to continue the montelukast, Breo and dupixent injections, use albuterol prn.  He can't recall the last time he needed albuterol.  Allergies: He no longer sees Dr. Donneta Romberg, no longer getting immunotherapy.  Hasn't been bothered by any allergies in a while, since being on the [place].  Immunization History  Administered Date(s) Administered  . Influenza Split 09/12/2012, 08/29/2014, 09/05/2016  . Influenza,inj,Quad PF,6+ Mos 10/09/2013, 09/03/2015, 08/29/2017, 08/28/2018  . Pneumococcal Polysaccharide-23 05/05/2015  . Tdap 10/04/2010, 04/22/2014   Last colonoscopy: done at approx age [age], and another one age  55. Reportedly normal. Diagnosed with IBS Last PSA: reports having had one done at age 77 (through work) Pharmacist, community: twice yearly Ophtho: yearly Exercise:5 days/week, bikes/swims (not since community pool closed), runs.  Weights 4x/week.  Did century ride at [employer] this Fall. Lipids: Lab Results  Component Value Date   CHOL 210 (H) 05/17/2016   HDL 102 05/17/2016   LDLCALC 98 05/17/2016   TRIG 50 05/17/2016   CHOLHDL 2.1 05/17/2016   Vitamin D and thyroid screens done 04/2014, normal (D was 45)  Past Medical History:  Diagnosis Date  . Allergic rhinitis, cause unspecified    immunotherapy x 28yrs (stopped end of 2015), per Dr. Donneta Romberg  . Asthma   . Erectile dysfunction   . Irritable bowel syndrome (IBS)    has had 2 colonoscopies  . Onychomycosis    s/p lamisil x 2 courses    Past Surgical History:  Procedure Laterality Date  . SEPTOPLASTY  approx age 50  . SHOULDER SURGERY Right   . TONSILLECTOMY  age 43    Social History   Socioeconomic History  . Marital status: Married    Spouse name: Not on file  . Number of children: 3  . Years of education: Not on file  . Highest education level: Not on file  Occupational History  . Occupation: Curator at [employer]  . Financial resource strain: Not on file  . Food insecurity:  Worry: Not on file    Inability: Not on file  . Transportation needs:    Medical: Not on file    Non-medical: Not on file  Tobacco Use  . Smoking status: Never Smoker  . Smokeless tobacco: Never Used  Substance and Sexual Activity  . Alcohol use: Yes    Comment: 2 glasses of wine (or vodka soda) 5x/week  . Drug use: No  . Sexual activity: Yes    Partners: Female  Lifestyle  . Physical activity:    Days per week: Not on file    Minutes per session: Not on file  . Stress: Not on file  Relationships  . Social connections:    Talks on phone: Not on file    Gets  together: Not on file    Attends religious service: Not on file    Active member of club or organization: Not on file    Attends meetings of clubs or organizations: Not on file    Relationship status: Not on file  . Intimate partner violence:    Fear of current or ex partner: Not on file    Emotionally abused: Not on file    Physically abused: Not on file    Forced sexual activity: Not on file  Other Topics Concern  . Not on file  Social History Narrative   Lives with wife, 1/3 kids, 3 dogs. Moved here from [facility]. 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   1 daughter--[place], 1 in [employer], 1 is senior at [employer].    Family History  Problem Relation Age of Onset  . Cancer Mother        lung cancer, smoker  . Alcohol abuse Mother   . Cancer Father        stomach cancer, smoker  . Alcohol abuse Father   . Asthma Daughter   . Diabetes Neg Hx   . Heart disease Neg Hx     Outpatient Encounter Medications as of 09/25/2018  Medication Sig Note  . BREO ELLIPTA 200-25 MCG/INH AEPB USE 1 PUFF BY MOUTH EVERY DAY   . Dupilumab (DUPIXENT [place]) Inject into the skin every 14 (fourteen) days.   . montelukast (SINGULAIR) 10 MG tablet Take 1 tablet (10 mg total) by mouth at bedtime. Reported on 03/15/2016   . albuterol (PROVENTIL) (2.5 MG/3ML) 0.083% nebulizer solution Take 3 mLs (2.5 mg total) by nebulization every 6 (six) hours as needed for wheezing or shortness of breath. (Patient not taking: Reported on 09/25/2018)   . EPIPEN 2-PAK 0.3 MG/0.3ML SOAJ injection Use as directed (Patient not taking: Reported on 09/25/2018)   . mometasone (NASONEX) 50 MCG/ACT nasal spray Place 2 sprays into the nose 2 (two) times daily. Reported on 05/17/2016 (Patient not taking: Reported on 09/25/2018) 08/29/2017: Uses nasal steroid seasonally, usually OTC kind  . PROAIR RESPICLICK 161 (90 Base) MCG/ACT AEPB  Inhale 2 puffs into the lungs every 4 (four) hours as needed. (Patient not taking: Reported on 09/25/2018)   . [DISCONTINUED] predniSONE (DELTASONE) 10 MG tablet Take 40mg x2days,20mg x2days,10mg x2days,5mg x2days,then stop   . [DISCONTINUED] SPIRIVA RESPIMAT 2.5 MCG/ACT AERS INHALE 2 PUFFS INTO THE LUNGS ONCE DAILY    Facility-Administered Encounter Medications as of 09/25/2018  Medication  . Mepolizumab SOLR 100 mg  . Mepolizumab SOLR 100 mg  . Mepolizumab SOLR 100 mg  . Mepolizumab SOLR 100 mg  . Mepolizumab SOLR 100  mg    Allergies  Allergen Reactions  . Aspirin Other (See Comments)    Not actual allergy, had chronic nosebleeds as a child and was instructed not to use aspirin.    ROS: The patient denies anorexia, headaches (just during recent illness),vision loss, decreased hearing, ear pain, hoarseness, palpitations, dizziness, syncope, dyspnea on exertion, cough, swelling, nausea, vomiting, diarrhea, constipation, abdominal pain, melena, hematochezia, indigestion/heartburn, hematuria, incontinence, nocturia, dysuria, genital lesions, numbness, tingling, weakness, tremor, suspicious skin lesions, depression, anxiety, abnormal bleeding/bruising, or enlarged lymph nodes No further problems with ED. Asthma/allergies are controlled. Recent febrile illness per HPI, resolved. Residual nasal congestion on exam.   PHYSICAL EXAM:  BP 110/70   Pulse (!) 56   Temp (!) 96.5 F (35.8 C) (Tympanic)   Ht 5' 9.5" (1.765 m)   Wt 192 lb 9.6 oz (87.4 kg)   BMI 28.03 kg/m   Wt Readings from Last 3 Encounters:  09/25/18 192 lb 9.6 oz (87.4 kg)  04/26/18 195 lb 6.4 oz (88.6 kg)  12/25/17 198 lb 6.4 oz (90 kg)    General Appearance:   Alert, cooperative, no distress, appears stated age  Head:   Normocephalic, without obvious abnormality, atraumatic  Eyes:   PERRL, conjunctiva/corneas clear, EOM's intact, fundi benign  Ears:   Normal TM's and external ear canals  Nose:   Nares normal, mucosa is mod edematous on left severe on the right. No significant erythema, no drainage. Sinuses nontender  Throat:  Lips, mucosa, and tongue normal; teeth and gums normal. OP is clear.  Neck:  Supple, no lymphadenopathy; thyroid: noenlargement/tenderness/ nodules; no carotid bruit or JVD  Back:  Spine nontender, no curvature, ROM normal, no CVA tenderness  Lungs:  Respirations unlabored, good air movement. No wheezing, rales, ronchi  Chest Wall:   No tenderness or deformity  Heart:   Regular rate and rhythm, S1 and S2 normal, no murmur, rub or gallop  Breast Exam:   No chest wall tenderness, masses or gynecomastia  Abdomen:   Soft, non-tender, nondistended, normoactive bowel sounds, no masses, no hepatosplenomegaly  Genitalia:   Normal male external genitalia without lesions. Testicles without masses. No inguinal hernias.  Rectal:   Normal sphincter tone, no masses or tenderness; no stool in rectal vault for heme testing. Prostate smooth, no nodules, notenlarged.  Extremities:  No clubbing, cyanosis or edema.   Pulses:  2+ and symmetric all extremities  Skin:  Skin color, texture, turgor normal, no rashes or lesions.Toenails are normal. Right forearm has a cluster of freckles (small pigmented flat areas), slightly lighter in the center.  Some freckles on upper back (along with tattoos), and suntanned at neck.  Lymph nodes:  Cervical, supraclavicular, and axillary nodes normal  Neurologic:  CNII-XII intact, normal strength, sensation and gait; reflexes 2+ and symmetric throughout   Psych: Normal mood, affect, hygiene and grooming   Urine dip normal   ASSESSMENT/PLAN:  Annual physical exam - Plan: POCT Urinalysis DIP (Proadvantage Device), Comprehensive metabolic panel, CBC with Differential/Platelet, Lipid panel  H/O motion sickness - Plan: scopolamine  (TRANSDERM-SCOP, 1.5 MG,) 1 MG/3DAYS  Right flank pain - intermittent, suspect muscular. Reassured, supportive measures revewed   Recent febrile illness, likely viral, resolved/resolving.  Supportive measures reviewed, not really bothered by the nasal congestion. Reviewed restarting antihistamines and/or nasal steroids if/when allergies start to bother him.  Asthma is very well controlled on current regimen.   Discussed PSA screening (risks/benefits)--start at age 88; recommended at least 30 minutes of aerobic activity at least  5 days/week, weight-bearing exercise at least 2x/week; proper sunscreen use reviewed; healthy diet and alcohol recommendations (less than or equal to 2 drinks/day) reviewed; regular seatbelt use; changing batteries in smoke detectors, carbon monoxide detectors ecommended. Self-testicular exams. Immunization recommendations discussed--continue yearly flu shots. Colonoscopy recommendations reviewed--due again age 51 (sooner prn problems).  F/u 1 year for CPE, sooner prn.

## 2018-09-24 NOTE — Patient Instructions (Addendum)
  HEALTH MAINTENANCE RECOMMENDATIONS:  It is recommended that you get at least 30 minutes of aerobic exercise at least 5 days/week (for weight loss, you may need as much as 60-90 minutes). This can be any activity that gets your heart rate up. This can be divided in 10-15 minute intervals if needed, but try and build up your endurance at least once a week.  Weight bearing exercise is also recommended twice weekly.  Eat a healthy diet with lots of vegetables, fruits and fiber.  "Colorful" foods have a lot of vitamins (ie green vegetables, tomatoes, red peppers, etc).  Limit sweet tea, regular sodas and alcoholic beverages, all of which has a lot of calories and sugar.  Up to 2 alcoholic drinks daily may be beneficial for men (unless trying to lose weight, watch sugars).  Drink a lot of water.  Sunscreen of at least SPF 30 should be used on all sun-exposed parts of the skin when outside between the hours of 10 am and 4 pm (not just when at beach or pool, but even with exercise, golf, tennis, and yard work!)  Use a sunscreen that says "broad spectrum" so it covers both UVA and UVB rays, and make sure to reapply every 1-2 hours.  Remember to change the batteries in your smoke detectors when changing your clock times in the spring and fall.  Use your seat belt every time you are in a car, and please drive safely and not be distracted with cell phones and texting while driving.  Consider using meclizine (found over-the-counter, one brand is Dramamine-N, or Bonine is another) to help with motion sickness/vertigo if not using the patch.  Try some heat, stretches and massage when you have the back/flank discomfort. Consider incorporating some core strengthening like yoga.

## 2018-09-25 ENCOUNTER — Encounter: Payer: Self-pay | Admitting: Family Medicine

## 2018-09-25 ENCOUNTER — Ambulatory Visit (INDEPENDENT_AMBULATORY_CARE_PROVIDER_SITE_OTHER): Payer: 59 | Admitting: Family Medicine

## 2018-09-25 VITALS — BP 110/70 | HR 56 | Temp 96.5°F | Ht 69.5 in | Wt 192.6 lb

## 2018-09-25 DIAGNOSIS — Z0000 Encounter for general adult medical examination without abnormal findings: Secondary | ICD-10-CM | POA: Diagnosis not present

## 2018-09-25 DIAGNOSIS — Z87898 Personal history of other specified conditions: Secondary | ICD-10-CM | POA: Diagnosis not present

## 2018-09-25 DIAGNOSIS — R109 Unspecified abdominal pain: Secondary | ICD-10-CM | POA: Diagnosis not present

## 2018-09-25 LAB — POCT URINALYSIS DIP (PROADVANTAGE DEVICE)
Bilirubin, UA: NEGATIVE
Blood, UA: NEGATIVE
Glucose, UA: NEGATIVE mg/dL
Ketones, POC UA: NEGATIVE mg/dL
Leukocytes, UA: NEGATIVE
Nitrite, UA: NEGATIVE
PROTEIN UA: NEGATIVE mg/dL
SPECIFIC GRAVITY, URINE: 1.02
UUROB: NEGATIVE
pH, UA: 6 (ref 5.0–8.0)

## 2018-09-25 MED ORDER — SCOPOLAMINE 1 MG/3DAYS TD PT72: 1.0000 | MEDICATED_PATCH | TRANSDERMAL | 0 refills | Status: DC

## 2018-09-26 LAB — LIPID PANEL
CHOL/HDL RATIO: 2.6 ratio (ref 0.0–5.0)
CHOLESTEROL TOTAL: 212 mg/dL — AB (ref 100–199)
HDL: 83 mg/dL (ref >39)
LDL Calculated: 117 mg/dL — ABNORMAL HIGH (ref 0–99)
TRIGLYCERIDES: 59 mg/dL (ref 0–149)
VLDL Cholesterol Cal: 12 mg/dL (ref 5–40)

## 2018-09-26 LAB — COMPREHENSIVE METABOLIC PANEL
ALK PHOS: 49 IU/L (ref 39–117)
ALT: 31 IU/L (ref 0–44)
AST: 32 IU/L (ref 0–40)
Albumin/Globulin Ratio: 1.7 (ref 1.2–2.2)
Albumin: 4.6 g/dL (ref 3.5–5.5)
BILIRUBIN TOTAL: 0.4 mg/dL (ref 0.0–1.2)
BUN/Creatinine Ratio: 13 (ref 9–20)
BUN: 14 mg/dL (ref 6–24)
CHLORIDE: 100 mmol/L (ref 96–106)
CO2: 24 mmol/L (ref 20–29)
Calcium: 9.4 mg/dL (ref 8.7–10.2)
Creatinine, Ser: 1.06 mg/dL (ref 0.76–1.27)
GFR calc Af Amer: 95 mL/min/{1.73_m2} (ref >59)
GFR calc non Af Amer: 83 mL/min/{1.73_m2} (ref >59)
GLUCOSE: 97 mg/dL (ref 65–99)
Globulin, Total: 2.7 g/dL (ref 1.5–4.5)
Potassium: 4.3 mmol/L (ref 3.5–5.2)
Sodium: 140 mmol/L (ref 134–144)
TOTAL PROTEIN: 7.3 g/dL (ref 6.0–8.5)

## 2018-09-26 LAB — CBC WITH DIFFERENTIAL/PLATELET
BASOS ABS: 0.1 10*3/uL (ref 0.0–0.2)
Basos: 1 %
EOS (ABSOLUTE): 0.7 10*3/uL — ABNORMAL HIGH (ref 0.0–0.4)
Eos: 15 %
Hematocrit: 46.3 % (ref 37.5–51.0)
Hemoglobin: 16.1 g/dL (ref 13.0–17.7)
Immature Grans (Abs): 0 10*3/uL (ref 0.0–0.1)
Immature Granulocytes: 0 %
LYMPHS: 40 %
Lymphocytes Absolute: 1.9 10*3/uL (ref 0.7–3.1)
MCH: 30 pg (ref 26.6–33.0)
MCHC: 34.8 g/dL (ref 31.5–35.7)
MCV: 86 fL (ref 79–97)
MONOCYTES: 23 %
Monocytes Absolute: 1.1 10*3/uL — ABNORMAL HIGH (ref 0.1–0.9)
Neutrophils Absolute: 1 10*3/uL — ABNORMAL LOW (ref 1.4–7.0)
Neutrophils: 21 %
Platelets: 199 10*3/uL (ref 150–450)
RBC: 5.37 x10E6/uL (ref 4.14–5.80)
RDW: 12.3 % (ref 12.3–15.4)
WBC: 4.8 10*3/uL (ref 3.4–10.8)

## 2018-10-07 ENCOUNTER — Ambulatory Visit: Payer: 59

## 2018-10-11 ENCOUNTER — Ambulatory Visit (INDEPENDENT_AMBULATORY_CARE_PROVIDER_SITE_OTHER): Payer: 59

## 2018-10-11 DIAGNOSIS — D721 Eosinophilia, unspecified: Secondary | ICD-10-CM

## 2018-10-14 ENCOUNTER — Other Ambulatory Visit: Payer: Self-pay | Admitting: Internal Medicine

## 2018-10-14 MED ORDER — DUPILUMAB 300 MG/2ML ~~LOC~~ SOSY
300.0000 mg | PREFILLED_SYRINGE | Freq: Once | SUBCUTANEOUS | Status: AC
  Administered 2018-10-11: 300 mg via SUBCUTANEOUS

## 2018-10-14 NOTE — Progress Notes (Signed)
Documented by Mariamawit Depaoli CMA based on hand-written Dupixent documentation sheet completed by Tammy Scott CMA, who administered the medication.  

## 2018-10-23 ENCOUNTER — Telehealth: Payer: Self-pay | Admitting: Internal Medicine

## 2018-10-23 NOTE — Telephone Encounter (Signed)
1 prefilled syringe Ordered Date: 10/23/18 Shipping Date: 10/23/18

## 2018-10-24 NOTE — Telephone Encounter (Signed)
1 prefilled syringe Arrival Date: 10/24/18 Lot #:[id number] Exp date: 05/2020

## 2018-10-25 ENCOUNTER — Ambulatory Visit (INDEPENDENT_AMBULATORY_CARE_PROVIDER_SITE_OTHER): Payer: 59

## 2018-10-25 DIAGNOSIS — D721 Eosinophilia, unspecified: Secondary | ICD-10-CM

## 2018-10-25 MED ORDER — DUPILUMAB 300 MG/2ML ~~LOC~~ SOSY
300.0000 mg | PREFILLED_SYRINGE | Freq: Once | SUBCUTANEOUS | Status: AC
  Administered 2018-10-25: 300 mg via SUBCUTANEOUS

## 2018-10-25 NOTE — Progress Notes (Signed)
Documentation of medication administration and charges of Dupxient have been completed by Desmond Dike, CMA based on the hand written Dupxient documentation sheet completed by Alroy Bailiff, who administered the medication.

## 2018-11-08 ENCOUNTER — Ambulatory Visit (INDEPENDENT_AMBULATORY_CARE_PROVIDER_SITE_OTHER): Payer: 59

## 2018-11-08 DIAGNOSIS — D721 Eosinophilia, unspecified: Secondary | ICD-10-CM

## 2018-11-08 MED ORDER — DUPILUMAB 300 MG/2ML ~~LOC~~ SOSY
300.0000 mg | PREFILLED_SYRINGE | Freq: Once | SUBCUTANEOUS | Status: AC
  Administered 2018-11-08: 300 mg via SUBCUTANEOUS

## 2018-11-12 ENCOUNTER — Telehealth: Payer: Self-pay | Admitting: Internal Medicine

## 2018-11-12 MED ORDER — FLUTICASONE FUROATE-VILANTEROL 200-25 MCG/INH IN AEPB: 1.0000 | INHALATION_SPRAY | Freq: Every day | RESPIRATORY_TRACT | 5 refills | Status: DC

## 2018-11-12 NOTE — Telephone Encounter (Signed)
Rx sent to pharmacy electronically through [facility] and patient is aware. Nothing more needed at this time.

## 2018-11-19 ENCOUNTER — Telehealth: Payer: Self-pay | Admitting: Internal Medicine

## 2018-11-19 NOTE — Telephone Encounter (Signed)
Tried to order pt Dupixent, [facility] couldn't get in touch with pt.Marland Kitchen [facility] rep left pt a message. As soon as he calls them back they'll be able to ship his med.. I'll try and call him when I get off the ph. With the pharm.Marland Kitchen [facility] called pt, he gave his consent, his Dupixent will be here 11/22/18.  1 prefilled syringe Ordered Date: 11/19/18 Shipping Date: 11/22/18

## 2018-11-22 ENCOUNTER — Ambulatory Visit (INDEPENDENT_AMBULATORY_CARE_PROVIDER_SITE_OTHER): Payer: 59

## 2018-11-22 DIAGNOSIS — D721 Eosinophilia, unspecified: Secondary | ICD-10-CM

## 2018-11-22 MED ORDER — DUPILUMAB 300 MG/2ML ~~LOC~~ SOSY
300.0000 mg | PREFILLED_SYRINGE | Freq: Once | SUBCUTANEOUS | Status: AC
  Administered 2018-11-22: 300 mg via SUBCUTANEOUS

## 2018-11-22 NOTE — Telephone Encounter (Signed)
1 prefilled syringe Arrival Date: 1/3/120 Lot #:[id number] Exp date: 09/2020

## 2018-12-06 ENCOUNTER — Ambulatory Visit (INDEPENDENT_AMBULATORY_CARE_PROVIDER_SITE_OTHER): Payer: 59

## 2018-12-06 DIAGNOSIS — J4540 Moderate persistent asthma, uncomplicated: Secondary | ICD-10-CM | POA: Diagnosis not present

## 2018-12-11 MED ORDER — DUPILUMAB 300 MG/2ML ~~LOC~~ SOSY
300.0000 mg | PREFILLED_SYRINGE | Freq: Once | SUBCUTANEOUS | Status: AC
  Administered 2018-12-06: 300 mg via SUBCUTANEOUS

## 2018-12-20 ENCOUNTER — Ambulatory Visit (INDEPENDENT_AMBULATORY_CARE_PROVIDER_SITE_OTHER): Payer: 59

## 2018-12-20 DIAGNOSIS — J4540 Moderate persistent asthma, uncomplicated: Secondary | ICD-10-CM | POA: Diagnosis not present

## 2018-12-25 ENCOUNTER — Telehealth: Payer: Self-pay | Admitting: Internal Medicine

## 2018-12-25 MED ORDER — DUPILUMAB 300 MG/2ML ~~LOC~~ SOSY
300.0000 mg | PREFILLED_SYRINGE | Freq: Once | SUBCUTANEOUS | Status: AC
  Administered 2018-12-20: 300 mg via SUBCUTANEOUS

## 2018-12-25 NOTE — Telephone Encounter (Signed)
Called and spoke with [place], [place] . Merry Proud stated that it is time for a new Xolair prescription.  Will route message to Tammy S and Katie to follow up

## 2018-12-26 NOTE — Telephone Encounter (Signed)
Pt is not on Xolair. Pt is on Dupixent.;gave verbal rx and nothing more needed.   Dupixent 300mg /80ml #1 box (2 prefilled syringes) Inject 1 prefilled syringe every 15 days with 11 refills.   Nothing more needed at this time.

## 2018-12-30 ENCOUNTER — Telehealth: Payer: Self-pay | Admitting: Internal Medicine

## 2018-12-30 NOTE — Telephone Encounter (Signed)
1 prefilled syringe Ordered Date: 12/30/2018 Shipping Date: 12/31/2018

## 2019-01-02 NOTE — Telephone Encounter (Signed)
1 prefilled syringe Arrival Date: 01/02/2019 Lot #:[id number] Exp date: 09/2020

## 2019-01-03 ENCOUNTER — Ambulatory Visit (INDEPENDENT_AMBULATORY_CARE_PROVIDER_SITE_OTHER): Payer: 59

## 2019-01-03 DIAGNOSIS — J4540 Moderate persistent asthma, uncomplicated: Secondary | ICD-10-CM | POA: Diagnosis not present

## 2019-01-07 MED ORDER — DUPILUMAB 300 MG/2ML ~~LOC~~ SOSY
300.0000 mg | PREFILLED_SYRINGE | Freq: Once | SUBCUTANEOUS | Status: AC
  Administered 2019-01-03: 300 mg via SUBCUTANEOUS

## 2019-01-07 NOTE — Progress Notes (Signed)
Dupixent injection documentation and charges entered by Maury Dus, RMA, based on injection sheet filled out by [facility] during preparation and administration. This documentation process is due to office requirements.

## 2019-01-08 DIAGNOSIS — L814 Other melanin hyperpigmentation: Secondary | ICD-10-CM | POA: Diagnosis not present

## 2019-01-08 DIAGNOSIS — D229 Melanocytic nevi, unspecified: Secondary | ICD-10-CM | POA: Diagnosis not present

## 2019-01-17 ENCOUNTER — Ambulatory Visit (INDEPENDENT_AMBULATORY_CARE_PROVIDER_SITE_OTHER): Payer: 59

## 2019-01-17 DIAGNOSIS — J4540 Moderate persistent asthma, uncomplicated: Secondary | ICD-10-CM | POA: Diagnosis not present

## 2019-01-20 MED ORDER — DUPILUMAB 300 MG/2ML ~~LOC~~ SOSY
300.0000 mg | PREFILLED_SYRINGE | Freq: Once | SUBCUTANEOUS | Status: AC
  Administered 2019-01-17: 300 mg via SUBCUTANEOUS

## 2019-01-23 ENCOUNTER — Encounter: Payer: 59 | Admitting: Family Medicine

## 2019-01-24 ENCOUNTER — Telehealth: Payer: Self-pay | Admitting: Internal Medicine

## 2019-01-24 NOTE — Telephone Encounter (Signed)
1 prefilled syringe Arrival Date: 01/24/2019 Lot #:[id number] Exp date: 11/2020

## 2019-01-31 ENCOUNTER — Other Ambulatory Visit: Payer: Self-pay

## 2019-01-31 ENCOUNTER — Ambulatory Visit (INDEPENDENT_AMBULATORY_CARE_PROVIDER_SITE_OTHER): Payer: 59

## 2019-01-31 DIAGNOSIS — J4540 Moderate persistent asthma, uncomplicated: Secondary | ICD-10-CM

## 2019-02-03 MED ORDER — DUPILUMAB 300 MG/2ML ~~LOC~~ SOSY
300.0000 mg | PREFILLED_SYRINGE | Freq: Once | SUBCUTANEOUS | Status: AC
  Administered 2019-01-31: 300 mg via SUBCUTANEOUS

## 2019-02-11 ENCOUNTER — Telehealth: Payer: Self-pay | Admitting: Internal Medicine

## 2019-02-11 NOTE — Telephone Encounter (Signed)
The office received a medication delivery for this patient's generic Singulair. I have called and left message a vm for the patient to advis of this. He has an injection scheduled for 02/14/2019 - left medicine up front with our samples for patient to get when he comes on Friday - pr

## 2019-02-14 ENCOUNTER — Ambulatory Visit (INDEPENDENT_AMBULATORY_CARE_PROVIDER_SITE_OTHER): Payer: 59

## 2019-02-14 DIAGNOSIS — J4540 Moderate persistent asthma, uncomplicated: Secondary | ICD-10-CM | POA: Diagnosis not present

## 2019-02-14 MED ORDER — DUPILUMAB 300 MG/2ML ~~LOC~~ SOSY
300.0000 mg | PREFILLED_SYRINGE | Freq: Once | SUBCUTANEOUS | Status: AC
  Administered 2019-02-14: 300 mg via SUBCUTANEOUS

## 2019-02-25 ENCOUNTER — Telehealth: Payer: Self-pay | Admitting: Internal Medicine

## 2019-02-25 NOTE — Telephone Encounter (Signed)
1 prefilled syringe Ordered Date: 02/25/2019 Shipping Date: 02/26/2019

## 2019-02-27 NOTE — Telephone Encounter (Signed)
1 prefilled syringe Arrival Date: 02/27/2019 Lot #:[id number] Exp date: 12/2020

## 2019-03-03 ENCOUNTER — Ambulatory Visit (INDEPENDENT_AMBULATORY_CARE_PROVIDER_SITE_OTHER): Payer: 59

## 2019-03-03 ENCOUNTER — Other Ambulatory Visit: Payer: Self-pay

## 2019-03-03 DIAGNOSIS — J4550 Severe persistent asthma, uncomplicated: Secondary | ICD-10-CM

## 2019-03-03 MED ORDER — DUPILUMAB 300 MG/2ML ~~LOC~~ SOSY
300.0000 mg | PREFILLED_SYRINGE | Freq: Once | SUBCUTANEOUS | Status: AC
  Administered 2019-03-03: 09:00:00 300 mg via SUBCUTANEOUS

## 2019-03-17 ENCOUNTER — Ambulatory Visit (INDEPENDENT_AMBULATORY_CARE_PROVIDER_SITE_OTHER): Payer: 59

## 2019-03-17 ENCOUNTER — Other Ambulatory Visit: Payer: Self-pay

## 2019-03-17 DIAGNOSIS — J4550 Severe persistent asthma, uncomplicated: Secondary | ICD-10-CM

## 2019-03-17 MED ORDER — DUPILUMAB 300 MG/2ML ~~LOC~~ SOSY
300.0000 mg | PREFILLED_SYRINGE | Freq: Once | SUBCUTANEOUS | Status: AC
  Administered 2019-03-17: 09:00:00 300 mg via SUBCUTANEOUS

## 2019-03-17 NOTE — Progress Notes (Signed)
Have you been hospitalized within the last 10 days?  No Do you have a fever?  No Do you have a cough?  No Do you have a headache or sore throat? No  

## 2019-03-24 ENCOUNTER — Telehealth: Payer: Self-pay | Admitting: Internal Medicine

## 2019-03-24 NOTE — Telephone Encounter (Signed)
Dupixent Order: 300mg  #1 prefilled syringe Ordered Date: 03/24/2019 Expected date of arrival: 03/26/2019 Ordered by: Desmond Dike, [place] Pharmacy: [facility]

## 2019-03-26 NOTE — Telephone Encounter (Signed)
Dupixent Shipment Received: 300mg  #1 prefilled syringe Medication arrival date: 03/26/2019 Lot #: [id number] Medication exp date: 08/2021 Received by: Desmond Dike, CMA

## 2019-03-27 ENCOUNTER — Encounter: Payer: 59 | Admitting: Family Medicine

## 2019-03-31 ENCOUNTER — Other Ambulatory Visit: Payer: Self-pay

## 2019-03-31 ENCOUNTER — Ambulatory Visit (INDEPENDENT_AMBULATORY_CARE_PROVIDER_SITE_OTHER): Payer: 59

## 2019-03-31 DIAGNOSIS — J4550 Severe persistent asthma, uncomplicated: Secondary | ICD-10-CM | POA: Diagnosis not present

## 2019-03-31 MED ORDER — DUPILUMAB 300 MG/2ML ~~LOC~~ SOSY
300.0000 mg | PREFILLED_SYRINGE | Freq: Once | SUBCUTANEOUS | Status: AC
  Administered 2019-03-31: 10:00:00 300 mg via SUBCUTANEOUS

## 2019-03-31 NOTE — Progress Notes (Signed)
Have you been hospitalized within the last 10 days?  No Do you have a fever?  No Do you have a cough?  No Do you have a headache or sore throat? No  

## 2019-04-02 ENCOUNTER — Other Ambulatory Visit: Payer: Self-pay | Admitting: Internal Medicine

## 2019-04-21 ENCOUNTER — Ambulatory Visit: Payer: 59

## 2019-04-24 ENCOUNTER — Ambulatory Visit: Payer: 59

## 2019-05-07 ENCOUNTER — Telehealth: Payer: Self-pay | Admitting: Internal Medicine

## 2019-05-07 NOTE — Telephone Encounter (Signed)
Medication - Singulair was delivered to the office yesterday. Called and spoke to patient advised that it will be in the samples box, and when he comes for his injection on 05/09/2019 - he will pick this medicine up- pr

## 2019-05-09 ENCOUNTER — Other Ambulatory Visit: Payer: Self-pay

## 2019-05-09 ENCOUNTER — Ambulatory Visit (INDEPENDENT_AMBULATORY_CARE_PROVIDER_SITE_OTHER): Payer: 59

## 2019-05-09 DIAGNOSIS — J4550 Severe persistent asthma, uncomplicated: Secondary | ICD-10-CM | POA: Diagnosis not present

## 2019-05-09 MED ORDER — DUPILUMAB 300 MG/2ML ~~LOC~~ SOSY
300.0000 mg | PREFILLED_SYRINGE | Freq: Once | SUBCUTANEOUS | Status: AC
  Administered 2019-05-09: 300 mg via SUBCUTANEOUS

## 2019-05-09 NOTE — Progress Notes (Signed)
Have you been hospitalized within the last 10 days?  No Do you have a fever?  No Do you have a cough?  No Do you have a headache or sore throat? No  

## 2019-05-17 ENCOUNTER — Other Ambulatory Visit: Payer: Self-pay | Admitting: Internal Medicine

## 2019-05-21 ENCOUNTER — Telehealth: Payer: Self-pay | Admitting: Internal Medicine

## 2019-05-21 NOTE — Telephone Encounter (Addendum)
Called [facility] to set up shipment for [place]. Spoke with Webb Silversmith and was advised that they needed the pt's consent before shipment could be set up. She left a message with the pt to call them back and give consent. Will follow up on this matter next week.

## 2019-05-22 NOTE — Telephone Encounter (Signed)
[facility] is calling to set up shipment for [place]. Cb is [phone].

## 2019-05-26 NOTE — Telephone Encounter (Signed)
Dupixent Order: 300mg  #1 prefilled syringe Ordered Date: 05/26/2019 Expected date of arrival: 05/28/2019 Ordered by: Desmond Dike, [place]: Claria Dice

## 2019-05-28 NOTE — Telephone Encounter (Signed)
Dupixent Shipment Received: 300mg  #2 prefilled syringe Medication arrival date: 05/28/2019 Lot #: [id number] Exp date: 08/2021 Received by: Laurette Schimke

## 2019-05-30 ENCOUNTER — Ambulatory Visit (INDEPENDENT_AMBULATORY_CARE_PROVIDER_SITE_OTHER): Payer: 59

## 2019-05-30 ENCOUNTER — Other Ambulatory Visit: Payer: Self-pay

## 2019-05-30 DIAGNOSIS — J4550 Severe persistent asthma, uncomplicated: Secondary | ICD-10-CM

## 2019-05-30 MED ORDER — DUPILUMAB 300 MG/2ML ~~LOC~~ SOSY
300.0000 mg | PREFILLED_SYRINGE | Freq: Once | SUBCUTANEOUS | Status: AC
  Administered 2019-05-30: 10:00:00 300 mg via SUBCUTANEOUS

## 2019-05-30 NOTE — Progress Notes (Signed)
Have you been hospitalized within the last 10 days?  No Do you have a fever?  No Do you have a cough?  No Do you have a headache or sore throat? No  

## 2019-06-06 ENCOUNTER — Telehealth: Payer: Self-pay | Admitting: *Deleted

## 2019-06-06 NOTE — Telephone Encounter (Signed)
Routing this message to the injection pool for follow-up.

## 2019-06-09 NOTE — Telephone Encounter (Signed)
Seth Kerr, is there any way you might be able to get this taken care of since [name] is out of office this week?

## 2019-06-10 NOTE — Telephone Encounter (Signed)
Returned call to patient.  Patient states he received a call from [place] requesting he come in at 0930 due to needing to warm medication before arrival.  Patient will arrive about 0920 for appt on 06/13/19.  Patient acknowledged understanding.  Nothing further needed at this time.

## 2019-06-13 ENCOUNTER — Ambulatory Visit (INDEPENDENT_AMBULATORY_CARE_PROVIDER_SITE_OTHER): Payer: 59

## 2019-06-13 ENCOUNTER — Other Ambulatory Visit: Payer: Self-pay

## 2019-06-13 DIAGNOSIS — J4550 Severe persistent asthma, uncomplicated: Secondary | ICD-10-CM

## 2019-06-13 MED ORDER — DUPILUMAB 300 MG/2ML ~~LOC~~ SOSY
300.0000 mg | PREFILLED_SYRINGE | Freq: Once | SUBCUTANEOUS | Status: AC
  Administered 2019-06-13: 300 mg via SUBCUTANEOUS

## 2019-06-13 NOTE — Progress Notes (Signed)
All questions were answered by the patient before medication was administered. Have you been hospitalized in the last 10 days? No Do you have a fever? No Do you have a cough? No Do you have a headache or sore throat? No   Denies any problems from last injection.

## 2019-06-17 ENCOUNTER — Telehealth: Payer: Self-pay | Admitting: Internal Medicine

## 2019-06-17 NOTE — Telephone Encounter (Signed)
Dupixent Order: 300mg  #2 prefilled syringe Ordered Date: 06/17/2019 Expected date of arrival: 06/19/2019 Ordered by: Desmond Dike, [place]: Claria Dice

## 2019-06-19 NOTE — Telephone Encounter (Signed)
Dupixent Shipment Received: 300mg  #2 prefilled syringe Medication arrival date: 06/19/19  Lot #: [id number] Exp date: 09/20/2021 Received by: Parke Poisson, CMA

## 2019-06-27 ENCOUNTER — Ambulatory Visit (INDEPENDENT_AMBULATORY_CARE_PROVIDER_SITE_OTHER): Payer: 59

## 2019-06-27 ENCOUNTER — Other Ambulatory Visit: Payer: Self-pay

## 2019-06-27 DIAGNOSIS — J4550 Severe persistent asthma, uncomplicated: Secondary | ICD-10-CM | POA: Diagnosis not present

## 2019-06-27 MED ORDER — DUPILUMAB 300 MG/2ML ~~LOC~~ SOSY
300.0000 mg | PREFILLED_SYRINGE | Freq: Once | SUBCUTANEOUS | Status: AC
  Administered 2019-06-27: 14:00:00 300 mg via SUBCUTANEOUS

## 2019-06-27 NOTE — Progress Notes (Signed)
Have you been hospitalized within the last 10 days?  No Do you have a fever?  No Do you have a cough?  No Do you have a headache or sore throat? No Do you have your Epi Pen visible and is it within date?  Yes   Patient brought his Seth Kerr to the office today - expiration date was 7.8.2020.  Advised patient will send in refills for him and reminded him to bring his AuviQ to each injection.  Patient voiced his understanding.

## 2019-07-10 ENCOUNTER — Telehealth: Payer: Self-pay

## 2019-07-10 MED ORDER — EPIPEN 2-PAK 0.3 MG/0.3ML IJ SOAJ: INTRAMUSCULAR | 0 refills | Status: DC

## 2019-07-10 NOTE — Telephone Encounter (Signed)
Called to screen pt for injection appointment for 07/11/19. Instructed to bring Epipen to appointment at which time pt requested new [facility] rx [facility] [address] due to his current Epipen being expired. Pt advised prescription has been sent. No further actions necessary at this time.

## 2019-07-11 ENCOUNTER — Ambulatory Visit: Payer: 59

## 2019-07-14 ENCOUNTER — Other Ambulatory Visit: Payer: Self-pay

## 2019-07-14 ENCOUNTER — Ambulatory Visit (INDEPENDENT_AMBULATORY_CARE_PROVIDER_SITE_OTHER): Payer: 59

## 2019-07-14 DIAGNOSIS — J4550 Severe persistent asthma, uncomplicated: Secondary | ICD-10-CM

## 2019-07-14 MED ORDER — EPIPEN 2-PAK 0.3 MG/0.3ML IJ SOAJ: INTRAMUSCULAR | 0 refills | Status: DC

## 2019-07-14 MED ORDER — DUPILUMAB 300 MG/2ML ~~LOC~~ SOSY
300.0000 mg | PREFILLED_SYRINGE | Freq: Once | SUBCUTANEOUS | Status: AC
  Administered 2019-07-14: 300 mg via SUBCUTANEOUS

## 2019-07-14 NOTE — Progress Notes (Signed)
All questions were answered by the patient before medication was administered. Have you been hospitalized in the last 10 days? No Do you have a fever? No Do you have a cough? No Do you have a headache or sore throat? No  

## 2019-07-21 ENCOUNTER — Telehealth: Payer: Self-pay | Admitting: Internal Medicine

## 2019-07-21 NOTE — Telephone Encounter (Signed)
Dupixent Order: 300mg  #2 prefilled syringe Ordered Date: 07/21/19 Expected date of arrival: 07/24/2019 Ordered by: [facility]: [facility]  Order # [id number]

## 2019-07-24 ENCOUNTER — Other Ambulatory Visit: Payer: Self-pay | Admitting: Internal Medicine

## 2019-07-24 NOTE — Telephone Encounter (Signed)
Dupixent Shipment Received: 300mg  #2 prefilled syringe Medication arrival date: 07/24/2019 Lot #: [id number] Exp date: 11/2021 Received by: Desmond Dike, [place]

## 2019-08-01 ENCOUNTER — Ambulatory Visit (INDEPENDENT_AMBULATORY_CARE_PROVIDER_SITE_OTHER): Payer: 59

## 2019-08-01 ENCOUNTER — Telehealth: Payer: Self-pay | Admitting: Internal Medicine

## 2019-08-01 ENCOUNTER — Other Ambulatory Visit: Payer: Self-pay

## 2019-08-01 DIAGNOSIS — J4550 Severe persistent asthma, uncomplicated: Secondary | ICD-10-CM

## 2019-08-01 MED ORDER — DUPILUMAB 300 MG/2ML ~~LOC~~ SOSY
300.0000 mg | PREFILLED_SYRINGE | Freq: Once | SUBCUTANEOUS | Status: AC
  Administered 2019-08-01: 300 mg via SUBCUTANEOUS

## 2019-08-01 NOTE — Progress Notes (Signed)
Have you been hospitalized within the last 10 days?  No Do you have a fever?  No Do you have a cough?  No Do you have a headache or sore throat? No  

## 2019-08-01 NOTE — Telephone Encounter (Signed)
[facility] delivered Patient's Montelukast to [facility] office today.  Patient has been notified that his prescription was mailed to our office and not his home.   Patient stated he would pick up his med next week or at his next injection schedule. Patient's Montelukast is in sealed bag, placed in locked cabinet, in injection room for Patient to pick up.  Message routed to injection pool as [place]

## 2019-08-12 ENCOUNTER — Other Ambulatory Visit: Payer: Self-pay | Admitting: Internal Medicine

## 2019-08-12 MED ORDER — BREO ELLIPTA 200-25 MCG/INH IN AEPB: 1.0000 | INHALATION_SPRAY | Freq: Every day | RESPIRATORY_TRACT | 5 refills | Status: DC

## 2019-08-12 NOTE — Telephone Encounter (Signed)
Received faxed refill [facility] from [facility] [address]  Medication name/strength/dose: Breo 200 Medication last rx'd: 6.29.2020 Quantity and number of refills last rx'd: #1 with 1 refill  Patient last seen in the office on 6.17.19 w/ MR  Does refill need to be authorized by a provider? no Refill authorized (yes or no)?: Yes, called spoke with patient and appt scheduled for this coming Friday with Beth NP at 1000 after his Dupixent injection.  Nothing further needed; will sign off.

## 2019-08-15 ENCOUNTER — Encounter: Payer: Self-pay | Admitting: Primary Care

## 2019-08-15 ENCOUNTER — Ambulatory Visit (INDEPENDENT_AMBULATORY_CARE_PROVIDER_SITE_OTHER): Payer: 59 | Admitting: Primary Care

## 2019-08-15 ENCOUNTER — Ambulatory Visit (INDEPENDENT_AMBULATORY_CARE_PROVIDER_SITE_OTHER): Payer: 59

## 2019-08-15 ENCOUNTER — Other Ambulatory Visit: Payer: Self-pay

## 2019-08-15 VITALS — BP 122/70 | HR 54 | Temp 97.0°F | Ht 70.0 in | Wt 185.0 lb

## 2019-08-15 DIAGNOSIS — J4550 Severe persistent asthma, uncomplicated: Secondary | ICD-10-CM

## 2019-08-15 DIAGNOSIS — J4541 Moderate persistent asthma with (acute) exacerbation: Secondary | ICD-10-CM

## 2019-08-15 LAB — CBC WITH DIFFERENTIAL/PLATELET
Basophils Absolute: 0.1 10*3/uL (ref 0.0–0.1)
Basophils Relative: 0.9 % (ref 0.0–3.0)
Eosinophils Absolute: 0.9 10*3/uL — ABNORMAL HIGH (ref 0.0–0.7)
Eosinophils Relative: 14.7 % — ABNORMAL HIGH (ref 0.0–5.0)
HCT: 45.5 % (ref 39.0–52.0)
Hemoglobin: 15 g/dL (ref 13.0–17.0)
Lymphocytes Relative: 32.7 % (ref 12.0–46.0)
Lymphs Abs: 2 10*3/uL (ref 0.7–4.0)
MCHC: 33.1 g/dL (ref 30.0–36.0)
MCV: 92.1 fl (ref 78.0–100.0)
Monocytes Absolute: 0.8 10*3/uL (ref 0.1–1.0)
Monocytes Relative: 13.7 % — ABNORMAL HIGH (ref 3.0–12.0)
Neutro Abs: 2.4 10*3/uL (ref 1.4–7.7)
Neutrophils Relative %: 38 % — ABNORMAL LOW (ref 43.0–77.0)
Platelets: 241 10*3/uL (ref 150.0–400.0)
RBC: 4.94 Mil/uL (ref 4.22–5.81)
RDW: 13 % (ref 11.5–15.5)
WBC: 6.2 10*3/uL (ref 4.0–10.5)

## 2019-08-15 MED ORDER — DUPILUMAB 300 MG/2ML ~~LOC~~ SOSY
300.0000 mg | PREFILLED_SYRINGE | Freq: Once | SUBCUTANEOUS | Status: AC
  Administered 2019-08-15: 300 mg via SUBCUTANEOUS

## 2019-08-15 MED ORDER — PROAIR RESPICLICK 108 (90 BASE) MCG/ACT IN AEPB: 2.0000 | INHALATION_SPRAY | RESPIRATORY_TRACT | 5 refills | Status: DC | PRN

## 2019-08-15 MED ORDER — MONTELUKAST SODIUM 10 MG PO TABS: 10.0000 mg | ORAL_TABLET | Freq: Every day | ORAL | 2 refills | Status: DC

## 2019-08-15 NOTE — Progress Notes (Signed)
@Patient  ID: Seth Kerr, male    DOB: [birth year], 48 y.o.   MRN: [id number]  Chief Complaint  Patient presents with  . Follow-up    yearly follow up for Breo    Referring provider: Rita Ohara, MD  HPI: 48 year old male, never smoked.  Past medical history significant for moderate persistent asthma, allergic rhinitis, elevated IgE level and eosinophilia.  Patient of Dr. Chase Caller, last seen 04/26/2018.  Maintained on Dupixent 300mg  q14 days, Breo Ellipta 200, Singulair. PFTs in 2017 showed normal lung function, no significant BD response and normal diffusion capacity.   08/15/2019 Patient presents today for annual follow-up for moderate persistent asthma. He is doing very well, no recent exacerbations. Continues to receive Dupixent injections every 2 weeks. Using Breo 2 puffs daily. He has not required his rescue inhaler in months. No active symptoms. Denies sob, wheezing or cough.    PFTs 2017- FVC 4.54 (88%), FEV1 3.76 (93), ratio 83, DLCO 129% Normal lung function, no significant BD response. Positive mid-flow reversibility. Normal diffusion capacity  Previous spirometry had showed significant airflow obstructions with FEV1 ranging from 35-53%  Labs  FENO is 87  - 07/27/16 IgE 254; eosinophil absolute 1,900 09/2018- Eos 700  Imaging 92017 CT chest done polylobar areas of scarring likely from previous pna, mild nodularity along lingula ? Scarring. No ILD changes.   Allergies  Allergen Reactions  . Aspirin Other (See Comments)    Not actual allergy, had chronic nosebleeds as a child and was instructed not to use aspirin.    Immunization History  Administered Date(s) Administered  . Influenza Split 09/12/2012, 08/29/2014, 09/05/2016  . Influenza,inj,Quad PF,6+ Mos 10/09/2013, 09/03/2015, 08/29/2017, 08/28/2018  . Pneumococcal Polysaccharide-23 05/05/2015  . Tdap 10/04/2010, 04/22/2014    Past Medical History:  Diagnosis Date  . Allergic rhinitis, cause unspecified    immunotherapy x 32yrs (stopped end of 2015), per Dr. Donneta Romberg  . Asthma   . Erectile dysfunction   . Irritable bowel syndrome (IBS)    has had 2 colonoscopies  . Onychomycosis    s/p lamisil x 2 courses    Tobacco History: Social History   Tobacco Use  Smoking Status Never Smoker  Smokeless Tobacco Never Used   Counseling given: Not Answered   Outpatient Medications Prior to Visit  Medication Sig Dispense Refill  . albuterol (PROVENTIL) (2.5 MG/3ML) 0.083% nebulizer solution Take 3 mLs (2.5 mg total) by nebulization every 6 (six) hours as needed for wheezing or shortness of breath. 75 mL 3  . dupilumab (DUPIXENT) 300 MG/2ML prefilled syringe Inject 300 mg into the skin every 14 (fourteen) days.    Marland Kitchen EPIPEN 2-PAK 0.3 MG/0.3ML SOAJ injection Use as directed 1 each 0  . fluticasone furoate-vilanterol (BREO ELLIPTA) 200-25 MCG/INH AEPB Inhale 1 puff into the lungs daily. 1 each 5  . mometasone (NASONEX) 50 MCG/ACT nasal spray Place 2 sprays into the nose 2 (two) times daily. Reported on 05/17/2016 17 g 4  . montelukast (SINGULAIR) 10 MG tablet TAKE 1 TABLET BY MOUTH AT  BEDTIME 90 tablet 1  . PROAIR RESPICLICK 123XX123 (90 Base) MCG/ACT AEPB Inhale 2 puffs into the lungs every 4 (four) hours as needed. 1 each 5  . scopolamine (TRANSDERM-SCOP, 1.5 MG,) 1 MG/3DAYS Place 1 patch (1.5 mg total) onto the skin every 3 (three) days. (Patient not taking: Reported on 08/15/2019) 4 patch 0  . Mepolizumab SOLR 100 mg     . Mepolizumab SOLR 100 mg     . Mepolizumab SOLR  100 mg     . Mepolizumab SOLR 100 mg     . Mepolizumab SOLR 100 mg      No facility-administered medications prior to visit.     Review of Systems  Review of Systems  Constitutional: Negative.   HENT: Negative.   Respiratory: Negative for cough, shortness of breath and wheezing.   Cardiovascular: Negative.     Physical Exam  BP 122/70 (BP Location: Left Arm, Cuff Size: Normal)   Pulse (!) 54   Temp (!) 97 F (36.1 C)  (Temporal)   Ht 5\' 10"  (1.778 m)   Wt 185 lb (83.9 kg)   SpO2 98%   BMI 26.54 kg/m  Physical Exam Constitutional:      Appearance: Normal appearance.  HENT:     Head: Normocephalic and atraumatic.     Mouth/Throat:     Mouth: Mucous membranes are moist.     Pharynx: Oropharynx is clear.  Neck:     Musculoskeletal: Normal range of motion.  Cardiovascular:     Rate and Rhythm: Regular rhythm. Bradycardia present.  Pulmonary:     Effort: Pulmonary effort is normal.     Breath sounds: Normal breath sounds. No wheezing or rhonchi.  Musculoskeletal: Normal range of motion.  Skin:    General: Skin is warm and dry.  Neurological:     General: No focal deficit present.     Mental Status: He is alert and oriented to person, place, and time. Mental status is at baseline.  Psychiatric:        Mood and Affect: Mood normal.        Behavior: Behavior normal.        Thought Content: Thought content normal.        Judgment: Judgment normal.      Lab Results:  CBC    Component Value Date/Time   WBC 4.8 09/25/2018 0930   WBC 4.8 02/28/2017 0933   RBC 5.37 09/25/2018 0930   RBC 5.15 02/28/2017 0933   HGB 16.1 09/25/2018 0930   HCT 46.3 09/25/2018 0930   PLT 199 09/25/2018 0930   MCV 86 09/25/2018 0930   MCH 30.0 09/25/2018 0930   MCH 30.1 05/17/2016 0943   MCHC 34.8 09/25/2018 0930   MCHC 34.2 02/28/2017 0933   RDW 12.3 09/25/2018 0930   LYMPHSABS 1.9 09/25/2018 0930   MONOABS 0.8 02/28/2017 0933   EOSABS 0.7 (H) 09/25/2018 0930   BASOSABS 0.1 09/25/2018 0930    BMET    Component Value Date/Time   NA 140 09/25/2018 0930   K 4.3 09/25/2018 0930   CL 100 09/25/2018 0930   CO2 24 09/25/2018 0930   GLUCOSE 97 09/25/2018 0930   GLUCOSE 97 05/17/2016 0943   BUN 14 09/25/2018 0930   CREATININE 1.06 09/25/2018 0930   CREATININE 0.99 05/17/2016 0943   CALCIUM 9.4 09/25/2018 0930   GFRNONAA 83 09/25/2018 0930   GFRAA 95 09/25/2018 0930    BNP No results found for: BNP   ProBNP No results found for: PROBNP  Imaging: No results found.   Assessment & Plan:   Moderate persistent asthma with acute exacerbation - Stable interval; doing well, no daily symptoms. Rare SABA use.  - Continue Dupixent injections 300mg  q14 days - Continue Breo 200 two puffs daily - Continue Albuterol hfa q4-6 hr PRN sob/wheezing (refill sent) - Continue Singulair 10mg  daily  - EPIPEN on hand/take as directed  - Checking CBC with Diff (eos in Novemebr 700), unable to  check FENO today d/t covid restrictions  - FU in 1 year with Dr. Chase Caller or sooner if needed      Martyn Ehrich, NP 08/15/2019

## 2019-08-15 NOTE — Patient Instructions (Addendum)
It was a pleasure meeting you today, glad you are doing well  Asthma: - Continue Breo 2 puffs once daily (refill already sent) - Continue Singulair 10mg  at bedtime (due for refill December 2020) - Continue Albuterol rescue inhaler 2 puffs every 4-6 hours as needed for breakthrough sob/wheezing (refill sent) - Continue Dupixent injections 300mg  every 2 weeks   Orders: - CBC with diff today - Unable to do FENO d/t COVID restrictions (recommend at next visit)  Follow-up: - 1 year with Dr. Chase Caller  - Recommend flu shot if you have not already had in early to mid-October    Asthma, Adult  Asthma is a long-term (chronic) condition that causes recurrent episodes in which the airways become tight and narrow. The airways are the passages that lead from the nose and mouth down into the lungs. Asthma episodes, also called asthma attacks, can cause coughing, wheezing, shortness of breath, and chest pain. The airways can also fill with mucus. During an attack, it can be difficult to breathe. Asthma attacks can range from minor to life threatening. Asthma cannot be cured, but medicines and lifestyle changes can help control it and treat acute attacks. What are the causes? This condition is believed to be caused by inherited (genetic) and environmental factors, but its exact cause is not known. There are many things that can bring on an asthma attack or make asthma symptoms worse (triggers). Asthma triggers are different for each person. Common triggers include:  Mold.  Dust.  Cigarette smoke.  Cockroaches.  Things that can cause allergy symptoms (allergens), such as animal dander or pollen from trees or grass.  Air pollutants such as household cleaners, wood smoke, smog, or Advertising account planner.  Cold air, weather changes, and winds (which increase molds and pollen in the air).  Strong emotional expressions such as crying or laughing hard.  Stress.  Certain medicines (such as aspirin) or  types of medicines (such as beta-blockers).  Sulfites in foods and drinks. Foods and drinks that may contain sulfites include dried fruit, potato chips, and sparkling grape juice.  Infections or inflammatory conditions such as the flu, a cold, or inflammation of the nasal membranes (rhinitis).  Gastroesophageal reflux disease (GERD).  Exercise or strenuous activity. What are the signs or symptoms? Symptoms of this condition may occur right after asthma is triggered or many hours later. Symptoms include:  Wheezing. This can sound like whistling when you breathe.  Excessive nighttime or early morning coughing.  Frequent or severe coughing with a common cold.  Chest tightness.  Shortness of breath.  Tiredness (fatigue) with minimal activity. How is this diagnosed? This condition is diagnosed based on:  Your medical history.  A physical exam.  Tests, which may include: ? Lung function studies and pulmonary studies (spirometry). These tests can evaluate the flow of air in your lungs. ? Allergy tests. ? Imaging tests, such as X-rays. How is this treated? There is no cure for this condition, but treatment can help control your symptoms. Treatment for asthma usually involves:  Identifying and avoiding your asthma triggers.  Using medicines to control your symptoms. Generally, two types of medicines are used to treat asthma: ? Controller medicines. These help prevent asthma symptoms from occurring. They are usually taken every day. ? Fast-acting reliever or rescue medicines. These quickly relieve asthma symptoms by widening the narrow and tight airways. They are used as needed and provide short-term relief.  Using supplemental oxygen. This may be needed during a severe episode.  Using  other medicines, such as: ? Allergy medicines, such as antihistamines, if your asthma attacks are triggered by allergens. ? Immune medicines (immunomodulators). These are medicines that help  control the immune system.  Creating an asthma action plan. An asthma action plan is a written plan for managing and treating your asthma attacks. This plan includes: ? A list of your asthma triggers and how to avoid them. ? Information about when medicines should be taken and when their dosage should be changed. ? Instructions about using a device called a peak flow meter. A peak flow meter measures how well the lungs are working and the severity of your asthma. It helps you monitor your condition. Follow these instructions at home: Controlling your home environment Control your home environment in the following ways to help avoid triggers and prevent asthma attacks:  Change your heating and air conditioning filter regularly.  Limit your use of fireplaces and wood stoves.  Get rid of pests (such as roaches and mice) and their droppings.  Throw away plants if you see mold on them.  Clean floors and dust surfaces regularly. Use unscented cleaning products.  Try to have someone else vacuum for you regularly. Stay out of rooms while they are being vacuumed and for a short while afterward. If you vacuum, use a dust mask from a hardware store, a double-layered or microfilter vacuum cleaner bag, or a vacuum cleaner with a HEPA filter.  Replace carpet with wood, tile, or vinyl flooring. Carpet can trap dander and dust.  Use allergy-proof pillows, mattress covers, and box spring covers.  Keep your bedroom a trigger-free room.  Avoid pets and keep windows closed when allergens are in the air.  Wash beddings every week in hot water and dry them in a dryer.  Use blankets that are made of polyester or cotton.  Clean bathrooms and kitchens with bleach. If possible, have someone repaint the walls in these rooms with mold-resistant paint. Stay out of the rooms that are being cleaned and painted.  Wash your hands often with soap and water. If soap and water are not available, use hand sanitizer.   Do not allow anyone to smoke in your home. General instructions  Take over-the-counter and prescription medicines only as told by your health care provider. ? Speak with your health care provider if you have questions about how or when to take the medicines. ? Make note if you are requiring more frequent dosages.  Do not use any products that contain nicotine or tobacco, such as cigarettes and e-cigarettes. If you need help quitting, ask your health care provider. Also, avoid being exposed to secondhand smoke.  Use a peak flow meter as told by your health care provider. Record and keep track of the readings.  Understand and use the asthma action plan to help minimize, or stop an asthma attack, without needing to seek medical care.  Make sure you stay up to date on your yearly vaccinations as told by your health care provider. This may include vaccines for the flu and pneumonia.  Avoid outdoor activities when allergen counts are high and when air quality is low.  Wear a ski mask that covers your nose and mouth during outdoor winter activities. Exercise indoors on cold days if you can.  Warm up before exercising, and take time for a cool-down period after exercise.  Keep all follow-up visits as told by your health care provider. This is important. Where to find more information  For information about asthma,  turn to the Centers for Disease Control and Prevention at http://www.clark.net/.htm  For air quality information, turn to AirNow at WeightRating.nl Contact a health care provider if:  You have wheezing, shortness of breath, or a cough even while you are taking medicine to prevent attacks.  The mucus you cough up (sputum) is thicker than usual.  Your sputum changes from clear or white to yellow, green, gray, or bloody.  Your medicines are causing side effects, such as a rash, itching, swelling, or trouble breathing.  You need to use a reliever medicine more than 2-3  times a week.  Your peak flow reading is still at 50-79% of your personal best after following your action plan for 1 hour.  You have a fever. Get help right away if:  You are getting worse and do not respond to treatment during an asthma attack.  You are short of breath when at rest or when doing very little physical activity.  You have difficulty eating, drinking, or talking.  You have chest pain or tightness.  You develop a fast heartbeat or palpitations.  You have a bluish color to your lips or fingernails.  You are light-headed or dizzy, or you faint.  Your peak flow reading is less than 50% of your personal best.  You feel too tired to breathe normally. Summary  Asthma is a long-term (chronic) condition that causes recurrent episodes in which the airways become tight and narrow. These episodes can cause coughing, wheezing, shortness of breath, and chest pain.  Asthma cannot be cured, but medicines and lifestyle changes can help control it and treat acute attacks.  Make sure you understand how to avoid triggers and how and when to use your medicines.  Asthma attacks can range from minor to life threatening. Get help right away if you have an asthma attack and do not respond to treatment with your usual rescue medicines. This information is not intended to replace advice given to you by your health care provider. Make sure you discuss any questions you have with your health care provider. Document Released: 11/06/2005 Document Revised: 01/09/2019 Document Reviewed: 12/11/2016 Elsevier Patient Education  2020 [employer].

## 2019-08-15 NOTE — Assessment & Plan Note (Addendum)
-   Stable interval; doing well, no daily symptoms. Rare SABA use.  - Continue Dupixent injections 300mg  q14 days - Continue Breo 200 two puffs daily - Continue Albuterol hfa q4-6 hr PRN sob/wheezing (refill sent) - Continue Singulair 10mg  daily  - EPIPEN on hand/take as directed  - Checking CBC with Diff (eos in Novemebr 700), unable to check FENO today d/t covid restrictions  - FU in 1 year with Dr. Chase Caller or sooner if needed

## 2019-08-15 NOTE — Progress Notes (Signed)
Have you been hospitalized within the last 10 days?  No Do you have a fever?  No Do you have a cough?  No Do you have a headache or sore throat? No  

## 2019-08-15 NOTE — Progress Notes (Signed)
Please let patient know eosinophils are elevated but stable (900). No changes as long as he remains asymptomatic.

## 2019-08-18 ENCOUNTER — Telehealth: Payer: Self-pay | Admitting: Internal Medicine

## 2019-08-18 NOTE — Telephone Encounter (Signed)
Dupixent Order: 300mg  #2 prefilled syringe Ordered Date: 08/18/2019 Expected date of arrival: 08/20/2019 Ordered by: Desmond Dike, [place]  Specialty Pharmacy: [facility]

## 2019-08-20 NOTE — Telephone Encounter (Signed)
Dupixent Shipment Received: 300mg  #2 prefilled syringe Medication arrival date: 08/20/19 Lot #: [id number] Exp date: 12/21/2021 Received by: Lattie Haw, LPN

## 2019-08-25 ENCOUNTER — Other Ambulatory Visit: Payer: Self-pay

## 2019-08-25 ENCOUNTER — Other Ambulatory Visit (INDEPENDENT_AMBULATORY_CARE_PROVIDER_SITE_OTHER): Payer: 59

## 2019-08-25 DIAGNOSIS — Z23 Encounter for immunization: Secondary | ICD-10-CM | POA: Diagnosis not present

## 2019-08-29 ENCOUNTER — Ambulatory Visit (INDEPENDENT_AMBULATORY_CARE_PROVIDER_SITE_OTHER): Payer: 59

## 2019-08-29 ENCOUNTER — Other Ambulatory Visit: Payer: Self-pay

## 2019-08-29 DIAGNOSIS — J4550 Severe persistent asthma, uncomplicated: Secondary | ICD-10-CM

## 2019-08-29 MED ORDER — DUPILUMAB 300 MG/2ML ~~LOC~~ SOSY
300.0000 mg | PREFILLED_SYRINGE | Freq: Once | SUBCUTANEOUS | Status: AC
  Administered 2019-08-29: 11:00:00 300 mg via SUBCUTANEOUS

## 2019-08-29 NOTE — Progress Notes (Signed)
All questions were answered by the patient before medication was administered. Have you been hospitalized in the last 10 days? No Do you have a fever? No Do you have a cough? No Do you have a headache or sore throat? No  

## 2019-09-12 ENCOUNTER — Other Ambulatory Visit: Payer: Self-pay

## 2019-09-12 ENCOUNTER — Ambulatory Visit (INDEPENDENT_AMBULATORY_CARE_PROVIDER_SITE_OTHER): Payer: 59

## 2019-09-12 DIAGNOSIS — J4550 Severe persistent asthma, uncomplicated: Secondary | ICD-10-CM

## 2019-09-12 MED ORDER — DUPILUMAB 300 MG/2ML ~~LOC~~ SOSY
300.0000 mg | PREFILLED_SYRINGE | Freq: Once | SUBCUTANEOUS | Status: AC
  Administered 2019-09-12: 10:00:00 300 mg via SUBCUTANEOUS

## 2019-09-12 NOTE — Progress Notes (Signed)
All questions were answered by the patient before medication was administered. Have you been hospitalized in the last 10 days? No Do you have a fever? No Do you have a cough? No Do you have a headache or sore throat? No  

## 2019-09-22 ENCOUNTER — Telehealth: Payer: Self-pay | Admitting: Internal Medicine

## 2019-09-22 NOTE — Telephone Encounter (Signed)
Dupixent Order: 300mg  #2 prefilled syringe Ordered Date: 09/22/2019 Expected date of arrival: 09/24/2019 Ordered by: Desmond Dike, [place]  Specialty Pharmacy: [facility]

## 2019-09-24 NOTE — Telephone Encounter (Signed)
Dupixent Shipment Received: 300mg  #2 prefilled syringe Medication arrival date: 09/24/2019 Lot #: [id number] Exp date: 01/2022 Received by: Desmond Dike, [place]

## 2019-09-29 ENCOUNTER — Ambulatory Visit: Payer: 59

## 2019-09-29 DIAGNOSIS — U071 COVID-19: Secondary | ICD-10-CM

## 2019-09-29 HISTORY — DX: COVID-19: U07.1

## 2019-09-30 ENCOUNTER — Telehealth: Payer: Self-pay | Admitting: Internal Medicine

## 2019-09-30 NOTE — Telephone Encounter (Signed)
Patient returned call - he can be reached at [phone] -pr

## 2019-09-30 NOTE — Telephone Encounter (Signed)
Left message for patient to call back  

## 2019-09-30 NOTE — Telephone Encounter (Signed)
LMTCB

## 2019-10-01 NOTE — Telephone Encounter (Signed)
Spoke with pt. States that he tested positive for COVID on 09/29/2019. He has some friends from out of state come for a visit and their son tested positive for COVID. He believes this is where he picked it up at. The only symptom he had was temp on Sunday night and into Monday. The temp has since broke and he is no longer having any symptoms. His wife has a pending COVID test, his daughter has tested negative. Pt has been quarantining himself from both of them. Pt would like to know if MR has any other recommendations for him.  MR - please advise. Thanks.

## 2019-10-01 NOTE — Telephone Encounter (Signed)
Returning call regarding concerns of positive COVID test - please advise CB# [phone]

## 2019-10-02 ENCOUNTER — Encounter: Payer: 59 | Admitting: Family Medicine

## 2019-10-02 MED ORDER — PREDNISONE 10 MG PO TABS: ORAL_TABLET | ORAL | 0 refills | Status: DC

## 2019-10-02 NOTE — Telephone Encounter (Signed)
Called and spoke to patient. Advised patient of Dr. Golden Pop response. Patient verbalized understanding. Patient clarified pharmacy of choice. Rx sent to pharmacy. Nothing further needed at this time.

## 2019-10-02 NOTE — Telephone Encounter (Signed)
Spoke with patient. He verbalized understanding of recommendations. He wanted to know if MR would recommend him having a prednisone taper on hand just in case he starts to go downhill fast with his breathing. He stated that so far he has been feeling ok and has been isolated at home. He does have friends who can drop off items for him at his door so he is not in direct contact with them. Advised him that I would ask MR his thoughts on prednisone. He verbalized understanding.   MR, please advise. Thanks!

## 2019-10-02 NOTE — Telephone Encounter (Signed)
Yes that is fine for him to have  - Take prednisone 40 mg daily x 2 days, then 20mg  daily x 2 days, then 10mg  daily x 2 days, then 5mg  daily x 2 days and stop  However, the moment is oxygen level goes down (only small chance) he should go to ER so he can get Rx that can help such as remdesivir

## 2019-10-02 NOTE — Telephone Encounter (Signed)
Best wishes for a speedy recovery.  More than likely he will do well.  plan\  Hydrate well  Watch pulse ox with a pocket pulse oximeter particularly when walking or at rest -especially if he is getting short of breath.  If his oxygen drops or is getting short of breath he needs to go to the ER  Watch for other symptoms anything getting worse  Usually around day 7 through day 14 when some patients can take a turn for the worse.  He has to closely monitor himself during this time   Take general multivitamin -might or might not help  Maintain asthma treatment  Quarantine himself from everybody for the next 11 days  There is an outpatient infusion Rx called Banvalimuab that is now approved but do not think [facility] has allocation yet . He might or might not qualify for it. For him if he qualifies should get it before 10/09/2019 (day 10) . I doubt we will have it in time even if he meets criteria

## 2019-10-13 ENCOUNTER — Telehealth: Payer: Self-pay | Admitting: Internal Medicine

## 2019-10-13 MED ORDER — ALBUTEROL SULFATE (2.5 MG/3ML) 0.083% IN NEBU: 2.5000 mg | INHALATION_SOLUTION | Freq: Four times a day (QID) | RESPIRATORY_TRACT | 11 refills | Status: DC | PRN

## 2019-10-13 NOTE — Telephone Encounter (Signed)
Refill of albuterol neb sol sent to pt's preferred pharmacy. Called and spoke with pt letting him know this had been done and pt verbalized understanding. Nothing further needed.

## 2019-10-14 ENCOUNTER — Ambulatory Visit (INDEPENDENT_AMBULATORY_CARE_PROVIDER_SITE_OTHER): Payer: 59

## 2019-10-14 ENCOUNTER — Other Ambulatory Visit: Payer: Self-pay

## 2019-10-14 DIAGNOSIS — J4550 Severe persistent asthma, uncomplicated: Secondary | ICD-10-CM | POA: Diagnosis not present

## 2019-10-14 MED ORDER — DUPILUMAB 300 MG/2ML ~~LOC~~ SOSY
300.0000 mg | PREFILLED_SYRINGE | Freq: Once | SUBCUTANEOUS | Status: AC
  Administered 2019-10-14: 09:00:00 300 mg via SUBCUTANEOUS

## 2019-10-14 NOTE — Progress Notes (Signed)
All questions were answered by the patient before medication was administered. Have you been hospitalized in the last 10 days? No Do you have a fever? No Do you have a cough? No Do you have a headache or sore throat? No Seth Kerr, [facility] 10/14/19

## 2019-10-20 ENCOUNTER — Telehealth: Payer: Self-pay

## 2019-10-20 NOTE — Telephone Encounter (Signed)
Received a package from [facility] containing a 90-day supply of patient's Montelukast in the injection room.  I see no documentation as to why this was shipped to our office.   Spoke with MR's CMA Raquel Sarna who confirmed that this is not pulmonix related, and could also not see why this was shipped to our office instead of pt's home.  lmtcb X1 for pt- will leave med up front for pick-up.  Need to make pt aware of what has happened.  Will await call back.

## 2019-10-30 ENCOUNTER — Telehealth: Payer: Self-pay | Admitting: Internal Medicine

## 2019-10-31 NOTE — Telephone Encounter (Signed)
Called [facility] to follow up on need for new Dupixent PA.  Spoke with Terrence Dupont to initiate Dupixent PA. Patient insurance with [facility] is showing no drug coverage. Will follow up once insurance is confirmed.

## 2019-11-01 ENCOUNTER — Encounter: Payer: Self-pay | Admitting: Family Medicine

## 2019-11-01 NOTE — Patient Instructions (Addendum)
  HEALTH MAINTENANCE RECOMMENDATIONS:  It is recommended that you get at least 30 minutes of aerobic exercise at least 5 days/week (for weight loss, you may need as much as 60-90 minutes). This can be any activity that gets your heart rate up. This can be divided in 10-15 minute intervals if needed, but try and build up your endurance at least once a week.  Weight bearing exercise is also recommended twice weekly.  Eat a healthy diet with lots of vegetables, fruits and fiber.  "Colorful" foods have a lot of vitamins (ie green vegetables, tomatoes, red peppers, etc).  Limit sweet tea, regular sodas and alcoholic beverages, all of which has a lot of calories and sugar.  Up to 2 alcoholic drinks daily may be beneficial for men (unless trying to lose weight, watch sugars).  Drink a lot of water.  Sunscreen of at least SPF 30 should be used on all sun-exposed parts of the skin when outside between the hours of 10 am and 4 pm (not just when at beach or pool, but even with exercise, golf, tennis, and yard work!)  Use a sunscreen that says "broad spectrum" so it covers both UVA and UVB rays, and make sure to reapply every 1-2 hours.  Remember to change the batteries in your smoke detectors when changing your clock times in the spring and fall. Carbon monoxide detectors are recommended for your home.  Use your seat belt every time you are in a car, and please drive safely and not be distracted with cell phones and texting while driving.  We talked about the Shingles vaccine (for when you are 50). I recommend getting the new shingles vaccine (Shingrix). You will need to check with your insurance to see if it is covered, schedule a nurse visit when convenient.  It is a series of 2 injections, spaced 2 months apart.  You will need another colonoscopy next year (age 10).  Try cutting back on caffeine and alcohol to see if your urine stream improves. We briefly mentioned Saw Palmetto supplement, which helps with  prostate symptoms. Your prostate was not enlarged, but if the other measures don't help, you can see if it helps.

## 2019-11-01 NOTE — Progress Notes (Signed)
Chief Complaint  Patient presents with  . Annual Exam    fasting annual exam. Had Seth Kerr in November has some questions today.     Seth Kerr is a 48 y.o. male who presents for a complete physical.    He had COVID-19 infection in November. Course was pretty mild--fever x 1 day, headache, loss of taste/smell, fatigue.  Only slight cough (which he relates to not getting his Dupixent). 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 Chase Caller, and is doing extremely well on Dupixent injections q2 weeks. He remains on montelukast, Breo and albuterol prn. He hasn't need to use any albuterol in a while, other than once with COVID infection, as reported above.  Allergies: He no longer sees Dr. Donneta Romberg, no longer getting immunotherapy. Hasn't been bothered by any allergies in a while, since being on the [place]. He continues on Singulair, and Nasonex.  ED comes and goes. Has used medication in the past and would like refill. Sometimes he feels like he doesn't empty his bladder well--either takes a long time, or has to go again shortly after. Gets up once at night to void. 2 cups/coffee daily, and drinks a lot of water, so goes frequently during the day.  Immunization History  Administered Date(s) Administered  . Influenza Split 09/12/2012, 08/29/2014, 09/05/2016  . Influenza,inj,Quad PF,6+ Mos 10/09/2013, 09/03/2015, 08/29/2017, 08/28/2018, 08/25/2019  . Pneumococcal Polysaccharide-23 05/05/2015  . Tdap 10/04/2010, 04/22/2014   Last colonoscopy: done at approx age 1-36, and another one age 58. Reportedly normal. Diagnosed with IBS.  Last was done in [place]. Last PSA: done through work once (at age 4) Dentist: twice yearly Optho: 2 years ago, wears glasses Exercise: 5 days/week or more, bikes, runs, walks.  Push-ups. No longer using  other weights. Training for Ironman in 08/2020 (last year's was cancelled).  Lipids: Lab Results  Component Value Date   CHOL 212 (H) 09/25/2018   HDL 83 09/25/2018   LDLCALC 117 (H) 09/25/2018   TRIG 59 09/25/2018   CHOLHDL 2.6 09/25/2018   Vitamin D normal at 44 in 2015 Normal thyroid screen 2015  PMH, PSH, SH and FH were reviewed and updated  Outpatient Encounter Medications as of 11/03/2019  Medication Sig Note  . Cholecalciferol (VITAMIN D) 50 MCG (2000 UT) CAPS Take 2,000 Units by mouth daily.   . dupilumab (DUPIXENT) 300 MG/2ML prefilled syringe Inject 300 mg into the skin every 14 (fourteen) days.   . fluticasone furoate-vilanterol (BREO ELLIPTA) 200-25 MCG/INH AEPB Inhale 1 puff into the lungs daily.   . mometasone (NASONEX) 50 MCG/ACT nasal spray Place 2 sprays into the nose 2 (two) times daily. Reported on 05/17/2016 08/29/2017: Uses nasal steroid seasonally, usually OTC kind  . montelukast (SINGULAIR) 10 MG tablet Take 1 tablet (10 mg total) by mouth at bedtime. Due for refill 11/14/2019   . [DISCONTINUED] BREO ELLIPTA 200-25 MCG/INH AEPB Inhale 1 puff into the lungs every morning.    Marland Kitchen albuterol (PROVENTIL) (2.5 MG/3ML) 0.083% nebulizer solution Take 3 mLs (2.5 mg total) by nebulization every 6 (six) hours as needed for wheezing or shortness of breath. (Patient not taking: Reported on 11/03/2019)   . EPIPEN 2-PAK 0.3 MG/0.3ML SOAJ injection Use as directed (Patient not taking: Reported on 11/03/2019)   . predniSONE (DELTASONE) 10 MG tablet 4 tabs X2 day, 2 tab X2  day, 1 tab X2 day, 0.5 tab X2 day (Patient not taking: Reported on 11/03/2019) 11/03/2019: Rx'd for when he had COVID, didn't need, on hand for prn  . PROAIR RESPICLICK 123XX123 (90 Base) MCG/ACT AEPB Inhale 2 puffs into the lungs every 4 (four) hours as needed. (Patient not taking: Reported on 11/03/2019)    No facility-administered encounter medications on file as of 11/03/2019.    ROS: The patient denies anorexia,  headaches,vision loss, decreased hearing, ear pain, hoarseness, palpitations, dizziness, syncope, dyspnea on exertion, cough, swelling, nausea, vomiting, diarrhea, constipation, abdominal pain, melena, hematochezia, indigestion/heartburn, hematuria, incontinence, numbness, tingling, weakness, tremor, suspicious skin lesions, depression, anxiety, abnormal bleeding/bruising, or enlarged lymph nodes ED intermittently. Urinary symptoms per HPI Asthma/allergies are controlled. Slight SOB with exertion with COVID, resolved.   PHYSICAL EXAM:  BP 120/90   Pulse (!) 56   Temp (!) 96.4 F (35.8 C) (Other (Comment))   Ht 5\' 11"  (1.803 m)   Wt 193 lb (87.5 kg)   BMI 26.92 kg/m   Wt Readings from Last 3 Encounters:  11/03/19 193 lb (87.5 kg)  08/15/19 185 lb (83.9 kg)  09/25/18 192 lb 9.6 oz (87.4 kg)   120/80 on repeat by MD  General Appearance:   Alert, cooperative, no distress, appears stated age  Head:   Normocephalic, without obvious abnormality, atraumatic  Eyes:   PERRL, conjunctiva/corneas clear, EOM's intact, fundi benign  Ears:   Normal TM's and external ear canals  Nose:  Not examined, wearing mask due to COVID-19 pandemic  Throat:  Not examined, wearing mask due to COVID-19 pandemic  Neck:  Supple, no lymphadenopathy; thyroid: noenlargement/tenderness/ nodules; no carotid bruit or JVD  Back:  Spine nontender, no curvature, ROM normal, no CVA tenderness  Lungs:  Respirations unlabored, good air movement. No wheezing, rales, ronchi  Chest Wall:   No tenderness or deformity  Heart:   Regular rate and rhythm, S1 and S2 normal, no murmur, rub or gallop  Breast Exam:   No chest wall tenderness, masses or gynecomastia  Abdomen:   Soft, non-tender, nondistended, normoactive bowel sounds, no masses, no hepatosplenomegaly  Genitalia:   Normal male external genitalia without lesions. Testicles without masses. No  inguinal hernias.  Rectal:   Normal sphincter tone, no masses or tenderness; heme negative stool. Prostate smooth, no nodules, notenlarged.  Extremities:  No clubbing, cyanosis or edema.   Pulses:  2+ and symmetric all extremities  Skin:  Skin color, texture, turgor normal, no rashes or suspicious lesions.   Lymph nodes:  Cervical, supraclavicular, and axillary nodes normal  Neurologic:  Normal strength, sensation and gait; reflexes 2+ and symmetric throughout   Psych: Normal mood, affect, hygiene and grooming  Normal urine dip   ASSESSMENT/PLAN:  Annual physical exam - Plan: POCT Urinalysis DIP (Proadvantage Device)  Erectile dysfunction, unspecified erectile dysfunction type - Plan: sildenafil (VIAGRA) 100 MG tablet  Voiding dysfunction - discussed impact of caffeine, alcohol. Prostate is normal, no e/o infection. Let us know if persists/worsens  Reviewed prior labs, okay to wait until next year.   Discussed PSA screening (risks/benefits)--start at age 77; recommended at least 30 minutes of aerobic activity at least 5 days/week, weight-bearing exercise at least 2x/week; proper sunscreen use reviewed; healthy diet and alcohol recommendations (less than or equal to 2 drinks/day) reviewed; regular seatbelt use; changing batteries in smoke detectors, carbon monoxide detectors ecommended. Self-testicular exams. Immunization recommendations discussed--continue yearly flu shots. Shingrix age 2 (discussed SE, to check with insurance and schedule [place] after  age 14, when convenient). Colonoscopy recommendations reviewed--due again age 45 (sooner prn problems).  F/u 1 year for CPE, sooner prn.

## 2019-11-03 ENCOUNTER — Ambulatory Visit: Payer: 59 | Admitting: Family Medicine

## 2019-11-03 ENCOUNTER — Other Ambulatory Visit: Payer: Self-pay

## 2019-11-03 ENCOUNTER — Encounter: Payer: Self-pay | Admitting: Family Medicine

## 2019-11-03 VITALS — BP 120/80 | HR 56 | Temp 96.4°F | Ht 71.0 in | Wt 193.0 lb

## 2019-11-03 DIAGNOSIS — N529 Male erectile dysfunction, unspecified: Secondary | ICD-10-CM | POA: Diagnosis not present

## 2019-11-03 DIAGNOSIS — N398 Other specified disorders of urinary system: Secondary | ICD-10-CM | POA: Diagnosis not present

## 2019-11-03 DIAGNOSIS — Z0000 Encounter for general adult medical examination without abnormal findings: Secondary | ICD-10-CM | POA: Diagnosis not present

## 2019-11-03 LAB — POCT URINALYSIS DIP (PROADVANTAGE DEVICE)
Bilirubin, UA: NEGATIVE
Blood, UA: NEGATIVE
Glucose, UA: NEGATIVE mg/dL
Ketones, POC UA: NEGATIVE mg/dL
Leukocytes, UA: NEGATIVE
Nitrite, UA: NEGATIVE
Protein Ur, POC: NEGATIVE mg/dL
Specific Gravity, Urine: 1.025
Urobilinogen, Ur: NEGATIVE
pH, UA: 6 (ref 5.0–8.0)

## 2019-11-03 MED ORDER — SILDENAFIL CITRATE 100 MG PO TABS: 50.0000 mg | ORAL_TABLET | Freq: Every day | ORAL | 11 refills | Status: DC | PRN

## 2019-11-03 NOTE — Telephone Encounter (Signed)
ATC [facility] to follow up on this PA. Was placed on a long hold with no one coming to the line. Will try back.

## 2019-11-04 ENCOUNTER — Encounter: Payer: Self-pay | Admitting: Family Medicine

## 2019-11-06 NOTE — Telephone Encounter (Signed)
ATC [facility] to follow up on this PA. Was placed on a long hold with no one coming to the line.  Started PA on Cover My Meds >> Key: [place] Received this message when trying to process the renew PA >> This medication or product is on your plan's list of covered drugs. Prior authorization is not required at this time.  LMTCB x1 for pt.

## 2019-11-07 ENCOUNTER — Other Ambulatory Visit: Payer: Self-pay

## 2019-11-07 ENCOUNTER — Ambulatory Visit (INDEPENDENT_AMBULATORY_CARE_PROVIDER_SITE_OTHER): Payer: 59

## 2019-11-07 DIAGNOSIS — J4550 Severe persistent asthma, uncomplicated: Secondary | ICD-10-CM | POA: Diagnosis not present

## 2019-11-07 MED ORDER — DUPILUMAB 300 MG/2ML ~~LOC~~ SOSY
300.0000 mg | PREFILLED_SYRINGE | Freq: Once | SUBCUTANEOUS | Status: AC
  Administered 2019-11-07: 300 mg via SUBCUTANEOUS

## 2019-11-07 NOTE — Progress Notes (Signed)
Have you been hospitalized within the last 10 days?  No Do you have a fever?  No Do you have a cough?  No Do you have a headache or sore throat? No Do you have your Epi Pen visible and is it within date?  Yes 

## 2019-11-11 NOTE — Telephone Encounter (Signed)
Spoke with pt. Advised him of the message I received from Cover My Meds. Pt's PA will need to be renewed closer to 11/20/2019. Will leave message open to follow up on.

## 2019-11-11 NOTE — Telephone Encounter (Signed)
LMTCB x2 for pt 

## 2019-11-17 ENCOUNTER — Telehealth: Payer: Self-pay | Admitting: Internal Medicine

## 2019-11-17 NOTE — Telephone Encounter (Signed)
Dupixent Order: 300mg  #2 prefilled syringe Ordered Date: 11/17/19 Expected date of arrival: 11/19/19 Ordered by: [place]: Lennette Bihari

## 2019-11-19 NOTE — Telephone Encounter (Signed)
CMM Dupixent PA is still unable to complete at this time.  Per [facility]- Your request has been n/a. This medication or product is on your plan's list of covered drugs. Prior authorization is not required at this time.  Will attempt Dupixent PA 11/2019.

## 2019-11-19 NOTE — Telephone Encounter (Signed)
Dupixent PA initiated 11/19/19 for 2021 year on CMM. CMM key- [place] Per CMM PA decision may take up to 72 hours for discission. Will follow up.

## 2019-11-19 NOTE — Telephone Encounter (Signed)
Dupixent Shipment Received: 300mg  #2 prefilled syringe Medication arrival date: 11/19/19 Lot #: [id number] Exp date: 03/20/2022 Received by: Elliot Dally

## 2019-11-20 NOTE — Telephone Encounter (Signed)
Attempted Dupixent PA on CMM.  At this time it is still not allowing PA.  Will have to be followed up 11/24/2019.

## 2019-11-24 ENCOUNTER — Other Ambulatory Visit: Payer: Self-pay

## 2019-11-24 ENCOUNTER — Ambulatory Visit (INDEPENDENT_AMBULATORY_CARE_PROVIDER_SITE_OTHER): Payer: 59

## 2019-11-24 DIAGNOSIS — J4550 Severe persistent asthma, uncomplicated: Secondary | ICD-10-CM | POA: Diagnosis not present

## 2019-11-24 MED ORDER — DUPILUMAB 300 MG/2ML ~~LOC~~ SOSY
300.0000 mg | PREFILLED_SYRINGE | Freq: Once | SUBCUTANEOUS | Status: AC
  Administered 2019-11-24: 09:00:00 300 mg via SUBCUTANEOUS

## 2019-11-24 NOTE — Progress Notes (Signed)
All questions were answered by the patient before medication was administered. Have you been hospitalized in the last 10 days? No Do you have a fever? No Do you have a cough? No Do you have a headache or sore throat? No  Time medication was removed from the refrigerator: [phone]

## 2019-11-25 NOTE — Telephone Encounter (Signed)
Received a determination on Cover My Meds. PA is approved through 05/24/2020.  Spoke with pt. He is aware. Nothing further was needed.

## 2019-11-25 NOTE — Telephone Encounter (Signed)
PA has been completed on Cover My Meds. [id number] Will await PA decision.

## 2019-12-04 NOTE — Telephone Encounter (Signed)
Pt still not picked up medication.  Will leave in injection room for his appt on 12/08/19 he can take it home with him that day.  ATC no answer left message to call back Nothing further needed at this time

## 2019-12-08 ENCOUNTER — Other Ambulatory Visit: Payer: Self-pay

## 2019-12-08 ENCOUNTER — Ambulatory Visit (INDEPENDENT_AMBULATORY_CARE_PROVIDER_SITE_OTHER): Payer: 59

## 2019-12-08 DIAGNOSIS — J4550 Severe persistent asthma, uncomplicated: Secondary | ICD-10-CM

## 2019-12-08 MED ORDER — DUPILUMAB 300 MG/2ML ~~LOC~~ SOSY
300.0000 mg | PREFILLED_SYRINGE | Freq: Once | SUBCUTANEOUS | Status: AC
  Administered 2019-12-08: 10:00:00 300 mg via SUBCUTANEOUS

## 2019-12-08 NOTE — Progress Notes (Signed)
Have you been hospitalized within the last 10 days?  No Do you have a fever?  No Do you have a cough?  No Do you have a headache or sore throat? No  Patient Dupixent removed from refrigerator at 0824.

## 2019-12-15 ENCOUNTER — Telehealth: Payer: Self-pay | Admitting: Internal Medicine

## 2019-12-15 NOTE — Telephone Encounter (Signed)
Dupixent Order: 300mg  #2 prefilled syringe Ordered Date: 12/15/2019 Expected date of arrival: 12/17/2019 Ordered by: Desmond Dike, [place]  Specialty Pharmacy: [facility]

## 2019-12-19 NOTE — Telephone Encounter (Signed)
Contacted [facility] to check on the status of the pt's shipment. For some reason the shipment was placed on hold. The shipment will now be here on Monday, 12/22/2019.  Spoke with pt and advised him of what was going on. He was very understanding. His appointment has been moved to 12/22/2019 at 1400 from 1030. Will await shipment.

## 2019-12-22 ENCOUNTER — Ambulatory Visit (INDEPENDENT_AMBULATORY_CARE_PROVIDER_SITE_OTHER): Payer: 59

## 2019-12-22 ENCOUNTER — Ambulatory Visit: Payer: 59

## 2019-12-22 ENCOUNTER — Other Ambulatory Visit: Payer: Self-pay

## 2019-12-22 ENCOUNTER — Telehealth: Payer: Self-pay | Admitting: Internal Medicine

## 2019-12-22 DIAGNOSIS — J4550 Severe persistent asthma, uncomplicated: Secondary | ICD-10-CM

## 2019-12-22 MED ORDER — DUPILUMAB 300 MG/2ML ~~LOC~~ SOSY
300.0000 mg | PREFILLED_SYRINGE | Freq: Once | SUBCUTANEOUS | Status: AC
  Administered 2019-12-22: 300 mg via SUBCUTANEOUS

## 2019-12-22 NOTE — Telephone Encounter (Signed)
Dupixent Shipment Received: 300mg  #2 prefilled syringe Medication arrival date: 21/1/21 Lot #: [id number] Exp date: 03/20/2022 Received by: Elliot Dally

## 2019-12-22 NOTE — Telephone Encounter (Signed)
Fax received from [facility] stating Patient has been approved for Dupixent 300mg /64ml, through 05/24/2020. Nothing further at this time.

## 2019-12-22 NOTE — Progress Notes (Signed)
Have you been hospitalized within the last 10 days?  No Do you have a fever?  No Do you have a cough?  No Do you have a headache or sore throat? No  

## 2020-01-05 ENCOUNTER — Other Ambulatory Visit: Payer: Self-pay

## 2020-01-05 ENCOUNTER — Other Ambulatory Visit: Payer: Self-pay | Admitting: Internal Medicine

## 2020-01-05 ENCOUNTER — Ambulatory Visit (INDEPENDENT_AMBULATORY_CARE_PROVIDER_SITE_OTHER): Payer: 59

## 2020-01-05 DIAGNOSIS — J4550 Severe persistent asthma, uncomplicated: Secondary | ICD-10-CM

## 2020-01-05 MED ORDER — DUPILUMAB 300 MG/2ML ~~LOC~~ SOSY
300.0000 mg | PREFILLED_SYRINGE | Freq: Once | SUBCUTANEOUS | Status: AC
  Administered 2020-01-05: 10:00:00 300 mg via SUBCUTANEOUS

## 2020-01-05 NOTE — Progress Notes (Signed)
Have you been hospitalized within the last 10 days?  No Do you have a fever?  No Do you have a cough?  No Do you have a headache or sore throat? No  

## 2020-01-12 ENCOUNTER — Telehealth: Payer: Self-pay | Admitting: Internal Medicine

## 2020-01-12 NOTE — Telephone Encounter (Signed)
Dupixent Order: 300mg  #2 prefilled syringe Ordered Date: 01/12/2020 Expected date of arrival: 01/15/2020 Ordered by: Desmond Dike, [place]  Specialty Pharmacy: [facility]

## 2020-01-15 NOTE — Telephone Encounter (Signed)
Dupixent Shipment Received: 300mg  #2 prefilled syringe Medication arrival date: 01/15/2020 Lot #: [id number] Exp date: 05/2022 Received by: Desmond Dike, CMA

## 2020-01-19 ENCOUNTER — Ambulatory Visit (INDEPENDENT_AMBULATORY_CARE_PROVIDER_SITE_OTHER): Payer: 59

## 2020-01-19 ENCOUNTER — Other Ambulatory Visit: Payer: Self-pay

## 2020-01-19 DIAGNOSIS — J4550 Severe persistent asthma, uncomplicated: Secondary | ICD-10-CM

## 2020-01-19 MED ORDER — DUPILUMAB 300 MG/2ML ~~LOC~~ SOSY
300.0000 mg | PREFILLED_SYRINGE | Freq: Once | SUBCUTANEOUS | Status: AC
  Administered 2020-01-19: 300 mg via SUBCUTANEOUS

## 2020-01-19 NOTE — Progress Notes (Signed)
All questions were answered by the patient before medication was administered. Have you been hospitalized in the last 10 days? No Do you have a fever? No Do you have a cough? No Do you have a headache or sore throat? No  

## 2020-02-02 ENCOUNTER — Ambulatory Visit (INDEPENDENT_AMBULATORY_CARE_PROVIDER_SITE_OTHER): Payer: 59

## 2020-02-02 ENCOUNTER — Other Ambulatory Visit: Payer: Self-pay

## 2020-02-02 DIAGNOSIS — J4550 Severe persistent asthma, uncomplicated: Secondary | ICD-10-CM

## 2020-02-02 MED ORDER — DUPILUMAB 300 MG/2ML ~~LOC~~ SOSY
300.0000 mg | PREFILLED_SYRINGE | Freq: Once | SUBCUTANEOUS | Status: AC
  Administered 2020-02-02: 300 mg via SUBCUTANEOUS

## 2020-02-02 NOTE — Progress Notes (Signed)
All questions were answered by the patient before medication was administered. Have you been hospitalized in the last 10 days? No Do you have a fever? No Do you have a cough? No Do you have a headache or sore throat? No  

## 2020-02-09 ENCOUNTER — Telehealth: Payer: Self-pay | Admitting: Internal Medicine

## 2020-02-09 NOTE — Telephone Encounter (Signed)
Dupixent Order: 300mg  #2 prefilled syringe Ordered Date: 02/09/20 Expected date of arrival: 02/11/20 Ordered by: [place]: [facility]

## 2020-02-10 ENCOUNTER — Other Ambulatory Visit: Payer: Self-pay | Admitting: Internal Medicine

## 2020-02-11 NOTE — Telephone Encounter (Signed)
Dupixent Shipment Received: 300mg  #2 prefilled syringe Medication arrival date: 02/11/2020 Lot #: [id number] Exp date: 06/2022 Received by: Desmond Dike, [place]

## 2020-02-16 ENCOUNTER — Other Ambulatory Visit: Payer: Self-pay

## 2020-02-16 ENCOUNTER — Ambulatory Visit (INDEPENDENT_AMBULATORY_CARE_PROVIDER_SITE_OTHER): Payer: 59

## 2020-02-16 DIAGNOSIS — J4550 Severe persistent asthma, uncomplicated: Secondary | ICD-10-CM

## 2020-02-16 MED ORDER — DUPILUMAB 200 MG/1.14ML ~~LOC~~ SOSY
200.0000 mg | PREFILLED_SYRINGE | Freq: Once | SUBCUTANEOUS | Status: AC
  Administered 2020-02-16: 200 mg via SUBCUTANEOUS

## 2020-02-16 NOTE — Progress Notes (Signed)
All questions were answered by the patient before medication was administered. Have you been hospitalized in the last 10 days? No Do you have a fever? No Do you have a cough? No Do you have a headache or sore throat? No  

## 2020-02-19 ENCOUNTER — Encounter: Payer: 59 | Admitting: Family Medicine

## 2020-03-04 ENCOUNTER — Other Ambulatory Visit: Payer: Self-pay

## 2020-03-04 ENCOUNTER — Ambulatory Visit (INDEPENDENT_AMBULATORY_CARE_PROVIDER_SITE_OTHER): Payer: 59

## 2020-03-04 DIAGNOSIS — J4550 Severe persistent asthma, uncomplicated: Secondary | ICD-10-CM | POA: Diagnosis not present

## 2020-03-04 MED ORDER — DUPILUMAB 300 MG/2ML ~~LOC~~ SOSY
300.0000 mg | PREFILLED_SYRINGE | Freq: Once | SUBCUTANEOUS | Status: AC
  Administered 2020-03-04: 09:00:00 300 mg via SUBCUTANEOUS

## 2020-03-04 NOTE — Progress Notes (Signed)
All questions were answered by the patient before medication was administered. Have you been hospitalized in the last 10 days? No Do you have a fever? No Do you have a cough? No Do you have a headache or sore throat? No  

## 2020-03-08 ENCOUNTER — Telehealth: Payer: Self-pay | Admitting: Internal Medicine

## 2020-03-08 NOTE — Telephone Encounter (Signed)
Dupixent Order: 300mg  #2 prefilled syringe Ordered Date: 03/08/20 Expected date of arrival: 03/10/20 Ordered by: [place]: Lennette Bihari

## 2020-03-10 NOTE — Telephone Encounter (Signed)
Dupixent Shipment Received: 300mg  #2 prefilled syringe Medication arrival date: 03/10/20 Lot #: [id number] Exp date: 06/18/2022 Received by: Elliot Dally

## 2020-03-16 ENCOUNTER — Telehealth: Payer: Self-pay | Admitting: Internal Medicine

## 2020-03-18 ENCOUNTER — Ambulatory Visit: Payer: 59

## 2020-03-18 ENCOUNTER — Telehealth: Payer: Self-pay | Admitting: Internal Medicine

## 2020-03-18 NOTE — Telephone Encounter (Signed)
Spoke with pt. He has been scheduled for his injection on 03/22/20 at 0915. Nothing further needed.

## 2020-03-18 NOTE — Telephone Encounter (Signed)
Spoke with pt. Since originally calling he changed his mind, pt will keep his appointment on 03/22/2020. Nothing further needed.

## 2020-03-22 ENCOUNTER — Other Ambulatory Visit: Payer: Self-pay

## 2020-03-22 ENCOUNTER — Ambulatory Visit (INDEPENDENT_AMBULATORY_CARE_PROVIDER_SITE_OTHER): Payer: 59

## 2020-03-22 DIAGNOSIS — J4550 Severe persistent asthma, uncomplicated: Secondary | ICD-10-CM | POA: Diagnosis not present

## 2020-03-22 MED ORDER — DUPILUMAB 300 MG/2ML ~~LOC~~ SOSY
300.0000 mg | PREFILLED_SYRINGE | Freq: Once | SUBCUTANEOUS | Status: AC
  Administered 2020-03-22: 300 mg via SUBCUTANEOUS

## 2020-03-22 NOTE — Progress Notes (Signed)
Have you been hospitalized within the last 10 days?  No Do you have a fever?  No Do you have a cough?  No Do you have a headache or sore throat? No  

## 2020-04-05 ENCOUNTER — Other Ambulatory Visit: Payer: Self-pay

## 2020-04-05 ENCOUNTER — Ambulatory Visit (INDEPENDENT_AMBULATORY_CARE_PROVIDER_SITE_OTHER): Payer: 59

## 2020-04-05 DIAGNOSIS — J4550 Severe persistent asthma, uncomplicated: Secondary | ICD-10-CM

## 2020-04-05 MED ORDER — DUPILUMAB 300 MG/2ML ~~LOC~~ SOSY
300.0000 mg | PREFILLED_SYRINGE | Freq: Once | SUBCUTANEOUS | Status: AC
  Administered 2020-04-05: 300 mg via SUBCUTANEOUS

## 2020-04-05 NOTE — Progress Notes (Signed)
Have you been hospitalized within the last 10 days?  No Do you have a fever?  No Do you have a cough?  No Do you have a headache or sore throat? No  

## 2020-04-12 ENCOUNTER — Telehealth: Payer: Self-pay | Admitting: Internal Medicine

## 2020-04-12 NOTE — Telephone Encounter (Signed)
Dupixent Order: 300mg  #2 prefilled syringe Ordered Date: 04/12/2020 Expected date of arrival: 04/14/2020 Ordered by: Desmond Dike, [place]  Specialty Pharmacy: [facility]

## 2020-04-14 NOTE — Telephone Encounter (Signed)
Dupixent Shipment Received: 300mg  #2 prefilled syringe Medication arrival date: 04/14/2020 Lot #: [id number] Exp date: 07/2022 Received by: Desmond Dike, [place]

## 2020-04-20 ENCOUNTER — Other Ambulatory Visit: Payer: Self-pay

## 2020-04-20 ENCOUNTER — Ambulatory Visit (INDEPENDENT_AMBULATORY_CARE_PROVIDER_SITE_OTHER): Payer: 59

## 2020-04-20 DIAGNOSIS — J4550 Severe persistent asthma, uncomplicated: Secondary | ICD-10-CM

## 2020-04-20 MED ORDER — DUPILUMAB 300 MG/2ML ~~LOC~~ SOSY
300.0000 mg | PREFILLED_SYRINGE | Freq: Once | SUBCUTANEOUS | Status: AC
  Administered 2020-04-20: 300 mg via SUBCUTANEOUS

## 2020-04-20 NOTE — Progress Notes (Signed)
Have you been hospitalized within the last 10 days?  No Do you have a fever?  No Do you have a cough?  No Do you have a headache or sore throat? No  

## 2020-04-27 ENCOUNTER — Telehealth: Payer: Self-pay | Admitting: Internal Medicine

## 2020-04-27 NOTE — Telephone Encounter (Signed)
PA request was received from [facility] Phone:[phone] Medication name and strength: Dupixent 300mg /64ml prefilled syringes Ordering Provider: Dr. Chase Caller  Was PA started with [facility]?: yes If yes, please enter KEY: [id number] Medication tried and failed: NA Covered Alternatives: NA  PA sent to plan, time frame for approval / denial: undetermined Routing to [place] for follow-up

## 2020-04-27 NOTE — Telephone Encounter (Signed)
PA request was received from [facility]acy[facility] [phone]  Medication name and strength: Dupixent 300 mg/42ml prefilled syringes Ordering Provider: Dr. Chase Caller  Was PA started with [facility]?: yes If yes, please enter KEY: [id number]

## 2020-04-28 NOTE — Telephone Encounter (Signed)
Seth Kerr from [facility]. Valetta Fuller states needs clinical information from enrollment form. Seth Kerr phone number is [phone]. Enrollment key is [id number].

## 2020-04-29 NOTE — Telephone Encounter (Signed)
Covermymeds initiated a Dupixent New Start enrollment form based off of PA. Patient is already on therapy. Renewal PA is pending.

## 2020-04-30 NOTE — Telephone Encounter (Signed)
Called Covermymeds, spoke to [place] he closed out the enrollment form request since patient is not a new start.  Received notification from [facility] regarding a prior authorization for [place]. Authorization has been APPROVED from 04/27/20 to 04/27/21.   Authorization # [id number]  1:37 PM Beatriz Chancellor, CPhT

## 2020-04-30 NOTE — Telephone Encounter (Signed)
Seth Kerr calling to check on the status of [place] enrollment form because it is still missing information. Enrollment Key: [id number]. Seth Kerr can be reached at [phone]

## 2020-05-05 ENCOUNTER — Other Ambulatory Visit: Payer: Self-pay

## 2020-05-05 ENCOUNTER — Ambulatory Visit (INDEPENDENT_AMBULATORY_CARE_PROVIDER_SITE_OTHER): Payer: 59

## 2020-05-05 DIAGNOSIS — J4550 Severe persistent asthma, uncomplicated: Secondary | ICD-10-CM | POA: Diagnosis not present

## 2020-05-05 MED ORDER — DUPILUMAB 300 MG/2ML ~~LOC~~ SOSY
300.0000 mg | PREFILLED_SYRINGE | Freq: Once | SUBCUTANEOUS | Status: AC
  Administered 2020-05-05: 300 mg via SUBCUTANEOUS

## 2020-05-05 NOTE — Progress Notes (Signed)
Have you been hospitalized within the last 10 days?  No Do you have a fever?  No Do you have a cough?  No Do you have a headache or sore throat? No  

## 2020-05-10 ENCOUNTER — Telehealth: Payer: Self-pay | Admitting: Internal Medicine

## 2020-05-10 NOTE — Telephone Encounter (Signed)
Dupixent Order: 300mg  #2 prefilled syringe Ordered Date: 05/10/20 Expected date of arrival: 05/12/20 Ordered by: [place]: [facility]  Order # [id number]

## 2020-05-11 NOTE — Telephone Encounter (Signed)
Checked status of PA for Dupixent and it had a favorable outcome.  Will route it to the injection pool.

## 2020-05-12 NOTE — Telephone Encounter (Signed)
Dupixent Shipment Received: 300mg  #2 prefilled syringe Medication arrival date: 05/12/2020 Lot #: [id number] Exp date: 07/2022 Received by: Desmond Dike, [place]

## 2020-05-15 ENCOUNTER — Other Ambulatory Visit: Payer: Self-pay | Admitting: Primary Care

## 2020-05-19 ENCOUNTER — Other Ambulatory Visit: Payer: Self-pay

## 2020-05-19 ENCOUNTER — Ambulatory Visit (INDEPENDENT_AMBULATORY_CARE_PROVIDER_SITE_OTHER): Payer: 59

## 2020-05-19 DIAGNOSIS — J4550 Severe persistent asthma, uncomplicated: Secondary | ICD-10-CM | POA: Diagnosis not present

## 2020-05-19 MED ORDER — DUPILUMAB 300 MG/2ML ~~LOC~~ SOSY
300.0000 mg | PREFILLED_SYRINGE | Freq: Once | SUBCUTANEOUS | Status: AC
  Administered 2020-05-19: 300 mg via SUBCUTANEOUS

## 2020-05-19 NOTE — Progress Notes (Signed)
All questions were answered by the patient before medication was administered. Have you been hospitalized in the last 10 days? No Do you have a fever? No Do you have a cough? No Do you have a headache or sore throat? No  

## 2020-06-02 ENCOUNTER — Telehealth: Payer: Self-pay | Admitting: Internal Medicine

## 2020-06-02 ENCOUNTER — Other Ambulatory Visit: Payer: Self-pay

## 2020-06-02 ENCOUNTER — Ambulatory Visit (INDEPENDENT_AMBULATORY_CARE_PROVIDER_SITE_OTHER): Payer: 59

## 2020-06-02 DIAGNOSIS — J4550 Severe persistent asthma, uncomplicated: Secondary | ICD-10-CM | POA: Diagnosis not present

## 2020-06-02 MED ORDER — DUPILUMAB 300 MG/2ML ~~LOC~~ SOSY
300.0000 mg | PREFILLED_SYRINGE | Freq: Once | SUBCUTANEOUS | Status: AC
  Administered 2020-06-02: 300 mg via SUBCUTANEOUS

## 2020-06-02 NOTE — Telephone Encounter (Signed)
Pt came in today for his Dupixent injection. He is interested in doing home injections.  Dr. Chase Caller - please advise if you are okay with the pt transitioning to home injections. Thanks.

## 2020-06-02 NOTE — Telephone Encounter (Signed)
Yes this is fine to do home dupixent

## 2020-06-02 NOTE — Telephone Encounter (Signed)
Could you guys check to see if his insurance requires a new PA for Dupixent pen? Thank you!

## 2020-06-02 NOTE — Progress Notes (Signed)
All questions were answered by the patient before medication was administered. Have you been hospitalized in the last 10 days? No Do you have a fever? No Do you have a cough? No Do you have a headache or sore throat? No  

## 2020-06-03 MED ORDER — DUPIXENT 300 MG/2ML ~~LOC~~ SOAJ: 300.0000 mg | SUBCUTANEOUS | 12 refills | Status: DC

## 2020-06-03 NOTE — Telephone Encounter (Signed)
Rx has been sent in for Dupixent pen.

## 2020-06-03 NOTE — Telephone Encounter (Signed)
Attempted to initiate PA through [facility] for [place] and received message that medication is covered under previous authorization: This medication or product was previously approved on [id number] from 2020-04-27 to 2021-04-27  (Key: [id number])

## 2020-06-14 ENCOUNTER — Telehealth: Payer: Self-pay | Admitting: Internal Medicine

## 2020-06-14 NOTE — Telephone Encounter (Signed)
Called [facility] to set up [place] delivery. Call was transferred to [facility], Pharmacist to clarify and give ok to dispense Dupixent pen for self administration. Was advised to wait 24 hours to schedule shipment. Will call back to set up delivery for Patient.

## 2020-06-16 NOTE — Telephone Encounter (Signed)
Dupixent Order: 300mg  #2 pen Ordered Date: 06/16/2020 Expected date of arrival: 06/18/2020 Ordered by: Desmond Dike, [place]  Specialty Pharmacy: [facility]

## 2020-06-18 NOTE — Telephone Encounter (Signed)
Dupixent Shipment Received: 300mg  #2 pen Medication arrival date: 06/18/2020 Lot #: [id number] Exp date: 01/2022 Received by: Desmond Dike, [place]

## 2020-06-23 ENCOUNTER — Ambulatory Visit (INDEPENDENT_AMBULATORY_CARE_PROVIDER_SITE_OTHER): Payer: 59

## 2020-06-23 ENCOUNTER — Other Ambulatory Visit: Payer: Self-pay

## 2020-06-23 DIAGNOSIS — J4550 Severe persistent asthma, uncomplicated: Secondary | ICD-10-CM | POA: Diagnosis not present

## 2020-06-23 MED ORDER — DUPILUMAB 300 MG/2ML ~~LOC~~ SOSY
300.0000 mg | PREFILLED_SYRINGE | Freq: Once | SUBCUTANEOUS | Status: AC
Start: 2020-06-23 — End: 2020-06-23
  Administered 2020-06-23: 300 mg via SUBCUTANEOUS

## 2020-06-23 NOTE — Progress Notes (Signed)
All questions were answered by the patient before medication was administered. Have you been hospitalized in the last 10 days? No Do you have a fever? No Do you have a cough? No Do you have a headache or sore  throat? No   Patient has been trained on how to self administer Dupixent at home. Technique was shown with a demonstrator pen several times to make patient comfortable with technique. Patient was then asked to demonstrate the technique back to me. Technique was performed properly by the patient. Patient will call if they have an issues with their first time administering at home.

## 2020-07-31 ENCOUNTER — Telehealth: Payer: Self-pay | Admitting: Internal Medicine

## 2020-07-31 NOTE — Telephone Encounter (Signed)
[facility] has shipped 2 bottles of Singulair to office, instead of Patient's home address. Called and left message for Patient to come by office next week and pick up. Singulair - 2 bottles combined in 1 [facility] white bag, on injection room desk.

## 2020-08-10 NOTE — Telephone Encounter (Signed)
ATC Patient.  LM to let Patient know Singulair was delivered to office address.  Patient can stop by and pick at his convenience.

## 2020-08-18 ENCOUNTER — Other Ambulatory Visit: Payer: Self-pay

## 2020-08-18 ENCOUNTER — Ambulatory Visit (INDEPENDENT_AMBULATORY_CARE_PROVIDER_SITE_OTHER): Payer: 59 | Admitting: Internal Medicine

## 2020-08-18 ENCOUNTER — Encounter: Payer: Self-pay | Admitting: Internal Medicine

## 2020-08-18 VITALS — BP 100/80 | HR 54 | Temp 98.0°F | Ht 71.0 in | Wt 185.0 lb

## 2020-08-18 DIAGNOSIS — Z23 Encounter for immunization: Secondary | ICD-10-CM

## 2020-08-18 DIAGNOSIS — J4550 Severe persistent asthma, uncomplicated: Secondary | ICD-10-CM | POA: Diagnosis not present

## 2020-08-18 DIAGNOSIS — Z7189 Other specified counseling: Secondary | ICD-10-CM

## 2020-08-18 DIAGNOSIS — D7219 Other eosinophilia: Secondary | ICD-10-CM | POA: Diagnosis not present

## 2020-08-18 LAB — SARS-COV-2 IGG: SARS-COV-2 IgG: 5.85

## 2020-08-18 NOTE — Patient Instructions (Addendum)
ICD-10-CM   1. Severe persistent asthma without complication  W65.68   2. 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 Flu vaccine need  Z23   4. Advice given about COVID-19 virus infection  Z71.89    Doing well with dupixent Asthma well controlled  Plan  - flu shot 08/18/2020  =- covid IgG blood test 08/18/2020 - stop singulair  - continue breo and dupixent per sschedule  Followup  - 6 months do feno test  - return in 6 months or sooner if needed   - acq at followup - if doing well will cut down on breo or dupixent dosing

## 2020-08-18 NOTE — Addendum Note (Signed)
Addended by: Lia Foyer R on: 08/18/2020 12:25 PM   Modules accepted: Orders

## 2020-08-18 NOTE — Addendum Note (Signed)
Addended by: Suzzanne Cloud E on: 08/18/2020 09:36 AM   Modules accepted: Orders

## 2020-08-18 NOTE — Progress Notes (Signed)
Subjective:     Patient ID: Seth Kerr, male   DOB: 1971-09-26, 49 y.o.   MRN: [id number]  HPI    IOV 06/12/2016  Chief Complaint  Patient presents with  . PULMONARY CONSULT    Referred by Dr. Mosetta Anis. Pt reports increasing breathing issues since moving to [place] 6 years ago. Pt had recent pna in April. Pt c/o continued SOB and chest tightness that is worse with exertion and some cough at night. Pt denies cough. Pt is on Breo and albuterol HFA. Pt is also on oral prednisone taper currently. Pt has significant second hand smoke exposure. Pt is on allergy injections weekly.     49 year old male works at [employer]. He is been referred by Dr. Donneta Romberg for evaluation of shortness of breath in the setting of confirmed diagnosis of asthma and allergies  History is gained from talking to the patient and also review of the referral notes. He tells me that he worked in the [employer] area. When he lived there he had no prior diagnosis of asthma respirator complaints. Moved to [facility] approximately 6 years ago and then within several months started noticing insidious onset of shortness of breath and chest tightness. For the last 4-5 years he has been seeing Dr. Donneta Romberg with a confirmed diagnosis of asthma. He was then started on inhaled steroids and Singulair. With this he also had allergy testing which was positive. He says once his treatments commenced he was able to return to normal activities of daily living including running long distances. He felt well. Approximately one year ago he stopped his allergy shots. Then in April 2017 he says he had a pneumonia for which she was treated with antibiotics. There is a chest x-ray report from 03/08/2016 that reports a nodular focus in the left lower lobe which could her percent focus of pneumonia but could not exclude a pulmonary nodule. I personally visualized this film and agree. He had follow-up chest x-ray 05/05/2016 that  showed improvement. He says that he was beginning to feel better but then started having nocturnal symptoms again with shortness of breath. Shortness of breath is exertional. His been going on for the last few weeks. He is now unable to climb stairs without feeling shortness of breath. Currently is in the middle of a prednisone taper  In terms of his allergies: He had repeat allergy testing April 2017 and it is positive for different environmental allergens. He has now been restarted on allergy shots.  He has had spirometry with Dr. Donneta Romberg. In 07/21/2015 FEV1 1.44 L/35% with a ratio 68 showing severe obstruction. Follow-up 03/08/2016 on the time of pneumonia FEV1 2.2 L/53% with a ratio of 79 with a 13% bronchodilator response. Again showing obstruction of moderate degree. He had a repeat spirometry 03/17/2016 which is only prebronchodilator that showed further improvement FEV1 2.68 L/64% ratio 79. And then again 06/05/2016 he had no further improvement with a post bronchodilator response FEV1 of 2.5 L/61 ratio 80 which is a 15% bronchi were to response. Exhaled nitric oxide on the same day was elevated at 59 ppb. Due to obstructive nature of the spirometry is been referred here.  Walking desaturation test on an 85 feet 3 laps on room at: Did not desaturate below 97%. NOTED BRADYCARDIA - HR 60s entire walk- walked fast  Exhaled nitric oxide today in our office: 26 ppb and normal (was 59 on 06/05/16 @ Dr Donneta Romberg in office )  Noted: he is in middle of a 12d prednisone taper - 5 days left    has a past medical history of Allergic rhinitis, cause unspecified; Asthma; Erectile dysfunction; Irritable bowel syndrome (IBS); and Onychomycosis.   reports that he has never smoked. He has never used smokeless tobacco.   PFT 06/15/16  showed an FEV1 at 93%, ratio 83, FVC 88%, no significant bronchodilator response. Positive mid flow reversibility. DLCO 129%     07/27/2016 Acute OV  : Asthma  Patient returns  for an acute office visit. Complains for last 1.5 weeks that he has dry cough , wheezing, tightness , and dyspnea. Increased SABA use.  Patient was seen for pulmonary consult 06/15/2016 for asthma evaluation. Patient was having active asthma flare and was on prednisone taper. Marland Kitchen He was set up for a pulmonary function test on 06/15/2016 that showed significant improvement in lung function with an FEV1 at 93%, ratio 83% and no significant bronchodilator response. Previous spirometry had showed significant airflow obstructions with FEV1 ranging from 35-53%. It was felt that his lung function improved because of prednisone c/w asthma . He was being followed by Dr. Donneta Romberg , Allergist. He was previously on allergy vaccines but was stopped due to persistent asthma flares. Says he had no breathing problems until he moved here from [facility] 6 yrs ago. Previously ran marathons .  Exhaled nitric oxide test was 26. Previously had been 46. Today FENO is 87  - 07/27/16 Chart review shows very high eosinophils . CXR in April 2017 showed LLL nodular focus ? Pna .  Follow up cxr showed improvement in June.  Spiriva was added to his BREO last ov.    08/16/2016 Acute OV p  : Asthma  Patient returns for an acute office visit for persistent wheezing and cough .  Seen  2 weeks ago for asthma flare. Started on Steroid taper .  Felt some better but never stopped wheezing.  Labs showed IgE at, 254 + rast-tree/grass.  , High eosinophils on Diff , FeNO was elevated at 87  CXR showed mild opacity at left lung base.  CT chest done polylobar areas of scarring likely from previous pna, mild nodularity along lingula ? Scarring. No ILD changes.  He remains on BREO , Spiriva, Singulair , Xyzal and Nasonex .  Cough is keeping him up at night . Denies fever, chest pain, orthopnea , edema or hemoptysis, or discolored mucus.   Patient was seen for pulmonary consult 06/15/2016 for asthma evaluation. Patient was having active asthma flare  and was on prednisone taper. Marland Kitchen He was set up for a pulmonary function test on 06/15/2016 that showed significant improvement in lung function with an FEV1 at 93%, ratio 83% and no significant bronchodilator response. Previous spirometry had showed significant airflow obstructions with FEV1 ranging from 35-53%. It was felt that his lung function improved because of prednisone c/w asthma . He was being followed by Dr. Donneta Romberg , Allergist. He was previously on allergy vaccines but was stopped due to persistent asthma flares. Says he had no breathing problems until he moved here from [facility] 6 yrs ago. Previously ran marathons .     OV 08/23/2016  Chief Complaint  Patient presents with  . 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 Since then he had a CT chest and allergy evaluation. What is significant is that he is significantly high used to this. CT chest is clean. He's not had CT sinus but he does not have sinus problems. His exhaled nitric oxide has been elevated during exacerbations. He saw nurse practitioner in the interim early September 2017 was given prednisone. When he came back again in late September 2017 which was one week ago with another exacerbation and again given prednisone. Visit lasted prednisone and is here for follow-up. He feels that he is largely better. He is worried that he needs prednisone repeatedly. He is not sure that he can manage when he comes off the prednisone but he is not a try. His daughter has similar problems after moving from [place] to [place] and she is on Xolair and has good control. Therefore he is questions on this. He says he is compliant with his inhalers.  Asthma control question test: This is a 4 week questionnaire but I asked him how he felt  in the last 2 days. He feels most of the time his asthma prevents him from getting his work done.Marland Kitchen He feels dyspneic more than once a day. He wakes up frequently at night. He is using albuterol rescue one or 2 times daily. He feels his asthma is poorly controlled despite d6 of prednisone and chest very tight   Exhaled nitric oxide today: While on prednisone 6th day is elevated at 80pbo    09/19/2016 Acute OV p  : Asthma  Patient returns for an acute office visit for persistent wheezing and cough .  Seen  3 weeks ago for asthma flare. Started on medrol dose pack and zpack . Felt some better for couple of weeks then symptoms returned with dry cough and wheezing  Increased SABA use for last 2-3 days.  FeNO today is 56 . (no steroids for 2 weeks) .    49 yo male Never smoker seen for pulmonary consult 06/12/2016 for shortness of breath and asthma evaluation.   PFT 06/15/16  showed an FEV1 at 93%, ratio 83, FVC 88%, no significant bronchodilator response. Positive mid flow reversibility. DLCO 129% 07/27/16  IgE at, 254 + rast-tree/grass.  , High eosinophils on Diff , FeNO was elevated at 87  92017 CT chest done polylobar areas of scarring likely from previous pna, mild nodularity along lingula ? Scarring. No ILD changes.     He remains on BREO , Spiriva, Singulair , Xyzal and Nasonex .  Nucala paperwork has been started. Awaiting approval . We checked on process, waiting on PA .  Denies fever, chest pain, orthopnea , edema or hemoptysis, or discolored mucus.   Patient was seen for pulmonary consult 06/15/2016 for asthma evaluation. Patient was having active asthma flare and was on prednisone taper. Marland Kitchen He was set up for a pulmonary function test on 06/15/2016 that showed significant improvement in lung function with an FEV1 at 93%, ratio 83% and no significant bronchodilator response. Previous spirometry had showed significant airflow obstructions with FEV1 ranging from 35-53%. It was felt that his  lung function improved because of prednisone c/w asthma . He was being followed by Dr. Donneta Romberg , Allergist. He was previously on allergy vaccines but was stopped due to persistent asthma flares. Says he had no breathing problems until he moved here from [facility] 6 yrs ago. Previously ran marathons .   OV 10/24/2016  Chief Complaint  Patient presents with  . Follow-up  Pt saw TP on 10.31.17 for an acute visit. Pt c/o nonprod cough with intermittent chest congestion, increase in SOB. Pt deneis CP/tightness and f/c/s. Pt states he feels he worsens when he comes off the prednisone.      Follow-up for eosinophilic asthma  He still waiting for his interleukin-5 receptor antibody treatment to start. It is currently been approved for the shipment is pending. He tells me that he is unable to come off prednisone completely. Currently he is off prednisone for 2 weeks and he is insidious exacerbation with increased nocturnal symptoms, daily albuterol use multiple times and wheezing and chest tightness. He feels he needs another prednisone. He does not want to do daily prednisone. Preference is to start Nucala and see if he can avoid prednisone. He is frustrated    has a past medical history of Allergic rhinitis, cause unspecified; Asthma; Erectile dysfunction; Irritable bowel syndrome (IBS); and Onychomycosis.   reports that he has never smoked. He has never used smokeless tobacco.  OV 12/25/2016  Chief Complaint  Patient presents with  . Follow-up    Pt states since being on the Nucala his cough has improved but states his SOB has worsened and is using his nebs more. Pt states he has upper left chest pain infrequently.    Fu eos ashma  - s/p nucala start 11/08/2016/ Positive IgE at 284 and significant allergy test positive but currently not on allergy shots  He's completed 2 doses of monthly interleukin-5 receptor antibody treatment for asthma (nucal he says this is improved his cough). He also tells  me that he has not been on prednisone since strting nucala but according to chart review he did take one episode of prednisone January 2018. He does not recollect this. He tells me that cough is significantly improved since starting  nucala but he still gets episodes of chest tightness particularly wakes him up from sleeping. He is using albuterol rescue 3 times a day which she thinks is a little more than his baseline is no fever or sputum production is not any allergy shots. He tells me is compliant with the Brio and Singulair and Spiriva     exhaled nitric oxide today is elevated at 80 ppb   he does have a lung nodule from sept 2017  Arabi 03/05/2017  Chief Complaint  Patient presents with  . Follow-up    2 month follow up after CT scan and blood work. States he is not coughing at much as he used to.      Follow-up very poorly controlled asthma despite compliance associated with high IgE and I eosinophilia.  He is on interleukin-5 receptor antibody injectable therapy. He says this is only helped his cough but overall there is no improvement and shortness of breath the frequency of asthma exacerbations her sense of well-being are similar to scale down his Brio, Singulair and Spiriva treatment. He just does not feel optimized. He feels he is wheezing all the time in fact today also is wheezing. He wants a 5 day prednisone. Most recently had CT chest that did not show lung nodule and ruled out ABPA. His blood eosinophilic count is improved with injectable therapy      OV 06/26/2017  Chief Complaint  Patient presents with  . Follow-up    Pt states he is not on Xolair yet. Pt states he feels he is doing well. Pt states he only uses his albuterol hfa once a month. Pt denies SOB, cough, CP/tightness, f/c/s.  Follow-up allergy poorly controlled asthma with high eosinophils and high IgE  He is on Spiriva, inhaled corticosteroids and long-acting beta agonist and Singulair. He is also on  interleukin-5 receptor antibody nucala. Despite this treatment he has had poorly controlled asthma despite the reported compliance. We then decided to introduce Xolair as an add-on therapy. We did have discussions with him about the unknown safety and efficacy data about doing combination. I did talk to 1 national experts or regional experts. I then we'll change the plan to rotate him to Xolair and stop the interleukin-5 receptor antibody. All this has taken some months. In the interim his asthma is continued to do better. He likes the interleukin-5 receptor antibody and therefore he does not want to stop it currently feels his asthma is well controlled. His last prednisone was in April 2018. He currently is working out in [employer] and able to swim and run. His daughter is on Xolair therapy which works well for her.  Of note his exhaled nitric oxide continues to be high and it is unclear why   OV 12/25/2017  Chief Complaint  Patient presents with  . Follow-up    Pt states he has been doing good. Nucala injections have been helping. Pt states he has had some flareups and had to use his rescue inhaler twice yesterday, 12/24/17 and did two neb treatments. Pt has complaints of chest tightness and SOB but not much of a cough.    49 year old male with allergic asthma with eosinophilia and elevated IgE.  On Spiriva, Brio, Singulair and interleukin-5 receptor antibody injections monthly Nucala  Overall he says his health status is improved because of the biologic injections against asthma but he still having frequent breakthrough exacerbations requiring prednisone.  Few months ago we tried to switch him to Xolair but then he wanted to hold off.  At this point in time he is willing to try another biological.  Today he says that he is got increased asthma exacerbation with increased wheezing and shortness of breath and chest tightness.  Asthma control questionnaire is 3 showing that he is waking up a few times at  night and when he wakes up he has moderate symptoms.  His activities are slightly limited because of asthma and is short of breath quite a lot and is wheezing a lot of the time and is using albuterol for rescue 1-2 puffs.  He works in [employer] and does 2 weeks of night shifts and 2 weeks of day shifts.  The night shift or harderAnd is wondering if this is because of diurnal variation of asthma   OV 04/26/2018  Chief Complaint  Patient presents with  . Follow-up    57-month follow up. Pt states he has been doing well since last visit and denies any complaints. Pt is on dupixent and is doing great.   Seth Kerr , 49 y.o. , with dob [birth year] and male ,Not Hispanic or Latino from [address] [place] [address] - presents to lung clinic for difficult to control asthm with blood eos and high IgE.  Last visit was in February 2019.  At that time we switched his Nucala to dupilumab.  He has been taking diplomat now for the last 2 to 3 months.  He says that there is the best ever he has been in his entire life from asthma.  His asthma control question is now 0.  He was able to bike 50 miles in the heat.  He was able to run also for miles.  He has not used albuterol in many months.  His last prednisone was many months ago.  He is not waking up in the middle of the night with symptoms his chest does not feel tight when he wakes up.  He is not limited to do all in his activities because of asthma he is not having any shortness of breath or wheezing or cough because of asthma.  At this point in time he is taking Breo, Spiriva, Singulair but has run out of his antihistamine.  He is asking for further reduction in his therapy.  His exam nitric oxide is 8 and it has never been normal in  the entire time that I have seen him.   OV 08/18/2020   Subjective:  Patient ID: Seth Kerr, male , DOB: 11/07/1971, age 27 y.o. years. , MRN: [id number],  ADDRESS: [address] [place] [address] PCP  Rita Ohara, MD Providers : Treatment Team:  Attending Provider: Brand Males, MD   Chief Complaint  Patient presents with  . Follow-up    Feeling good overall no concerns at this time       HPI Dnaiel Voller 49 y.o. -personally not seen in over 2 years.  Asthma follow-up.  Currently on Dupixent doing really well.  Also taking Breo and Singulair.  Asthma is well controlled.  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  He feels that good.  Albuterol use is nonexistent ACT score is really good showing good control.    He has had his Covid vaccine.  He did have COVID-19 in November 2020.  He wants to have antibody test.  We discussed limitations and interpretation of antibody test.    He will have a flu shot today. -He is in need of 1.     ACT SCore since 2021          25  acq 5 point score        3 0    Ref. Range 06/12/2016 16:09 07/27/2016 16:55 08/23/2016 16:14 09/19/2016 09:30 12/25/2016 09:00 06/26/2017  12/25/2017  04/26/2018  08/18/2020   Nitric Oxide Unknown 26 87 80 56 84 84 90 8 x  treatment      On nucala since dec 2017 nucaka nucala Firs followup on dupixent x 2-3 months      Asthma Control Test ACT Total Score  08/18/2020 25    Lab Results  Component Value Date   NITRICOXIDE 90 12/25/2017      ROS - per HPI     has a past medical history of Allergic rhinitis, cause unspecified, Asthma, COVID-19 virus infection (09/29/2019), Erectile dysfunction, Irritable bowel syndrome (IBS), and Onychomycosis.   reports that he has never smoked. He has never used smokeless tobacco.  Past Surgical History:  Procedure Laterality Date  . SEPTOPLASTY  approx age 33  . SHOULDER SURGERY Right   . TONSILLECTOMY  age 49    Allergies  Allergen Reactions  . Aspirin Other (See Comments)    Not actual allergy, had chronic nosebleeds as a child and was instructed not to use aspirin.    Immunization History  Administered Date(s) Administered  . Influenza Split 09/12/2012, 08/29/2014, 09/05/2016   . Influenza,inj,Quad PF,6+ Mos 10/09/2013, 09/03/2015, 08/29/2017, 08/28/2018, 08/25/2019  . Pneumococcal Polysaccharide-23 05/05/2015  . Tdap 10/04/2010, 04/22/2014    Family History  Problem Relation Age of Onset  . Cancer Mother        lung cancer,  smoker  . Alcohol abuse Mother   . Cancer Father        stomach cancer, smoker  . Alcohol abuse Father   . Asthma Daughter   . Diabetes Neg Hx   . Heart disease Neg Hx      Current Outpatient Medications:  .  albuterol (PROVENTIL) (2.5 MG/3ML) 0.083% nebulizer solution, Take 3 mLs (2.5 mg total) by nebulization every 6 (six) hours as needed for wheezing or shortness of breath., Disp: 75 mL, Rfl: 11 .  BREO ELLIPTA 200-25 MCG/INH AEPB, INHALE 1 INHALATION DAILY, Disp: 60 each, Rfl: 5 .  Cholecalciferol (VITAMIN D) 50 MCG (2000 UT) CAPS, Take 2,000 Units by mouth daily., Disp: , Rfl:  .  Dupilumab (DUPIXENT) 300 MG/2ML SOPN, Inject 300 mg into the skin every 14 (fourteen) days., Disp: 2 pen, Rfl: 12 .  EPIPEN 2-PAK 0.3 MG/0.3ML SOAJ injection, Use as directed, Disp: 1 each, Rfl: 0 .  mometasone (NASONEX) 50 MCG/ACT nasal spray, Place 2 sprays into the nose 2 (two) times daily. Reported on 05/17/2016, Disp: 17 g, Rfl: 4 .  montelukast (SINGULAIR) 10 MG tablet, TAKE 1 TABLET BY MOUTH AT  BEDTIME, Disp: 90 tablet, Rfl: 3 .  predniSONE (DELTASONE) 10 MG tablet, 4 tabs X2 day, 2 tab X2 day, 1 tab X2 day, 0.5 tab X2 day, Disp: 15 tablet, Rfl: 0 .  PROAIR RESPICLICK 626 (90 Base) MCG/ACT AEPB, Inhale 2 puffs into the lungs every 4 (four) hours as needed., Disp: 1 each, Rfl: 5 .  sildenafil (VIAGRA) 100 MG tablet, Take 0.5-1 tablets (50-100 mg total) by mouth daily as needed for erectile dysfunction., Disp: 5 tablet, Rfl: 11      Objective:   Vitals:   08/18/20 0906  BP: 100/80  Pulse: (!) 54  Temp: 98 F (36.7 C)  TempSrc: Temporal  SpO2: 97%  Weight: 185 lb (83.9 kg)  Height: 5\' 11"  (1.803 m)    Estimated body mass index is 25.8  kg/m as calculated from the following:   Height as of this encounter: 5\' 11"  (1.803 m).   Weight as of this encounter: 185 lb (83.9 kg).  @WEIGHTCHANGE @  [employer]   08/18/20 0906  Weight: 185 lb (83.9 kg)     Physical Exam General: No distress. Fit Neuro: Alert and Oriented x 3. GCS 15. Speech normal Psych: Pleasant Resp: Clear to ausucultation bilaterally. No wheeze No crackles CVS: Normal heart sounds. Murmurs - no HEENT: Normal upper airway. PEERL +. No post nasal drip          Assessment:       ICD-10-CM   1. Severe persistent asthma without complication  R48.54   2. 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 Flu vaccine need  Z23   4. Advice given about COVID-19 virus infection  Z71.89        Plan:     Patient Instructions     ICD-10-CM   1. Severe persistent asthma without complication  O27.03   2. 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 Flu vaccine need  Z23   4. Advice given about COVID-19 virus infection  Z71.89    Doing well with dupixent Asthma well controlled  Plan  - flu shot 08/18/2020  =- covid IgG blood test 08/18/2020 - stop singulair  - continue breo and dupixent per sschedule  Followup  - 6 months do feno test  - return in 6 months or sooner if needed   - acq at followup -  if doing well will cut down on breo or dupixent dosing     SIGNATURE    Dr. Brand Males, M.D., F.C.C.P,  Pulmonary and Critical Care Medicine Staff Physician, [facility] Director - Interstitial Lung Disease  Program  Pulmonary [place] at [place], [place], [address]  Pager: [phone], If no answer or between  15:00h - 7:00h: call [phone] Telephone: [phone]  9:23 AM 08/18/2020

## 2020-08-18 NOTE — Addendum Note (Signed)
Addended by: Lia Foyer R on: 08/18/2020 09:29 AM   Modules accepted: Orders

## 2020-08-18 NOTE — Addendum Note (Signed)
Addended by: Lia Foyer R on: 08/18/2020 12:23 PM   Modules accepted: Orders

## 2020-08-18 NOTE — Telephone Encounter (Signed)
Patient came to office today and picked up Singulair. Nothing further at this time.

## 2020-08-23 NOTE — Progress Notes (Signed)
He still has positive covid antibody response. So his immediate defense to covid is still good

## 2020-09-04 ENCOUNTER — Other Ambulatory Visit: Payer: Self-pay | Admitting: Primary Care

## 2020-09-13 ENCOUNTER — Encounter: Payer: Self-pay | Admitting: Family Medicine

## 2020-09-13 ENCOUNTER — Telehealth (INDEPENDENT_AMBULATORY_CARE_PROVIDER_SITE_OTHER): Payer: 59 | Admitting: Family Medicine

## 2020-09-13 VITALS — Temp 98.4°F | Wt 182.0 lb

## 2020-09-13 DIAGNOSIS — R059 Cough, unspecified: Secondary | ICD-10-CM

## 2020-09-13 DIAGNOSIS — J3489 Other specified disorders of nose and nasal sinuses: Secondary | ICD-10-CM

## 2020-09-13 DIAGNOSIS — J4550 Severe persistent asthma, uncomplicated: Secondary | ICD-10-CM

## 2020-09-13 DIAGNOSIS — J302 Other seasonal allergic rhinitis: Secondary | ICD-10-CM

## 2020-09-13 MED ORDER — BENZONATATE 200 MG PO CAPS: 200.0000 mg | ORAL_CAPSULE | Freq: Three times a day (TID) | ORAL | 0 refills | Status: DC | PRN

## 2020-09-13 NOTE — Progress Notes (Signed)
Start time: 12:12 End time: 12:44  Virtual Visit via Video Note  I connected with Seth Kerr on 09/13/20 by a video enabled telemedicine application and verified that I am speaking with the correct person using two identifiers.  Location: Patient: home Provider: office   I discussed the limitations of evaluation and management by telemedicine and the availability of in person appointments. The patient expressed understanding and agreed to proceed.  History of Present Illness:  Chief Complaint  Patient presents with  . sinus infection    sinus infection x week. gums hurting, coughing, runny nose saturday, no fever. no covid test recently   Symptoms started 10 days ago, shortly after doing yardwork. He started Allegra the next day Eyes got pink, was prescribed a gel (e-visit). Eyes got better quickly. He also had nasal congestion, cough, sore gums, cheeks, sore between his eyes. Mucus was mostly clear, not a lot of drainage. He had a little bit of a dry cough. Did a breathing treatment that Monday or Tuesday due to a slight cough. No worsening of asthma or shortness of breath.  He started using Nasacort OTC nasal steroid 5 days ago, using about every other day. He started a course of prednisone Wednesday (he had leftover from when he had COVID last year, didn't need).  Took 2 BID x 3 days, then 1 BID x 5 days. Finished it yesterday.  Doesn't think it helped. No change in sinus pressure, cough.  Still coughing, mainly dry, annoying cough.   No change in symptoms--still has discomfort in his gums, cheeks and cough. No fever. No sick contacts.  He has h/o COVID, and s/p immunization.  He reports he had antibody levels checked a few weeks ago and were still +.  He did 1/2 [place] in [place] this past weekend.  Nose ran a lot while biking.  Occasional cough while running, no shortness of breath. Some increased cough at the end, not out of breath. Overall he felt pretty  good.  PMH, PSH, SH reviewed  Outpatient Encounter Medications as of 09/13/2020  Medication Sig Note  . BREO ELLIPTA 200-25 MCG/INH AEPB INHALE 1 INHALATION DAILY   . Cholecalciferol (VITAMIN D) 50 MCG (2000 UT) CAPS Take 2,000 Units by mouth daily.   . Dupilumab (DUPIXENT) 300 MG/2ML SOPN Inject 300 mg into the skin every 14 (fourteen) days.   . sildenafil (VIAGRA) 100 MG tablet Take 0.5-1 tablets (50-100 mg total) by mouth daily as needed for erectile dysfunction.   . [DISCONTINUED] PROAIR RESPICLICK 614 (90 Base) MCG/ACT AEPB Inhale 2 puffs into the lungs every 4 (four) hours as needed.   Marland Kitchen albuterol (PROVENTIL) (2.5 MG/3ML) 0.083% nebulizer solution Take 3 mLs (2.5 mg total) by nebulization every 6 (six) hours as needed for wheezing or shortness of breath. (Patient not taking: Reported on 09/13/2020) 09/13/2020: Did a treatment 3 days, day prior to Iron[employer], not needing since or related to illness  . EPIPEN 2-PAK 0.3 MG/0.3ML SOAJ injection Use as directed (Patient not taking: Reported on 09/13/2020)   . mometasone (NASONEX) 50 MCG/ACT nasal spray Place 2 sprays into the nose 2 (two) times daily. Reported on 05/17/2016 (Patient not taking: Reported on 09/13/2020) 09/13/2020: Using OTC triamcinolone nasal spray 55mg /spray, using about every other day x 5d  . [DISCONTINUED] montelukast (SINGULAIR) 10 MG tablet TAKE 1 TABLET BY MOUTH AT  BEDTIME   . [DISCONTINUED] predniSONE (DELTASONE) 10 MG tablet 4 tabs X2 day, 2 tab X2 day, 1 tab X2 day, 0.5 tab  X2 day 11/03/2019: Rx'd for when he had COVID, didn't need, on hand for prn   No facility-administered encounter medications on file as of 09/13/2020.   Allergies  Allergen Reactions  . Aspirin Other (See Comments)    Not actual allergy, had chronic nosebleeds as a child and was instructed not to use aspirin.   ROS: no loss of smell/aste.  +URI symptoms per HPI. No fever. No shortness of breath or asthma exacerbation.  No GI complaints or other  concerns.  See HPI.   Observations/Objective:  Temp 98.4 F (36.9 C)   Wt 182 lb (82.6 kg)   SpO2 98%   BMI 25.38 kg/m   Pleasant, well-appearing male in no distress No cough/sneeze/throat-clearing during visit.  He is speaking comfortably and appears well. Exam is limited due to virtual nature of the visit.  Assessment and Plan:  Seasonal allergies - continue allegra; to use the nasal steroid daily  Cough - Plan: benzonatate (TESSALON) 200 MG capsule  Sinus pain - decongestant, sinus rinses, guaifenesin. If not improving, or if purulent drainage, start ABX  Severe persistent asthma without complication - doing very well on [place]    Stay well hydrated. Use guaifenesin (ie the 12 hour version in Mucinex that we discussed, versus shorter-acting ones and follow the directions) and decongestant (pseudoephedrine or phenylephine) to help loosen the mucous, shrink down the sinus swelling, to allow drainage and help decrease sinus pain. We also discussed using sinus rinses or neti-pot to help with sinus pain. If you find that your mucus is discolored, and symptoms are NOT improving, then we should start an antibiotic. Use the triamcinolone nasal spray every day. We are sending in a prescription for benzonatate--a pill that should help some with the annoying dry cough (you may not need to use this long, just until the other measures start helping).   Follow Up Instructions:    I discussed the assessment and treatment plan with the patient. The patient was provided an opportunity to ask questions and all were answered. The patient agreed with the plan and demonstrated an understanding of the instructions.   The patient was advised to call back or seek an in-person evaluation if the symptoms worsen or if the condition fails to improve as anticipated.  I provided 32 minutes of video face-to-face time during this encounter.  Additional time was spent in chart review and documentation  (5-10 min).   Vikki Ports, MD

## 2020-09-13 NOTE — Patient Instructions (Signed)
  Stay well hydrated. Use guaifenesin (ie the 12 hour version in Mucinex that we discussed, versus shorter-acting ones and follow the directions) and decongestant (pseudoephedrine or phenylephine) to help loosen the mucous, shrink down the sinus swelling, to allow drainage and help decrease sinus pain. We also discussed using sinus rinses or neti-pot to help with sinus pain. If you find that your mucus is discolored, and symptoms are NOT improving, then we should start an antibiotic. Use the triamcinolone nasal spray every day. We are sending in a prescription for benzonatate--a pill that should help some with the annoying dry cough (you may not need to use this long, just until the other measures start helping).

## 2020-11-10 NOTE — Patient Instructions (Addendum)
  HEALTH MAINTENANCE RECOMMENDATIONS:  It is recommended that you get at least 30 minutes of aerobic exercise at least 5 days/week (for weight loss, you may need as much as 60-90 minutes). This can be any activity that gets your heart rate up. This can be divided in 10-15 minute intervals if needed, but try and build up your endurance at least once a week.  Weight bearing exercise is also recommended twice weekly.  Eat a healthy diet with lots of vegetables, fruits and fiber.  "Colorful" foods have a lot of vitamins (ie green vegetables, tomatoes, red peppers, etc).  Limit sweet tea, regular sodas and alcoholic beverages, all of which has a lot of calories and sugar.  Up to 2 alcoholic drinks daily may be beneficial for men (unless trying to lose weight, watch sugars).  Drink a lot of water.  Sunscreen of at least SPF 30 should be used on all sun-exposed parts of the skin when outside between the hours of 10 am and 4 pm (not just when at beach or pool, but even with exercise, golf, tennis, and yard work!)  Use a sunscreen that says "broad spectrum" so it covers both UVA and UVB rays, and make sure to reapply every 1-2 hours.  Remember to change the batteries in your smoke detectors when changing your clock times in the spring and fall.  Carbon monoxide detectors are recommended for your home.  Use your seat belt every time you are in a car, and please drive safely and not be distracted with cell phones and texting while driving.  I recommend getting the new shingles vaccine (Shingrix).  If you have commercial insurance, check with your insurance to verify what your out of pocket cost may be (usually covered as preventative, but better to verify to avoid any surprises, as this vaccine is expensive), and then schedule a nurse visit at our office when convenient (based on the possible side effects as discussed).   This is a series of 2 injections, spaced 2 months apart.  It doesn't have to be exactly 2  months apart (but can't be under 2 months), if that isn't feasible for your schedule, but try and get them close to 2 months (and definitely within 6 months of each other, or else the efficacy of the vaccine drops off).  You are due for colon cancer screening.  We are referring you to GI. You are due for an eye exam.

## 2020-11-10 NOTE — Progress Notes (Signed)
Chief Complaint  Patient presents with  . Annual Exam    Fasting annual exam, no concerns.   . Immunizations    Would like to hold off on covid booster at the moment due to negative side effects from shot #2. Had covid infection 08/2019.    Seth Kerr is a 49 y.o. male who presents for a complete physical.    He had very high fever with 2nd [place] vaccine.  Hesitant to get the booster just yet.  Had +antibodies on last check. COVID illness was 09/2019.  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 Chase Caller, and is doing extremely well on Dupixent injections q2 weeks.He is self-administering the injections. He still has Breo, but stopped taking it (he made this decision on his own, though states they had discussed possibly trying this), and has been doing very well.  He needed albuterol in October when sick, otherwise no rescue medication needed.  He remains very active with running, some biking, and hasn't had any problems.  Allergies: He no longer sees Dr. Donneta Romberg, no longer getting immunotherapy. Allergies flared in October.  No longer on Montelukast (stopped by Dr. Chase Caller at last visit).  Takes Nasonex seasonally only not needing now.  ED comes and goes. Has used Viagra in the past with good results.  Requesting refills.  Sometimes he feels like he doesn't empty his bladder well--either takes a long time, or has to go again shortly after. Unchanged. Gets up once at night to void. 2 cups/coffee daily, and drinks a lot of water, so goes frequently during the day.  Immunization History  Administered Date(s) Administered  . Influenza Split 09/12/2012, 08/29/2014, 09/05/2016  . Influenza,inj,Quad PF,6+ Mos 10/09/2013, 09/03/2015, 08/29/2017, 08/28/2018, 08/25/2019, 08/18/2020  . PFIZER SARS-COV-2 Vaccination 01/21/2020, 02/11/2020  . Pneumococcal Polysaccharide-23 05/05/2015  . Tdap 10/04/2010, 04/22/2014   Last colonoscopy: done at approx age  [age], and another one age 30. Reportedly normal. Diagnosed with IBS.  Last was done in [place]. Last PSA: done through work once (at age 34) Dentist: twice yearly Optho: 2 years ago, wears glasses Exercise: 5 days/week or more, bikes, runs, walks (swimming when training for triathlons).  Push-ups, body-weight bearing exercise. No longer using other weights. Did 1/2 Ironman in 08/2020.  Plans to   Lipids: Lab Results  Component Value Date   CHOL 212 (H) 09/25/2018   HDL 83 09/25/2018   LDLCALC 117 (H) 09/25/2018   TRIG 59 09/25/2018   CHOLHDL 2.6 09/25/2018   Vitamin D normal at 44 in 2015 Normal thyroid screen 2015  PMH, PSH, SH and FH were reviewed and updated  Outpatient Encounter Medications as of 11/11/2020  Medication Sig Note  . Cholecalciferol (VITAMIN D) 50 MCG (2000 UT) CAPS Take 2,000 Units by mouth daily.   . Dupilumab (DUPIXENT) 300 MG/2ML SOPN Inject 300 mg into the skin every 14 (fourteen) days.   Marland Kitchen albuterol (PROVENTIL) (2.5 MG/3ML) 0.083% nebulizer solution Take 3 mLs (2.5 mg total) by nebulization every 6 (six) hours as needed for wheezing or shortness of breath. (Patient not taking: No sig reported) 09/13/2020: Did a treatment 3 days, day prior to [employer], not needing since or related to illness  . BREO ELLIPTA 200-25 MCG/INH AEPB INHALE 1 INHALATION DAILY (Patient not taking: Reported on 11/11/2020)   . EPIPEN 2-PAK 0.3 MG/0.3ML SOAJ injection Use as directed (Patient not taking: No sig reported)   . mometasone (NASONEX) 50 MCG/ACT nasal spray Place 2 sprays into  the nose 2 (two) times daily. Reported on 05/17/2016 (Patient not taking: No sig reported) 09/13/2020: Using OTC triamcinolone nasal spray 54m/spray, using about every other day x 5d  . sildenafil (VIAGRA) 100 MG tablet Take 0.5-1 tablets (50-100 mg total) by mouth daily as needed for erectile dysfunction. (Patient not taking: Reported on 11/11/2020)   . [DISCONTINUED] benzonatate (TESSALON) 200 MG  capsule Take 1 capsule (200 mg total) by mouth 3 (three) times daily as needed for cough.    No facility-administered encounter medications on file as of 11/11/2020.   Allergies  Allergen Reactions  . Aspirin Other (See Comments)    Not actual allergy, had chronic nosebleeds as a child and was instructed not to use aspirin.    ROS: The patient denies anorexia, headaches,vision loss, decreased hearing, ear pain, hoarseness, palpitations, dizziness, syncope, dyspnea on exertion, cough, swelling, nausea, vomiting, diarrhea, constipation, abdominal pain, melena, hematochezia, indigestion/heartburn, hematuria, incontinence, numbness, tingling, weakness, tremor, suspicious skin lesions, depression, anxiety, abnormal bleeding/bruising, or enlarged lymph nodes ED intermittently. Urinary symptoms per HPI (mild, unchanged) Asthma/allergies are controlled. Regained weight after last triathlon--less exercise, no change in eating, some vacations.   PHYSICAL EXAM:  BP 102/70   Pulse (!) 56   Temp (!) 97.3 F (36.3 C) (Tympanic)   Ht _0  (1.803 m)   Wt 194 lb 12.8 oz (88.4 kg)   BMI 27.17 kg/m   Wt Readings from Last 3 Encounters:  11/11/20 194 lb 12.8 oz (88.4 kg)  09/13/20 182 lb (82.6 kg)  08/18/20 185 lb (83.9 kg)    General Appearance:   Alert, cooperative, no distress, appears stated age  Head:   Normocephalic, without obvious abnormality, atraumatic  Eyes:   PERRL, conjunctiva/corneas clear, EOM's intact, fundi benign  Ears:   Normal TM's and external ear canals  Nose:  Not examined, wearing mask due to COVID-19 pandemic  Throat:  Not examined, wearing mask due to COVID-19 pandemic  Neck:  Supple, no lymphadenopathy; thyroid: noenlargement/tenderness/ nodules; no carotid bruit or JVD  Back:  Spine nontender, no curvature, ROM normal, no CVA tenderness  Lungs:  Respirations unlabored, good air movement. No wheezing, rales, ronchi   Chest Wall:   No tenderness or deformity  Heart:   Regular rate and rhythm, S1 and S2 normal, no murmur, rub or gallop  Breast Exam:   No chest wall tenderness, masses or gynecomastia  Abdomen:   Soft, non-tender, nondistended, normoactive bowel sounds, no masses, no hepatosplenomegaly  Genitalia:   Normal male external genitalia without lesions. Testicles without masses. Small cyst interiorly/posterior to left testicle, nontender. No inguinal hernias.  Rectal:   Normal sphincter tone, no masses or tenderness; heme negative stool. Prostate smooth, no nodules, notenlarged.  Extremities:  No clubbing, cyanosis or edema.   Pulses:  2+ and symmetric all extremities  Skin:  Skin color, texture, turgor normal, no rashes or suspicious lesions.   Lymph nodes:  Cervical, supraclavicular, and axillary nodes normal  Neurologic:  Normal strength, sensation and gait; reflexes 2+ and symmetric throughout   Psych: Normal mood, affect, hygiene and grooming  ASSESSMENT/PLAN:  Annual physical exam - Plan: CBC with Differential/Platelet, Comprehensive metabolic panel, Lipid panel, PSA, Hepatitis C antibody  Screening for prostate cancer - Plan: PSA  Need for hepatitis C screening test - Plan: Hepatitis C antibody  Erectile dysfunction, unspecified erectile dysfunction type - Plan: sildenafil (VIAGRA) 100 MG tablet  Seasonal allergies - not currently flaring; to restart medications in spring  Severe persistent asthma without  complication - well controlled on Dupixent  Colon cancer screening - Plan: Ambulatory referral to Gastroenterology  Vaccine counseling - encouraged COVID booster, discussed in detail. NV when ready, likely SE won't be as severe as w/2nd. Shingrix also rec.  PSA, cbc, c-met, lipids, hepC.   Discussed PSA screening (risks/benefits); recommended at least 30 minutes of aerobic activity at least 5  days/week, weight-bearing exercise at least 2x/week; proper sunscreen use reviewed; healthy diet and alcohol recommendations (less than or equal to 2 drinks/day) reviewed; regular seatbelt use; changing batteries in smoke detectors, carbon monoxide detectors ecommended. Self-testicular exams. Immunization recommendations discussed--continue yearly flu shots. Shingrix recommended, risks/SE reviewed. COVID booster recommended.  Discussed timing. Discussed pneumonia vaccines/boosters.  Rather than give [id number] today, will hold off until Prevnar-20 available. Colonoscopy recommendations reviewed--due now, refer to GI  (last age 46, has had 2, dx IBS)  F/u 1 year for CPE, sooner prn.

## 2020-11-11 ENCOUNTER — Ambulatory Visit (INDEPENDENT_AMBULATORY_CARE_PROVIDER_SITE_OTHER): Payer: 59 | Admitting: Family Medicine

## 2020-11-11 ENCOUNTER — Encounter: Payer: Self-pay | Admitting: Family Medicine

## 2020-11-11 ENCOUNTER — Other Ambulatory Visit: Payer: Self-pay

## 2020-11-11 VITALS — BP 102/70 | HR 56 | Temp 97.3°F | Ht 71.0 in | Wt 194.8 lb

## 2020-11-11 DIAGNOSIS — N529 Male erectile dysfunction, unspecified: Secondary | ICD-10-CM | POA: Diagnosis not present

## 2020-11-11 DIAGNOSIS — Z125 Encounter for screening for malignant neoplasm of prostate: Secondary | ICD-10-CM | POA: Diagnosis not present

## 2020-11-11 DIAGNOSIS — Z7185 Encounter for immunization safety counseling: Secondary | ICD-10-CM

## 2020-11-11 DIAGNOSIS — Z0000 Encounter for general adult medical examination without abnormal findings: Secondary | ICD-10-CM

## 2020-11-11 DIAGNOSIS — Z1159 Encounter for screening for other viral diseases: Secondary | ICD-10-CM

## 2020-11-11 DIAGNOSIS — J4550 Severe persistent asthma, uncomplicated: Secondary | ICD-10-CM

## 2020-11-11 DIAGNOSIS — J302 Other seasonal allergic rhinitis: Secondary | ICD-10-CM

## 2020-11-11 DIAGNOSIS — Z1211 Encounter for screening for malignant neoplasm of colon: Secondary | ICD-10-CM

## 2020-11-11 MED ORDER — SILDENAFIL CITRATE 100 MG PO TABS
50.0000 mg | ORAL_TABLET | Freq: Every day | ORAL | 11 refills | Status: DC | PRN
Start: 2020-11-11 — End: 2021-11-24

## 2020-11-12 LAB — COMPREHENSIVE METABOLIC PANEL
ALT: 23 IU/L (ref 0–44)
AST: 28 IU/L (ref 0–40)
Albumin/Globulin Ratio: 1.7 (ref 1.2–2.2)
Albumin: 4.7 g/dL (ref 4.0–5.0)
Alkaline Phosphatase: 62 IU/L (ref 44–121)
BUN/Creatinine Ratio: 16 (ref 9–20)
BUN: 15 mg/dL (ref 6–24)
Bilirubin Total: 0.8 mg/dL (ref 0.0–1.2)
CO2: 26 mmol/L (ref 20–29)
Calcium: 9.7 mg/dL (ref 8.7–10.2)
Chloride: 100 mmol/L (ref 96–106)
Creatinine, Ser: 0.92 mg/dL (ref 0.76–1.27)
GFR calc Af Amer: 112 mL/min/{1.73_m2} (ref >59)
GFR calc non Af Amer: 97 mL/min/{1.73_m2} (ref >59)
Globulin, Total: 2.7 g/dL (ref 1.5–4.5)
Glucose: 99 mg/dL (ref 65–99)
Potassium: 4.7 mmol/L (ref 3.5–5.2)
Sodium: 139 mmol/L (ref 134–144)
Total Protein: 7.4 g/dL (ref 6.0–8.5)

## 2020-11-12 LAB — CBC WITH DIFFERENTIAL/PLATELET
Basophils Absolute: 0.1 10*3/uL (ref 0.0–0.2)
Basos: 2 %
EOS (ABSOLUTE): 2 10*3/uL — ABNORMAL HIGH (ref 0.0–0.4)
Eos: 29 %
Hematocrit: 46.5 % (ref 37.5–51.0)
Hemoglobin: 16.3 g/dL (ref 13.0–17.7)
Immature Grans (Abs): 0 10*3/uL (ref 0.0–0.1)
Immature Granulocytes: 0 %
Lymphocytes Absolute: 2.2 10*3/uL (ref 0.7–3.1)
Lymphs: 33 %
MCH: 30.9 pg (ref 26.6–33.0)
MCHC: 35.1 g/dL (ref 31.5–35.7)
MCV: 88 fL (ref 79–97)
Monocytes Absolute: 0.8 10*3/uL (ref 0.1–0.9)
Monocytes: 11 %
Neutrophils Absolute: 1.7 10*3/uL (ref 1.4–7.0)
Neutrophils: 25 %
Platelets: 247 10*3/uL (ref 150–450)
RBC: 5.27 x10E6/uL (ref 4.14–5.80)
RDW: 12.7 % (ref 11.6–15.4)
WBC: 6.7 10*3/uL (ref 3.4–10.8)

## 2020-11-12 LAB — LIPID PANEL
Chol/HDL Ratio: 2.1 ratio (ref 0.0–5.0)
Cholesterol, Total: 244 mg/dL — ABNORMAL HIGH (ref 100–199)
HDL: 117 mg/dL (ref >39)
LDL Chol Calc (NIH): 118 mg/dL — ABNORMAL HIGH (ref 0–99)
Triglycerides: 56 mg/dL (ref 0–149)
VLDL Cholesterol Cal: 9 mg/dL (ref 5–40)

## 2020-11-12 LAB — PSA: Prostate Specific Ag, Serum: 1.1 ng/mL (ref 0.0–4.0)

## 2020-11-12 LAB — HEPATITIS C ANTIBODY: Hep C Virus Ab: <0.1 s/co ratio (ref 0.0–0.9)

## 2020-12-24 ENCOUNTER — Telehealth: Payer: Self-pay | Admitting: Internal Medicine

## 2020-12-24 NOTE — Telephone Encounter (Signed)
Called and spoke with Patient.  Patient Singulair was delivered to office from [facility] instead of his home address.  Patient stated he would be be by office the week of 01/03/21 to pick up. Singulair placed in bag at front desk for front desk pick up. Nothing further at this time.

## 2021-01-12 ENCOUNTER — Ambulatory Visit (AMBULATORY_SURGERY_CENTER): Payer: Self-pay | Admitting: *Deleted

## 2021-01-12 ENCOUNTER — Other Ambulatory Visit: Payer: Self-pay

## 2021-01-12 VITALS — Ht 71.0 in | Wt 203.0 lb

## 2021-01-12 DIAGNOSIS — Z1211 Encounter for screening for malignant neoplasm of colon: Secondary | ICD-10-CM

## 2021-01-12 MED ORDER — PLENVU 140 G PO SOLR: 1.0000 | Freq: Once | ORAL | 0 refills | Status: AC

## 2021-01-12 NOTE — Progress Notes (Signed)
Patient is here in-person for PV. Patient denies any allergies to eggs or soy. Patient denies any problems with anesthesia/sedation. Patient denies any oxygen use at home. Patient denies taking any diet/weight loss medications or blood thinners. Patient is not being treated for MRSA or C-diff. Patient is aware of our care-partner policy and Covid-19 safety protocol. EMMI education assigned to the patient for the procedure, sent to MyChart.   Patient is fully COVID-19 vaccinated, per patient.   Prep Prescription coupon given to the patient. 

## 2021-01-26 ENCOUNTER — Encounter: Payer: Self-pay | Admitting: Gastroenterology

## 2021-01-26 ENCOUNTER — Ambulatory Visit (AMBULATORY_SURGERY_CENTER): Payer: 59 | Admitting: Gastroenterology

## 2021-01-26 ENCOUNTER — Other Ambulatory Visit: Payer: Self-pay

## 2021-01-26 VITALS — BP 105/60 | HR 57 | Temp 96.8°F | Resp 10 | Ht 71.0 in | Wt 203.0 lb

## 2021-01-26 DIAGNOSIS — Z1211 Encounter for screening for malignant neoplasm of colon: Secondary | ICD-10-CM

## 2021-01-26 MED ORDER — SODIUM CHLORIDE 0.9 % IV SOLN: 500.0000 mL | Freq: Once | INTRAVENOUS | Status: DC

## 2021-01-26 NOTE — Progress Notes (Signed)
Report to PACU, RN, vss, BBS= Clear.  

## 2021-01-26 NOTE — Patient Instructions (Signed)
NO POLYPS!!!!!!! Resume previous diet Continue current medications Repeat colonoscopy in 10 years  YOU HAD AN ENDOSCOPIC PROCEDURE TODAY AT [facility]:   Refer to the procedure report that was given to you for any specific questions about what was found during the examination.  If the procedure report does not answer your questions, please call your gastroenterologist to clarify.  If you requested that your care partner not be given the details of your procedure findings, then the procedure report has been included in a sealed envelope for you to review at your convenience later.  YOU SHOULD EXPECT: Some feelings of bloating in the abdomen. Passage of more gas than usual.  Walking can help get rid of the air that was put into your GI tract during the procedure and reduce the bloating. If you had a lower endoscopy (such as a colonoscopy or flexible sigmoidoscopy) you may notice spotting of blood in your stool or on the toilet paper. If you underwent a bowel prep for your procedure, you may not have a normal bowel movement for a few days.  Please Note:  You might notice some irritation and congestion in your nose or some drainage.  This is from the oxygen used during your procedure.  There is no need for concern and it should clear up in a day or so.  SYMPTOMS TO REPORT IMMEDIATELY:   Following lower endoscopy (colonoscopy or flexible sigmoidoscopy):  Excessive amounts of blood in the stool  Significant tenderness or worsening of abdominal pains  Swelling of the abdomen that is new, acute  Fever of 100F or higher  For urgent or emergent issues, a gastroenterologist can be reached at any hour by calling [phone]. Do not use MyChart messaging for urgent concerns.   DIET:  We do recommend a small meal at first, but then you may proceed to your regular diet.  Drink plenty of fluids but you should avoid alcoholic beverages for 24 hours.  ACTIVITY:  You should plan to take  it easy for the rest of today and you should NOT DRIVE or use heavy machinery until tomorrow (because of the sedation medicines used during the test).    FOLLOW UP: Our staff will call the number listed on your records 48-72 hours following your procedure to check on you and address any questions or concerns that you may have regarding the information given to you following your procedure. If we do not reach you, we will leave a message.  We will attempt to reach you two times.  During this call, we will ask if you have developed any symptoms of COVID 19. If you develop any symptoms (ie: fever, flu-like symptoms, shortness of breath, cough etc.) before then, please call [phone].  If you test positive for Covid 19 in the 2 weeks post procedure, please call and report this information to [place].    If any biopsies were taken you will be contacted by phone or by letter within the next 1-3 weeks.  Please call us at [phone] if you have not heard about the biopsies in 3 weeks.   SIGNATURES/CONFIDENTIALITY: You and/or your care partner have signed paperwork which will be entered into your electronic medical record.  These signatures attest to the fact that that the information above on your After Visit Summary has been reviewed and is understood.  Full responsibility of the confidentiality of this discharge information lies with you and/or your care-partner.

## 2021-01-26 NOTE — Progress Notes (Signed)
Pt's states no medical or surgical changes since previsit or office visit.Pt's states no medical or surgical changes since previsit or office visit.Pt's states no medical or surgical changes since previsit or office visit  CW - vitls.

## 2021-01-26 NOTE — Op Note (Signed)
[place] Patient Name: Seth Kerr Procedure Date: 01/26/2021 11:08 AM MRN: [id number] Endoscopist: Thornton Park MD, MD Age: 50 Referring MD:  Date of Birth: [birth year] Gender: Male Account #: [id number] Procedure:                Colonoscopy Indications:              Screening for colorectal malignant neoplasm, This                            is the patient's first colonoscopy Medicines:                Monitored Anesthesia Care Procedure:                Pre-Anesthesia Assessment:                           - Prior to the procedure, a History and Physical                            was performed, and patient medications and                            allergies were reviewed. The patient's tolerance of                            previous anesthesia was also reviewed. The risks                            and benefits of the procedure and the sedation                            options and risks were discussed with the patient.                            All questions were answered, and informed consent                            was obtained. Prior Anticoagulants: The patient has                            taken no previous anticoagulant or antiplatelet                            agents. ASA Grade Assessment: II - A patient with                            mild systemic disease. After reviewing the risks                            and benefits, the patient was deemed in                            satisfactory condition to undergo the procedure.  After obtaining informed consent, the colonoscope                            was passed under direct vision. Throughout the                            procedure, the patient's blood pressure, pulse, and                            oxygen saturations were monitored continuously. The                            Olympus CF-HQ190 (#[id number]) Colonoscope was                            introduced through the anus and  advanced to the 3                            cm into the ileum. The colonoscopy was performed                            without difficulty. The patient tolerated the                            procedure well. The quality of the bowel                            preparation was excellent. The terminal ileum,                            ileocecal valve, appendiceal orifice, and rectum                            were photographed. Scope In: 11:15:32 AM Scope Out: 11:26:01 AM Scope Withdrawal Time: 0 hours 8 minutes 57 seconds  Total Procedure Duration: 0 hours 10 minutes 29 seconds  Findings:                 The perianal and digital rectal examinations were                            normal.                           A single small and large-mouthed diverticulum was                            found in the entire colon. Complications:            No immediate complications. Estimated Blood Loss:     Estimated blood loss: none. Estimated blood loss:                            none. Impression:               - The entire examined colon is normal on direct and  retroflexion views.                           - No specimens collected. Recommendation:           - Patient has a contact number available for                            emergencies. The signs and symptoms of potential                            delayed complications were discussed with the                            patient. Return to normal activities tomorrow.                            Written discharge instructions were provided to the                            patient.                           - Resume previous diet. High fiber diet recommended.                           - Continue present medications.                           - Repeat colonoscopy in 10 years for surveillance,                            earlier with new symptoms.                           - Emerging evidence supports eating a diet of                             fruits, vegetables, grains, calcium, and yogurt                            while reducing red meat and alcohol may reduce the                            risk of colon cancer.                           - Thank you for allowing me to be involved in your                            colon cancer prevention. Thornton Park MD, MD 01/26/2021 11:31:36 AM This report has been signed electronically.

## 2021-01-28 ENCOUNTER — Telehealth: Payer: Self-pay

## 2021-01-28 NOTE — Telephone Encounter (Signed)
  Follow up Call-  Call back number 01/26/2021  Post procedure Call Back phone  # [phone]  Permission to leave phone message Yes  Some recent data might be hidden     Patient questions:  Do you have a fever, pain , or abdominal swelling? No. Pain Score  0 *  Have you tolerated food without any problems? Yes.    Have you been able to return to your normal activities? Yes.    Do you have any questions about your discharge instructions: Diet   No. Medications  No. Follow up visit  No.  Do you have questions or concerns about your Care? No.  Actions: * If pain score is 4 or above: No action needed, pain <4.  1. Have you developed a fever since your procedure? no  2.   Have you had an respiratory symptoms (SOB or cough) since your procedure? no  3.   Have you tested positive for COVID 19 since your procedure no  4.   Have you had any family members/close contacts diagnosed with the COVID 19 since your procedure?  no   If yes to any of these questions please route to Joylene John, RN and Joella Prince, RN

## 2021-03-03 ENCOUNTER — Telehealth: Payer: Self-pay

## 2021-03-03 NOTE — Telephone Encounter (Signed)
ATC patient in regards to Montelukast prescription to let him know that we have 3 bottles here at the office. LMTCB  I have called [facility] to stop mailing orders and to remove it from his med list and account.   When patient calls back please just ask him if he wants to come and pick them up or [place] dispose of them.

## 2021-03-07 NOTE — Telephone Encounter (Signed)
Called and spoke with patient in regards to Montelukast prescriptions. Patient advised to go ahead and dispose of them as he is no longer taking this medication. I expressed understanding. Nothing further needed at this time.

## 2021-03-28 ENCOUNTER — Telehealth: Payer: Self-pay

## 2021-03-28 NOTE — Telephone Encounter (Signed)
Submitted a Prior Authorization request to [facility] for [place] via CoverMyMeds. Will update once we receive a response.   Key: [id number]

## 2021-03-30 NOTE — Telephone Encounter (Signed)
Received notification from [facility] regarding a prior authorization for [place]. Authorization has been APPROVED from 03/28/2021 to 03/28/2022.   Authorization # [id number] Phone # [phone]

## 2021-04-22 ENCOUNTER — Encounter: Payer: Self-pay | Admitting: Family Medicine

## 2021-04-22 ENCOUNTER — Telehealth (INDEPENDENT_AMBULATORY_CARE_PROVIDER_SITE_OTHER): Payer: 59 | Admitting: Family Medicine

## 2021-04-22 ENCOUNTER — Other Ambulatory Visit: Payer: Self-pay

## 2021-04-22 VITALS — Wt 195.0 lb

## 2021-04-22 DIAGNOSIS — Z8616 Personal history of COVID-19: Secondary | ICD-10-CM

## 2021-04-22 DIAGNOSIS — J4550 Severe persistent asthma, uncomplicated: Secondary | ICD-10-CM | POA: Diagnosis not present

## 2021-04-22 DIAGNOSIS — R058 Other specified cough: Secondary | ICD-10-CM | POA: Diagnosis not present

## 2021-04-22 DIAGNOSIS — J0140 Acute pansinusitis, unspecified: Secondary | ICD-10-CM

## 2021-04-22 MED ORDER — BENZONATATE 200 MG PO CAPS: 200.0000 mg | ORAL_CAPSULE | Freq: Two times a day (BID) | ORAL | 0 refills | Status: DC | PRN

## 2021-04-22 MED ORDER — AZITHROMYCIN 250 MG PO TABS: ORAL_TABLET | ORAL | 0 refills | Status: AC

## 2021-04-22 NOTE — Progress Notes (Signed)
   Subjective:  Documentation for virtual audio and video telecommunications through [place] encounter:  The patient was located in his parked car. 2 patient identifiers used.  The provider was located in the office. The patient did consent to this visit and is aware of possible charges through their insurance for this visit.  The other persons participating in this telemedicine service were none. Time spent on call was 16 minutes and in review of previous records >20 minutes total.  This virtual service is not related to other E/M service within previous 7 days.   Patient ID: Seth Kerr, male    DOB: 09/11/1971, 50 y.o.   MRN: [id number]  HPI Chief Complaint  Patient presents with  . sick    Sick- had covid back in April. Cough, no energy, headache, stuffy nose, coughing up green phelgm. Had to do breathing treatment Sunday evening as it got bad. Felt like he has a fever the other day   Complains of a several week history of cough that has recently become productive.  2 day history of fatigue, intermittent fever, cough with green phlegm.  Frontal headache, purulent nasal drainage with some blood mixed in, sore throat, sinus pain and pressure, post nasal drainage.   States he had Covid in April and he has had a continuous cough since.   He has moderate/severe persistent asthma. Sees pulmonology. Did a breathing treatment and felt better.   Taking Mucinex, antihistamine and ibuprofen. Drinking    No dizziness, chest pain, palpitations, shortness of breath, abdominal pain, nausea, vomiting or diarrhea.  States he just returned via airplane travel.    Review of Systems Pertinent positives and negatives in the history of present illness.     Objective:   Physical Exam Wt 195 lb (88.5 kg)   BMI 27.20 kg/m   Oriented and in no acute distress.  Respirations unlabored.  Speaking in complete sentences without difficulty.      Assessment & Plan:  Acute pansinusitis,  recurrence not specified - Plan: azithromycin (ZITHROMAX) 250 MG tablet  Productive cough - Plan: azithromycin (ZITHROMAX) 250 MG tablet  Severe persistent asthma without complication  History of COVID-19  Z-Pak and Tessalon prescribed.  Discussed symptomatic treatment.  No current asthma flare.  Encouraged good hydration, Tylenol or ibuprofen, salt water gargles, Mucinex. Follow up if worsening or not improving significantly mid next week.

## 2021-05-12 ENCOUNTER — Ambulatory Visit (INDEPENDENT_AMBULATORY_CARE_PROVIDER_SITE_OTHER): Payer: 59

## 2021-05-12 ENCOUNTER — Other Ambulatory Visit: Payer: Self-pay

## 2021-05-12 DIAGNOSIS — Z23 Encounter for immunization: Secondary | ICD-10-CM

## 2021-05-18 ENCOUNTER — Other Ambulatory Visit: Payer: Self-pay | Admitting: Internal Medicine

## 2021-06-14 ENCOUNTER — Other Ambulatory Visit: Payer: Self-pay | Admitting: Internal Medicine

## 2021-06-17 NOTE — Telephone Encounter (Signed)
Would you like to refill this? Dupixent 300 MG

## 2021-06-20 ENCOUNTER — Telehealth (INDEPENDENT_AMBULATORY_CARE_PROVIDER_SITE_OTHER): Payer: 59 | Admitting: Family Medicine

## 2021-06-20 ENCOUNTER — Encounter: Payer: Self-pay | Admitting: Family Medicine

## 2021-06-20 ENCOUNTER — Telehealth: Payer: Self-pay | Admitting: Internal Medicine

## 2021-06-20 VITALS — Ht 71.0 in | Wt 195.0 lb

## 2021-06-20 DIAGNOSIS — J4550 Severe persistent asthma, uncomplicated: Secondary | ICD-10-CM

## 2021-06-20 DIAGNOSIS — R059 Cough, unspecified: Secondary | ICD-10-CM | POA: Diagnosis not present

## 2021-06-20 MED ORDER — PREDNISONE 10 MG (21) PO TBPK: ORAL_TABLET | ORAL | 0 refills | Status: DC

## 2021-06-20 MED ORDER — BENZONATATE 200 MG PO CAPS: 200.0000 mg | ORAL_CAPSULE | Freq: Three times a day (TID) | ORAL | 0 refills | Status: DC | PRN

## 2021-06-20 MED ORDER — ALBUTEROL SULFATE HFA 108 (90 BASE) MCG/ACT IN AERS
2.0000 | INHALATION_SPRAY | Freq: Four times a day (QID) | RESPIRATORY_TRACT | 1 refills | Status: DC | PRN
Start: 2021-06-20 — End: 2021-08-08

## 2021-06-20 NOTE — Progress Notes (Signed)
Start time: 10:54 End time: 11:06  Virtual Visit via Video Note  I connected with Seth Kerr on 06/20/21 by a video enabled telemedicine application and verified that I am speaking with the correct person using two identifiers.  Location: Patient: work Provider: office   I discussed the limitations of evaluation and management by telemedicine and the availability of in person appointments. The patient expressed understanding and agreed to proceed.  History of Present Illness:  Chief Complaint  Patient presents with   Cough    VIRTUAL has been experiencing a cough since June. He said you gave him a steroid once and it helped and tessalon. Leaving for cruise out of [place] tomorrow. Also requested refill on ProAir. He said that steroids always help. Had covid for second time in April, feels like cough never resolved, wakes him at night.   Patient presents with persistent cough.  Never fully resolved since COVID infection in April.  He got a cold in June, coughing more since. Cough is worse at night. Albuterol helps with the cough. He hasn't had shortness of breath, and is still able to run, exercise.  Steroid always knocks it out when he develops chronic cough like this.  He is going OOT for 2 weeks tomorrow, would like to try steroids to resolve the cough. Also needs refill on albuterol inhaler.  He has a few tessalon left, would like a refill to take on his trip--he doesn't want to be coughing while on vacation for 2 weeks.  He describes the cough as a hard, consistent cough, which occurs sporadically. Cough is nonproductive. No throat-clearing or PND. No sinus pain.  No fever or chills  Has been using mucinex daytime/nighttime, which seems to help  PMH, PSH. SH reviewed Asthma is very well controlled  Outpatient Encounter Medications as of 06/20/2021  Medication Sig Note   albuterol (PROVENTIL) (2.5 MG/3ML) 0.083% nebulizer solution Take 3 mLs (2.5 mg total) by nebulization  every 6 (six) hours as needed for wheezing or shortness of breath. 06/20/2021: Used last week   albuterol (VENTOLIN HFA) 108 (90 Base) MCG/ACT inhaler Inhale 2 puffs into the lungs every 6 (six) hours as needed for wheezing or shortness of breath.    Cholecalciferol (VITAMIN D) 50 MCG (2000 UT) CAPS Take 2,000 Units by mouth daily.    Dupilumab (DUPIXENT) 300 MG/2ML SOPN Inject 300 mg into the skin every 14 (fourteen) days.    fluticasone furoate-vilanterol (BREO ELLIPTA) 200-25 MCG/INH AEPB INHALE 1 INHALATION DAILY.    PE-Triprolidine-DM-GG-APAP (MUCINEX FAST-MAX DAY/NIGHT MS) LQPK Take 15 mLs by mouth as needed. 06/20/2021: Daytime-last dose yesterday am   predniSONE (STERAPRED UNI-PAK 21 TAB) 10 MG (21) TBPK tablet Take as directed    benzonatate (TESSALON) 200 MG capsule Take 1 capsule (200 mg total) by mouth 3 (three) times daily as needed for cough.    EPIPEN 2-PAK 0.3 MG/0.3ML SOAJ injection Use as directed (Patient not taking: Reported on 06/20/2021)    sildenafil (VIAGRA) 100 MG tablet Take 0.5-1 tablets (50-100 mg total) by mouth daily as needed for erectile dysfunction. (Patient not taking: Reported on 06/20/2021)    [DISCONTINUED] benzonatate (TESSALON) 200 MG capsule Take 1 capsule (200 mg total) by mouth 2 (two) times daily as needed. (Patient not taking: Reported on 06/20/2021)    No facility-administered encounter medications on file as of 06/20/2021.   Not taking tessalon or prednisone prior to today's visit  Allergies  Allergen Reactions   Aspirin Other (See Comments)  Not actual allergy, had chronic nosebleeds as a child and was instructed not to use aspirin.   ROS:  no fever, chills, URI symptoms, just persistent nonproductive cough. No chest pain, shortness of breath, n/v/d, rash or other concerns. See HPI.    Observations/Objective:  Ht '5\' 11"'$  (1.803 m)   Wt 195 lb (88.5 kg)   BMI 27.20 kg/m   Well-appearing, pleasant male. He is alert, oriented, in no distress, speaking  easily. No coughing during visit. No throat clearing Doesn't sound congested. Exam is limited due to virtual nature of the visit   Assessment and Plan:  Cough - persistent/chronic cough since URI. risks/SE steroids reviewed. Also refilled inhaler and tessalon, to use prn. Seek care if fever, SOB - Plan: predniSONE (STERAPRED UNI-PAK 21 TAB) 10 MG (21) TBPK tablet, benzonatate (TESSALON) 200 MG capsule  Severe persistent asthma without complication - well controlled on his current regimen. May have some component of post-viral RAD, given that inhaler helps with cough. Steroid taper, RF albuterol - Plan: predniSONE (STERAPRED UNI-PAK 21 TAB) 10 MG (21) TBPK tablet, albuterol (VENTOLIN HFA) 108 (90 Base) MCG/ACT inhaler   Follow Up Instructions:    I discussed the assessment and treatment plan with the patient. The patient was provided an opportunity to ask questions and all were answered. The patient agreed with the plan and demonstrated an understanding of the instructions.   The patient was advised to call back or seek an in-person evaluation if the symptoms worsen or if the condition fails to improve as anticipated.  I spent 16 minutes dedicated to the care of this patient, including pre-visit review of records, face to face time, post-visit ordering of testing and documentation.    Vikki Ports, MD

## 2021-06-20 NOTE — Patient Instructions (Signed)
Stay well hydated. Try taking Mucinex DM 12 hour tablets in place of the liquid (will wear off less frequently, and help prevent cough better).  Take the steroid pack as directed. You may use the tessalon if needed for cough not adequately treated with the other medications.  Seek care if you develop fever, or shortness of breath, pain with breathing.  Have a great trip!

## 2021-06-21 MED ORDER — DUPIXENT 300 MG/2ML ~~LOC~~ SOAJ
300.0000 mg | SUBCUTANEOUS | 12 refills | Status: DC
Start: 2021-06-21 — End: 2022-07-03

## 2021-06-21 NOTE — Telephone Encounter (Signed)
Rx has been electronically sent to [facility] for pt. Nothing further needed.

## 2021-06-21 NOTE — Telephone Encounter (Signed)
Pharmacist is out of office until Thursday, routing to clinical team.

## 2021-08-08 ENCOUNTER — Telehealth (INDEPENDENT_AMBULATORY_CARE_PROVIDER_SITE_OTHER): Payer: 59 | Admitting: Internal Medicine

## 2021-08-08 DIAGNOSIS — J4550 Severe persistent asthma, uncomplicated: Secondary | ICD-10-CM

## 2021-08-08 MED ORDER — ALBUTEROL SULFATE (2.5 MG/3ML) 0.083% IN NEBU: 2.5000 mg | INHALATION_SOLUTION | Freq: Four times a day (QID) | RESPIRATORY_TRACT | 11 refills | Status: DC | PRN

## 2021-08-08 MED ORDER — ALBUTEROL SULFATE HFA 108 (90 BASE) MCG/ACT IN AERS: 2.0000 | INHALATION_SPRAY | Freq: Four times a day (QID) | RESPIRATORY_TRACT | 1 refills | Status: DC | PRN

## 2021-08-08 MED ORDER — FLUTICASONE FUROATE-VILANTEROL 200-25 MCG/INH IN AEPB: 1.0000 | INHALATION_SPRAY | Freq: Every day | RESPIRATORY_TRACT | 5 refills | Status: DC

## 2021-08-08 NOTE — Telephone Encounter (Signed)
Called and spoke with pt who states he started coughing again about 2 weeks ago. States he went on a cruise beginning of August 2022. Pt said prior to the cruise, he did begin coughing and called PCP and was prescribed prednisone by PCP which helped.  Pt said that when he does have a flare up with the cough, he coughs so much that it messes with his breathing. Pt is having to do 1-2 breathing treatments a day. States that his cough has woken him up in the middle of the night to where he has had to do a breathing treatment.  Pt said that he is doing his Dupixent injections, taking his breo as prescribed. Pt states that he is also taking an allergy pill every day.  Pt does have an upcoming appt 9/21 and I did state to pt to bring covid vaccine card with him to appt in case a feno might need to be done and he verbalized understanding.  Pt is wanting to know if prednisone can possibly be prescribed to help with his symptoms. MR, please advise.

## 2021-08-08 NOTE — Telephone Encounter (Signed)
I have called and spoke with patient regarding MR recs. Patient verbalized and will wait for appt for 08/10/21.  MR wants to do feno, CXR and CBC with diff on that appt. I have put in these orders. Nothing further needed.

## 2021-08-08 NOTE — Telephone Encounter (Signed)
If he can wait till 08/10/21 for feno before prednisone will be ideal. IF not, do Take prednisone 40 mg daily x 2 days, then 20mg  daily x 2 days, then 10mg  daily x 2 days, then 5mg  daily x 2 days and stop   Still will do cxr 2 view and feno, cbc with diff on 08/10/21

## 2021-08-09 ENCOUNTER — Other Ambulatory Visit: Payer: Self-pay | Admitting: Internal Medicine

## 2021-08-10 ENCOUNTER — Ambulatory Visit (INDEPENDENT_AMBULATORY_CARE_PROVIDER_SITE_OTHER): Payer: 59 | Admitting: Internal Medicine

## 2021-08-10 ENCOUNTER — Ambulatory Visit (INDEPENDENT_AMBULATORY_CARE_PROVIDER_SITE_OTHER): Payer: 59

## 2021-08-10 ENCOUNTER — Encounter: Payer: Self-pay | Admitting: Internal Medicine

## 2021-08-10 ENCOUNTER — Other Ambulatory Visit: Payer: Self-pay

## 2021-08-10 VITALS — BP 126/70 | HR 63 | Temp 98.2°F | Ht 71.0 in | Wt 204.0 lb

## 2021-08-10 DIAGNOSIS — D7219 Other eosinophilia: Secondary | ICD-10-CM | POA: Diagnosis not present

## 2021-08-10 DIAGNOSIS — J4541 Moderate persistent asthma with (acute) exacerbation: Secondary | ICD-10-CM | POA: Diagnosis not present

## 2021-08-10 DIAGNOSIS — J4550 Severe persistent asthma, uncomplicated: Secondary | ICD-10-CM

## 2021-08-10 LAB — CBC WITH DIFFERENTIAL/PLATELET
Basophils Absolute: 0.1 10*3/uL (ref 0.0–0.1)
Basophils Relative: 1.1 % (ref 0.0–3.0)
Eosinophils Absolute: 1.4 10*3/uL — ABNORMAL HIGH (ref 0.0–0.7)
Eosinophils Relative: 17.3 % — ABNORMAL HIGH (ref 0.0–5.0)
HCT: 44.4 % (ref 39.0–52.0)
Hemoglobin: 15 g/dL (ref 13.0–17.0)
Lymphocytes Relative: 24.4 % (ref 12.0–46.0)
Lymphs Abs: 2 10*3/uL (ref 0.7–4.0)
MCHC: 33.9 g/dL (ref 30.0–36.0)
MCV: 91 fl (ref 78.0–100.0)
Monocytes Absolute: 0.9 10*3/uL (ref 0.1–1.0)
Monocytes Relative: 11.2 % (ref 3.0–12.0)
Neutro Abs: 3.8 10*3/uL (ref 1.4–7.7)
Neutrophils Relative %: 46 % (ref 43.0–77.0)
Platelets: 226 10*3/uL (ref 150.0–400.0)
RBC: 4.88 Mil/uL (ref 4.22–5.81)
RDW: 13 % (ref 11.5–15.5)
WBC: 8.3 10*3/uL (ref 4.0–10.5)

## 2021-08-10 LAB — NITRIC OXIDE: Nitric Oxide: 33

## 2021-08-10 MED ORDER — PREDNISONE 10 MG PO TABS: ORAL_TABLET | ORAL | 0 refills | Status: AC

## 2021-08-10 NOTE — Progress Notes (Signed)
Subjective:     Patient ID: Seth Kerr, male   DOB: 1971-08-27, 50 y.o.   MRN: [id number]  HPI    IOV 06/12/2016  Chief Complaint  Patient presents with   PULMONARY CONSULT    Referred by Dr. Mosetta Anis. Pt reports increasing breathing issues since moving to [place] 6 years ago. Pt had recent pna in April. Pt c/o continued SOB and chest tightness that is worse with exertion and some cough at night. Pt denies cough. Pt is on Breo and albuterol HFA. Pt is also on oral prednisone taper currently. Pt has significant second hand smoke exposure. Pt is on allergy injections weekly.     50 year old male works at [employer]. He is been referred by Dr. Donneta Romberg for evaluation of shortness of breath in the setting of confirmed diagnosis of asthma and allergies  History is gained from talking to the patient and also review of the referral notes. He tells me that he worked in the [employer] area. When he lived there he had no prior diagnosis of asthma respirator complaints. Moved to [place] approximately 6 years ago and then within several months started noticing insidious onset of shortness of breath and chest tightness. For the last 4-5 years he has been seeing Dr. Donneta Romberg with a confirmed diagnosis of asthma. He was then started on inhaled steroids and Singulair. With this he also had allergy testing which was positive. He says once his treatments commenced he was able to return to normal activities of daily living including running long distances. He felt well. Approximately one year ago he stopped his allergy shots. Then in April 2017 he says he had a pneumonia for which she was treated with antibiotics. There is a chest x-ray report from 03/08/2016 that reports a nodular focus in the left lower lobe which could her percent focus of pneumonia but could not exclude a pulmonary nodule. I personally visualized this film and agree. He had follow-up chest x-ray 05/05/2016 that  showed improvement. He says that he was beginning to feel better but then started having nocturnal symptoms again with shortness of breath. Shortness of breath is exertional. His been going on for the last few weeks. He is now unable to climb stairs without feeling shortness of breath. Currently is in the middle of a prednisone taper  In terms of his allergies: He had repeat allergy testing April 2017 and it is positive for different environmental allergens. He has now been restarted on allergy shots.  He has had spirometry with Dr. Donneta Romberg. In 07/21/2015 FEV1 1.44 L/35% with a ratio 68 showing severe obstruction. Follow-up 03/08/2016 on the time of pneumonia FEV1 2.2 L/53% with a ratio of 79 with a 13% bronchodilator response. Again showing obstruction of moderate degree. He had a repeat spirometry 03/17/2016 which is only prebronchodilator that showed further improvement FEV1 2.68 L/64% ratio 79. And then again 06/05/2016 he had no further improvement with a post bronchodilator response FEV1 of 2.5 L/61 ratio 80 which is a 15% bronchi were to response. Exhaled nitric oxide on the same day was elevated at 59 ppb. Due to obstructive nature of the spirometry is been referred here.  Walking desaturation test on an 85 feet 3 laps on room at: Did not desaturate below 97%. NOTED BRADYCARDIA - HR 60s entire walk- walked fast  Exhaled nitric oxide today in our office: 26 ppb and normal (was 59 on 06/05/16 @ Dr Donneta Romberg in office )  Noted: he is in middle of a 12d prednisone taper - 5 days left    has a past medical history of Allergic rhinitis, cause unspecified; Asthma; Erectile dysfunction; Irritable bowel syndrome (IBS); and Onychomycosis.   reports that he has never smoked. He has never used smokeless tobacco.   PFT 06/15/16  showed an FEV1 at 93%, ratio 83, FVC 88%, no significant bronchodilator response. Positive mid flow reversibility. DLCO 129%     07/27/2016 Acute OV  : Asthma  Patient returns  for an acute office visit. Complains for last 1.5 weeks that he has dry cough , wheezing, tightness , and dyspnea. Increased SABA use.  Patient was seen for pulmonary consult 06/15/2016 for asthma evaluation. Patient was having active asthma flare and was on prednisone taper. Marland Kitchen He was set up for a pulmonary function test on 06/15/2016 that showed significant improvement in lung function with an FEV1 at 93%, ratio 83% and no significant bronchodilator response. Previous spirometry had showed significant airflow obstructions with FEV1 ranging from 35-53%. It was felt that his lung function improved because of prednisone c/w asthma . He was being followed by Dr. Donneta Romberg , Allergist. He was previously on allergy vaccines but was stopped due to persistent asthma flares. Says he had no breathing problems until he moved here from [facility] 6 yrs ago. Previously ran marathons .  Exhaled nitric oxide test was 26. Previously had been 60. Today FENO is 87  - 07/27/16 Chart review shows very high eosinophils . CXR in April 2017 showed LLL nodular focus ? Pna .  Follow up cxr showed improvement in June.  Spiriva was added to his BREO last ov.    08/16/2016 Acute OV p  : Asthma  Patient returns for an acute office visit for persistent wheezing and cough .  Seen  2 weeks ago for asthma flare. Started on Steroid taper .  Felt some better but never stopped wheezing.  Labs showed IgE at, 254 + rast-tree/grass.  , High eosinophils on Diff , FeNO was elevated at 87  CXR showed mild opacity at left lung base.  CT chest done polylobar areas of scarring likely from previous pna, mild nodularity along lingula ? Scarring. No ILD changes.  He remains on BREO , Spiriva, Singulair , Xyzal and Nasonex .  Cough is keeping him up at night . Denies fever, chest pain, orthopnea , edema or hemoptysis, or discolored mucus.   Patient was seen for pulmonary consult 06/15/2016 for asthma evaluation. Patient was having active asthma flare  and was on prednisone taper. Marland Kitchen He was set up for a pulmonary function test on 06/15/2016 that showed significant improvement in lung function with an FEV1 at 93%, ratio 83% and no significant bronchodilator response. Previous spirometry had showed significant airflow obstructions with FEV1 ranging from 35-53%. It was felt that his lung function improved because of prednisone c/w asthma . He was being followed by Dr. Donneta Romberg , Allergist. He was previously on allergy vaccines but was stopped due to persistent asthma flares. Says he had no breathing problems until he moved here from [facility] 6 yrs ago. Previously ran marathons .     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 Since then he had a CT chest and allergy evaluation. What is significant is that he is significantly high used to this. CT chest is clean. He's not had CT sinus but he does not have sinus problems. His exhaled nitric oxide has been elevated during exacerbations. He saw nurse practitioner in the interim early September 2017 was given prednisone. When he came back again in late September 2017 which was one week ago with another exacerbation and again given prednisone. Visit lasted prednisone and is here for follow-up. He feels that he is largely better. He is worried that he needs prednisone repeatedly. He is not sure that he can manage when he comes off the prednisone but he is not a try. His daughter has similar problems after moving from [place] to [place] and she is on Xolair and has good control. Therefore he is questions on this. He says he is compliant with his inhalers.  Asthma control question test: This is a 4 week questionnaire but I asked him how he felt in  the last 2 days. He feels most of the time his asthma prevents him from getting his work done.Marland Kitchen He feels dyspneic more than once a day. He wakes up frequently at night. He is using albuterol rescue one or 2 times daily. He feels his asthma is poorly controlled despite d6 of prednisone and chest very tight   Exhaled nitric oxide today: While on prednisone 6th day is elevated at 80pbo    09/19/2016 Acute OV p  : Asthma  Patient returns for an acute office visit for persistent wheezing and cough .  Seen  3 weeks ago for asthma flare. Started on medrol dose pack and zpack . Felt some better for couple of weeks then symptoms returned with dry cough and wheezing  Increased SABA use for last 2-3 days.  FeNO today is 56 . (no steroids for 2 weeks) .    50 yo male Never smoker seen for pulmonary consult 06/12/2016 for shortness of breath and asthma evaluation.   PFT 06/15/16  showed an FEV1 at 93%, ratio 83, FVC 88%, no significant bronchodilator response. Positive mid flow reversibility. DLCO 129% 07/27/16  IgE at, 254 + rast-tree/grass.  , High eosinophils on Diff , FeNO was elevated at 87  92017 CT chest done polylobar areas of scarring likely from previous pna, mild nodularity along lingula ? Scarring. No ILD changes.     He remains on BREO , Spiriva, Singulair , Xyzal and Nasonex .  Nucala paperwork has been started. Awaiting approval . We checked on process, waiting on PA .  Denies fever, chest pain, orthopnea , edema or hemoptysis, or discolored mucus.   Patient was seen for pulmonary consult 06/15/2016 for asthma evaluation. Patient was having active asthma flare and was on prednisone taper. Marland Kitchen He was set up for a pulmonary function test on 06/15/2016 that showed significant improvement in lung function with an FEV1 at 93%, ratio 83% and no significant bronchodilator response. Previous spirometry had showed significant airflow obstructions with FEV1 ranging from 35-53%. It was felt that his  lung function improved because of prednisone c/w asthma . He was being followed by Dr. Donneta Romberg , Allergist. He was previously on allergy vaccines but was stopped due to persistent asthma flares. Says he had no breathing problems until he moved here from [facility] 6 yrs ago. Previously ran marathons .   OV 10/24/2016  Chief Complaint  Patient presents with   Follow-up  Pt saw TP on 10.31.17 for an acute visit. Pt c/o nonprod cough with intermittent chest congestion, increase in SOB. Pt deneis CP/tightness and f/c/s. Pt states he feels he worsens when he comes off the prednisone.      Follow-up for eosinophilic asthma  He still waiting for his interleukin-5 receptor antibody treatment to start. It is currently been approved for the shipment is pending. He tells me that he is unable to come off prednisone completely. Currently he is off prednisone for 2 weeks and he is insidious exacerbation with increased nocturnal symptoms, daily albuterol use multiple times and wheezing and chest tightness. He feels he needs another prednisone. He does not want to do daily prednisone. Preference is to start Nucala and see if he can avoid prednisone. He is frustrated    has a past medical history of Allergic rhinitis, cause unspecified; Asthma; Erectile dysfunction; Irritable bowel syndrome (IBS); and Onychomycosis.   reports that he has never smoked. He has never used smokeless tobacco.  OV 12/25/2016  Chief Complaint  Patient presents with   Follow-up    Pt states since being on the Nucala his cough has improved but states his SOB has worsened and is using his nebs more. Pt states he has upper left chest pain infrequently.    Fu eos ashma  - s/p nucala start 11/08/2016/ Positive IgE at 284 and significant allergy test positive but currently not on allergy shots  He's completed 2 doses of monthly interleukin-5 receptor antibody treatment for asthma (nucal he says this is improved his cough). He also tells me  that he has not been on prednisone since strting nucala but according to chart review he did take one episode of prednisone January 2018. He does not recollect this. He tells me that cough is significantly improved since starting  nucala but he still gets episodes of chest tightness particularly wakes him up from sleeping. He is using albuterol rescue 3 times a day which she thinks is a little more than his baseline is no fever or sputum production is not any allergy shots. He tells me is compliant with the Brio and Singulair and Spiriva     exhaled nitric oxide today is elevated at 80 ppb   he does have a lung nodule from sept 2017  OV 03/05/2017  Chief Complaint  Patient presents with   Follow-up    2 month follow up after CT scan and blood work. States he is not coughing at much as he used to.      Follow-up very poorly controlled asthma despite compliance associated with high IgE and I eosinophilia.  He is on interleukin-5 receptor antibody injectable therapy. He says this is only helped his cough but overall there is no improvement and shortness of breath the frequency of asthma exacerbations her sense of well-being are similar to scale down his Brio, Singulair and Spiriva treatment. He just does not feel optimized. He feels he is wheezing all the time in fact today also is wheezing. He wants a 5 day prednisone. Most recently had CT chest that did not show lung nodule and ruled out ABPA. His blood eosinophilic count is improved with injectable therapy      OV 06/26/2017  Chief Complaint  Patient presents with   Follow-up    Pt states he is not on Xolair yet. Pt states he feels he is doing well. Pt states he only uses his albuterol hfa once a month. Pt denies SOB, cough, CP/tightness, f/c/s.  Follow-up allergy poorly controlled asthma with high eosinophils and high IgE  He is on Spiriva, inhaled corticosteroids and long-acting beta agonist and Singulair. He is also on interleukin-5  receptor antibody nucala. Despite this treatment he has had poorly controlled asthma despite the reported compliance. We then decided to introduce Xolair as an add-on therapy. We did have discussions with him about the unknown safety and efficacy data about doing combination. I did talk to 1 national experts or regional experts. I then we'll change the plan to rotate him to Xolair and stop the interleukin-5 receptor antibody. All this has taken some months. In the interim his asthma is continued to do better. He likes the interleukin-5 receptor antibody and therefore he does not want to stop it currently feels his asthma is well controlled. His last prednisone was in April 2018. He currently is working out in [employer] and able to swim and run. His daughter is on Xolair therapy which works well for her.  Of note his exhaled nitric oxide continues to be high and it is unclear why   OV 12/25/2017  Chief Complaint  Patient presents with   Follow-up    Pt states he has been doing good. Nucala injections have been helping. Pt states he has had some flareups and had to use his rescue inhaler twice yesterday, 12/24/17 and did two neb treatments. Pt has complaints of chest tightness and SOB but not much of a cough.    50 year old male with allergic asthma with eosinophilia and elevated IgE.  On Spiriva, Brio, Singulair and interleukin-5 receptor antibody injections monthly Nucala  Overall he says his health status is improved because of the biologic injections against asthma but he still having frequent breakthrough exacerbations requiring prednisone.  Few months ago we tried to switch him to Xolair but then he wanted to hold off.  At this point in time he is willing to try another biological.  Today he says that he is got increased asthma exacerbation with increased wheezing and shortness of breath and chest tightness.  Asthma control questionnaire is 3 showing that he is waking up a few times at night and when he  wakes up he has moderate symptoms.  His activities are slightly limited because of asthma and is short of breath quite a lot and is wheezing a lot of the time and is using albuterol for rescue 1-2 puffs.  He works in [employer] and does 2 weeks of night shifts and 2 weeks of day shifts.  The night shift or harderAnd is wondering if this is because of diurnal variation of asthma   OV 04/26/2018  Chief Complaint  Patient presents with   Follow-up    62-month follow up. Pt states he has been doing well since last visit and denies any complaints. Pt is on dupixent and is doing great.   Davis Gourd , 50 y.o. , with dob [birth year] and male ,Not Hispanic or Latino from [address] [place] [address] - presents to lung clinic for difficult to control asthm with blood eos and high IgE.  Last visit was in February 2019.  At that time we switched his Nucala to dupilumab.  He has been taking diplomat now for the last 2 to 3 months.  He says that there is the best ever he has been in his entire life from asthma.  His asthma control question is now 0.  He was able to bike 50 miles in the heat.  He was able to run also for miles.  He has not used albuterol in many months.  His last prednisone was many months ago.  He is not waking up in the middle of the night with symptoms his chest does not feel tight when he wakes up.  He is not limited to do all in his activities because of asthma he is not having any shortness of breath or wheezing or cough because of asthma.  At this point in time he is taking Breo, Spiriva, Singulair but has run out of his antihistamine.  He is asking for further reduction in his therapy.  His exam nitric oxide is 8 and it has never been normal in  the entire time that I have seen him.   OV 08/18/2020   Subjective:  Patient ID: Davis Gourd, male , DOB: [birth year], age 65 y.o. years. , MRN: [id number],  ADDRESS: [address] [place] [id number] PCP  Rita Ohara, MD Providers  : Treatment Team:  Attending Provider: Brand Males, MD   Chief Complaint  Patient presents with   Follow-up    Feeling good overall no concerns at this time       HPI Rawlins Stuard 50 y.o. -personally not seen in over 2 years.  Asthma follow-up.  Currently on Dupixent doing really well.  Also taking Breo and Singulair.  Asthma is well controlled.  He is training for an [place].  He feels that good.  Albuterol use is nonexistent ACT score is really good showing good control.    He has had his Covid vaccine.  He did have COVID-19 in November 2020.  He wants to have antibody test.  We discussed limitations and interpretation of antibody test.    He will have a flu shot today. -He is in need of 1.       Asthma Control Test ACT Total Score  08/18/2020 25    Lab Results  Component Value Date   NITRICOXIDE 90 12/25/2017      ROS - per HPI     OV 08/10/2021  Subjective:  Patient ID: Davis Gourd, male , DOB: 27-Sep-1971 , age 73 y.o. , MRN: [id number] , ADDRESS: [place] [address] PCP Rita Ohara, MD Patient Care Team: Rita Ohara, MD as PCP - General (Family Medicine)  This Provider for this visit: Treatment Team:  Attending Provider: Brand Males, MD    08/10/2021 -   Chief Complaint  Patient presents with   Follow-up    Pt states his cough is doing better but states that he does cough off and on. States he has been having to do some breathing treatments to help.   Moderate/severe asthma with eosinophilia and elevated IgE.  Failed Nucala and on Dupixent since 2019 with improvement  HPI Edmundo Tedesco 50 y.o. -is an acute visit.  Last seen and 2021 September.  Currently is not feeling well.  He tells me that sometime in October 2020 when he ran a triathlon and after that had cough.  He was given prednisone and it helped.  Then in spring 2022 had second [place].  Was mild and nothing happened.  Then he traveled and got a cold.  This is  approximately May 2022.  Primary care physician gave him a round of prednisone.  This was a second round of prednisone since I last saw him.  Then in July 2022 he began to have a cough.  He got worried because he was heading  on international cruise in August 2022.  He took a course of prednisone and the cough went away.  He then went on his cruise.  During the cruise he was feeling well but he did not take his [place].  He missed 2 doses.  He restarted his Dupixent approximately 2 weeks ago and then approximately 10 days ago started having cough.  Cough is rated as moderate-severe.  Sometimes it wakes him up at night.  Albuterol helps.  There is occasional wheezing.  There is no fever or chills or sputum.  Cough quality is dry.     Ref. Range 06/12/2016 16:09 07/27/2016 16:55 08/23/2016 16:14 09/19/2016 09:30 12/25/2016 09:00 06/26/2017  12/25/2017  04/26/2018  08/18/2020  08/10/2021   ACQ/ACT sine sept 2021        3 0 25 12  Nitric Oxide Unknown 26 87 80 56 84 84 90 8 x 33  treatment      On nucala since dec 2017 nucaka nucala [place] followup on dupixent x 2-3 months         PFT  PFT Results Latest Ref Rng & Units 06/15/2016  FVC-Pre L 4.26  FVC-Predicted Pre % 82  FVC-Post L 4.54  FVC-Predicted Post % 88  Pre FEV1/FVC % % 80  Post FEV1/FCV % % 83  FEV1-Pre L 3.42  FEV1-Predicted Pre % 84  FEV1-Post L 3.76  DLCO uncorrected ml/min/mmHg 41.88  DLCO UNC% % 129  DLVA Predicted % 153  TLC L 6.27  TLC % Predicted % 90  RV % Predicted % 96    Results for ZAYLEN, SUSMAN (MRN [id number]) as of 08/10/2021 14:41  Ref. Range 07/27/2016 17:03  Sheep Sorrel IgE Latest Units: kU/L <0.10  Pecan/Hickory Tree IgE Latest Units: kU/L <0.10  IgE (Immunoglobulin E), Serum Latest Ref Range: <115 kU/L 254 (H)  Allergen, D pternoyssinus,d7 Latest Units: kU/L <0.10  Cat Dander Latest Units: kU/L <0.10  Dog Dander Latest Units: kU/L 0.87 (H)  [place] Grass Latest Units: kU/L 0.59 (H)  Johnson Grass Latest Units:  kU/L 0.36 (H)  Timothy Grass Latest Units: kU/L 2.39 (H)  Cockroach Latest Units: kU/L <0.10  Aspergillus fumigatus, m3 Latest Units: kU/L 0.21 (H)  Allergen, Comm Silver Wendee Copp, t9 Latest Units: kU/L <0.10  Allergen, Cottonwood, t14 Latest Units: kU/L <0.10  Elm IgE Latest Units: kU/L 0.11 (H)  Allergen, Mulberry, t76 Latest Units: kU/L <0.10  Allergen, Oak,t7 Latest Units: kU/L <0.10  Common Ragweed Latest Units: kU/L <0.10  Allergen, Mouse Urine Protein, e78 Latest Units: kU/L <0.10  D. farinae Latest Units: kU/L <0.10  Allergen, Cedar tree, t12 Latest Units: kU/L <0.10  Box Elder IgE Latest Units: kU/L <0.10  Rough Pigweed  IgE Latest Units: kU/L 0.19 (H)     has a past medical history of Allergic rhinitis, cause unspecified, Asthma, COVID-19 virus infection (09/29/2019), Erectile dysfunction, Irritable bowel syndrome (IBS), and Onychomycosis.   reports that he has never smoked. He has never used smokeless tobacco.  Past Surgical History:  Procedure Laterality Date   COLONOSCOPY  last 2012   in [place] Normal exam per pt   SEPTOPLASTY  approx age 46   SHOULDER SURGERY Right    TONSILLECTOMY  age 25   VASECTOMY      Allergies  Allergen Reactions   Aspirin Other (See Comments)    Not actual allergy, had chronic nosebleeds as a child and was instructed not to use aspirin.    Immunization History  Administered Date(s) Administered   Influenza  Split 09/12/2012, 08/29/2014, 09/05/2016   Influenza,inj,Quad PF,6+ Mos 10/09/2013, 09/03/2015, 08/29/2017, 08/28/2018, 08/25/2019, 08/18/2020   PFIZER Comirnaty(Gray Top)Covid-19 Tri-Sucrose Vaccine 05/12/2021   PFIZER(Purple Top)SARS-COV-2 Vaccination 01/21/2020, 02/11/2020   Pneumococcal Polysaccharide-23 05/05/2015   Tdap 10/04/2010, 04/22/2014    Family History  Problem Relation Age of Onset   Cancer Mother        lung cancer, smoker   Alcohol abuse Mother    Cancer Father        stomach cancer, smoker   Alcohol abuse  Father    Stomach cancer Father    Asthma Daughter    Diabetes Neg Hx    Heart disease Neg Hx    Esophageal cancer Neg Hx    Rectal cancer Neg Hx    Colon cancer Neg Hx    Colon polyps Neg Hx      Current Outpatient Medications:    albuterol (PROVENTIL) (2.5 MG/3ML) 0.083% nebulizer solution, Take 3 mLs (2.5 mg total) by nebulization every 6 (six) hours as needed for wheezing or shortness of breath., Disp: 75 mL, Rfl: 11   albuterol (VENTOLIN HFA) 108 (90 Base) MCG/ACT inhaler, Inhale 2 puffs into the lungs every 6 (six) hours as needed for wheezing or shortness of breath., Disp: 18 g, Rfl: 1   benzonatate (TESSALON) 200 MG capsule, Take 1 capsule (200 mg total) by mouth 3 (three) times daily as needed for cough., Disp: 20 capsule, Rfl: 0   Cholecalciferol (VITAMIN D) 50 MCG (2000 UT) CAPS, Take 2,000 Units by mouth daily., Disp: , Rfl:    Dupilumab (DUPIXENT) 300 MG/2ML SOPN, Inject 300 mg into the skin every 14 (fourteen) days., Disp: 2 mL, Rfl: 12   EPIPEN 2-PAK 0.3 MG/0.3ML SOAJ injection, Use as directed, Disp: 1 each, Rfl: 0   fluticasone furoate-vilanterol (BREO ELLIPTA) 200-25 MCG/INH AEPB, Inhale 1 puff into the lungs daily., Disp: 60 each, Rfl: 5   loratadine (CLARITIN) 10 MG tablet, Take 10 mg by mouth daily., Disp: , Rfl:    montelukast (SINGULAIR) 10 MG tablet, Take 10 mg by mouth at bedtime., Disp: , Rfl:    PE-Triprolidine-DM-GG-APAP (MUCINEX FAST-MAX DAY/NIGHT MS) LQPK, Take 15 mLs by mouth as needed., Disp: , Rfl:    predniSONE (DELTASONE) 10 MG tablet, Take 4 tablets (40 mg total) by mouth daily with breakfast for 2 days, THEN 2 tablets (20 mg total) daily with breakfast for 2 days, THEN 1 tablet (10 mg total) daily with breakfast for 2 days, THEN 0.5 tablets (5 mg total) daily with breakfast for 2 days., Disp: 15 tablet, Rfl: 0   sildenafil (VIAGRA) 100 MG tablet, Take 0.5-1 tablets (50-100 mg total) by mouth daily as needed for erectile dysfunction., Disp: 5 tablet, Rfl:  11      Objective:   Vitals:   08/10/21 1412  BP: 126/70  Pulse: 63  Temp: 98.2 F (36.8 C)  TempSrc: Oral  SpO2: 97%  Weight: 204 lb (92.5 kg)  Height: 5\' 11"  (1.803 m)    Estimated body mass index is 28.45 kg/m as calculated from the following:   Height as of this encounter: 5\' 11"  (1.803 m).   Weight as of this encounter: 204 lb (92.5 kg).  @WEIGHTCHANGE @  [employer]   08/10/21 1412  Weight: 204 lb (92.5 kg)     Physical ExamGeneral: No distress. Looks well Neuro: Alert and Oriented x 3. GCS 15. Speech normal Psych: Pleasant Resp:  Barrel Chest - no.  Wheeze - no, Crackles - no, No  overt respiratory distress CVS: Normal heart sounds. Murmurs - no Ext: Stigmata of Connective Tissue Disease - no HEENT: Normal upper airway. PEERL +. No post nasal drip        Assessment:       ICD-10-CM   1. Moderate persistent asthma with acute exacerbation  J45.41 Pulmonary function test    IgE    2. Severe persistent asthma without complication  W97.98 Nitric oxide    3. Other eosinophilia  D72.19          Plan:     Patient Instructions  Moderate persistent asthma with acute exacerbation Other eosinophilia   -Seems like you are having an asthma flareup because he ran out of [place] while on the cruise in August 2022.  Missed 2 doses  -Still this would be a fourth flareup sent September 2021 despite being on [place], Breo and Singulair  Plan - Do chest x-ray two-view - Do CBC with differential and blood IgE -Continue Singulair, Dupixent and Breo daily as before - Take prednisone 40 mg daily x 2 days, then 20mg  daily x 2 days, then 10mg  daily x 2 days, then 5mg  daily x 2 days and stop -Refer Dr. [name] severe asthma specialist in our practice  -See her for a few visits to see if anything different can be done/could be done  -After that if you like the relationship you can establish with and transfer care.  Or you can also come back to [place] to see me.   Or you can maintain both providers.  He can take a shared decision making with Dr. Shearon Stalls about this -Do full pulmonary function test in 6 weeks  Follow-up - Dr. Shearon Stalls in 6 weeks but after full pulmonary function tests    SIGNATURE    Dr. Brand Males, M.D., F.C.C.P,  Pulmonary and Critical Care Medicine Staff Physician, [place] Director - Interstitial Lung Disease  Program  [facility] [place] [facility] [place], [place], [address]  Pager: [phone], If no answer or between  15:00h - 7:00h: call [phone] Telephone: [phone]  2:49 PM 08/10/2021

## 2021-08-10 NOTE — Patient Instructions (Addendum)
Moderate persistent asthma with acute exacerbation Other eosinophilia   -Seems like you are having an asthma flareup because he ran out of [place] while on the cruise in August 2022.  Missed 2 doses  -Still this would be a fourth flareup sent September 2021 despite being on [place], Breo and Singulair  Plan - Do chest x-ray two-view - Do CBC with differential and blood IgE -Continue Singulair, Dupixent and Breo daily as before - Take prednisone 40 mg daily x 2 days, then 20mg  daily x 2 days, then 10mg  daily x 2 days, then 5mg  daily x 2 days and stop -Refer Dr. [name] severe asthma specialist in our practice  -See her for a few visits to see if anything different can be done/could be done  -After that if you like the relationship you can establish with and transfer care.  Or you can also come back to [place] to see me.  Or you can maintain both providers.  He can take a shared decision making with Dr. Shearon Stalls about this -Do full pulmonary function test in 6 weeks  Follow-up - Dr. Shearon Stalls in 6 weeks but after full pulmonary function tests

## 2021-08-11 LAB — IGE: IgE (Immunoglobulin E), Serum: 7 kU/L (ref <=114)

## 2021-09-21 ENCOUNTER — Other Ambulatory Visit: Payer: Self-pay | Admitting: Internal Medicine

## 2021-09-21 ENCOUNTER — Other Ambulatory Visit: Payer: Self-pay

## 2021-09-21 ENCOUNTER — Ambulatory Visit (INDEPENDENT_AMBULATORY_CARE_PROVIDER_SITE_OTHER): Payer: 59 | Admitting: Internal Medicine

## 2021-09-21 ENCOUNTER — Encounter: Payer: Self-pay | Admitting: Internal Medicine

## 2021-09-21 VITALS — BP 112/80 | HR 62 | Temp 97.1°F | Ht 71.0 in | Wt 210.0 lb

## 2021-09-21 DIAGNOSIS — J4550 Severe persistent asthma, uncomplicated: Secondary | ICD-10-CM | POA: Diagnosis not present

## 2021-09-21 DIAGNOSIS — J4541 Moderate persistent asthma with (acute) exacerbation: Secondary | ICD-10-CM | POA: Diagnosis not present

## 2021-09-21 DIAGNOSIS — Z23 Encounter for immunization: Secondary | ICD-10-CM | POA: Diagnosis not present

## 2021-09-21 LAB — PULMONARY FUNCTION TEST
DL/VA % pred: 133 %
DL/VA: 5.87 ml/min/mmHg/L
DLCO cor % pred: 124 %
DLCO cor: 36.28 ml/min/mmHg
DLCO unc % pred: 124 %
DLCO unc: 36.28 ml/min/mmHg
FEF 25-75 Post: 3.93 L/sec
FEF 25-75 Pre: 3.08 L/sec
FEF2575-%Change-Post: 27 %
FEF2575-%Pred-Post: 114 %
FEF2575-%Pred-Pre: 89 %
FEV1-%Change-Post: 8 %
FEV1-%Pred-Post: 92 %
FEV1-%Pred-Pre: 85 %
FEV1-Post: 3.62 L
FEV1-Pre: 3.34 L
FEV1FVC-%Change-Post: 4 %
FEV1FVC-%Pred-Pre: 100 %
FEV6-%Change-Post: 3 %
FEV6-%Pred-Post: 91 %
FEV6-%Pred-Pre: 88 %
FEV6-Post: 4.43 L
FEV6-Pre: 4.27 L
FEV6FVC-%Pred-Post: 103 %
FEV6FVC-%Pred-Pre: 103 %
FVC-%Change-Post: 3 %
FVC-%Pred-Post: 88 %
FVC-%Pred-Pre: 84 %
FVC-Post: 4.43 L
FVC-Pre: 4.27 L
Post FEV1/FVC ratio: 82 %
Post FEV6/FVC ratio: 100 %
Pre FEV1/FVC ratio: 78 %
Pre FEV6/FVC Ratio: 100 %
RV % pred: 82 %
RV: 1.7 L
TLC % pred: 86 %
TLC: 6.02 L

## 2021-09-21 LAB — POCT EXHALED NITRIC OXIDE
FeNO level (ppb): 7
FeNO level (ppb): 7

## 2021-09-21 MED ORDER — DULERA 200-5 MCG/ACT IN AERO: 2.0000 | INHALATION_SPRAY | Freq: Two times a day (BID) | RESPIRATORY_TRACT | 5 refills | Status: DC

## 2021-09-21 NOTE — Progress Notes (Signed)
Full PFT performed today. °

## 2021-09-21 NOTE — Progress Notes (Signed)
Seth Kerr    [id number]    15-Mar-1971  Primary Care Physician:Knapp, Tera Helper, MD Date of Appointment: 09/21/2021 Established Patient Visit  Chief complaint:   Chief Complaint  Patient presents with   Follow-up    No complaints.  PFT results.     HPI: Seth Kerr is a 50 y.o. man with severe persistent asthma on dupixent  Interval Updates: Former patient of Dr. Chase Caller. Here to see me today for new patient  Moved to [place] about 11 years ago which is when he started noticing respiratory issues and coughing. Saw Dr. Mosetta Anis and started SCIT. Then saw Dr. Chase Caller who started treatment for asthma.   Notes seasonal allergies, and Has peripheral eosinophilia on previous blood work [facility] Fall 2021. Recently was on a cruise and missed about a month of dupixent. Back on a regular schedule now.  Needed prednisone 3 times in the last year, at least one of the times when he was off his dupixent. He was also non-adherent to ICS-LABA while on cruise.    Is on Breo 200 which he doesn't take daily usually 4-5 times/week.   Takes albuterol inhaler infrequently  Current Regimen: Breo 200, albuterol Asthma Triggers: environmental allergies Exacerbations in the last year: has needed 3 times in the past year.  History of hospitalization or intubation: Allergy Testing: has had SCIT in the past with Dr. Donneta Romberg.  GERD: denies Allergic Rhinitis: yes, seasonal allergies. Takes allegra, montelukast.  ACT:  Asthma Control Test ACT Total Score  09/21/2021 22  08/10/2021 12  08/18/2020 25   FeNO: 33 ppb in September 2022, 7 ppb today   He has daily chronic cough. Cough has improved with allegra. Now just bothers him sometimes at night.    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  Passive smoke exposure in childhood, 4 dogs at home.   I have reviewed the patient's family social and past medical history and updated as appropriate.    Past Medical History:  Diagnosis Date   Allergic rhinitis, cause unspecified    immunotherapy x 70yrs (stopped end of 2015), per Dr. Donneta Romberg   Asthma    COVID-19 virus infection 09/29/2019   Erectile dysfunction    Irritable bowel syndrome (IBS)    has had 2 colonoscopies   Onychomycosis    s/p lamisil x 2 courses    Past Surgical History:  Procedure Laterality Date   COLONOSCOPY  last 2012   in [place] Normal exam per pt   SEPTOPLASTY  approx age 17   SHOULDER SURGERY Right    TONSILLECTOMY  age 9   VASECTOMY      Family History  Problem Relation Age of Onset   Cancer Mother        lung cancer, smoker   Alcohol abuse Mother    Cancer Father        stomach cancer, smoker   Alcohol abuse Father    Stomach cancer Father    Asthma Daughter    Diabetes Neg Hx    Heart disease Neg Hx    Esophageal cancer Neg Hx    Rectal cancer Neg Hx    Colon cancer Neg Hx    Colon polyps Neg Hx     Social History   Occupational History   Occupation: Curator at [employer]    Employer: [employer]  Tobacco Use   Smoking status: Never   Smokeless  tobacco: Never  Vaping Use   Vaping Use: Never used  Substance and Sexual Activity   Alcohol use: Yes    Comment: 2 glasses of wine (or vodka soda) 5x/week   Drug use: No   Sexual activity: Yes    Partners: Female     Physical Exam: Blood pressure 112/80, pulse 62, temperature (!) 97.1 F (36.2 C), temperature source Oral, height 5\' 11"  (1.803 m), weight 210 lb (95.3 kg), SpO2 97 %.  Gen:      No acute distress ENT:  no nasal polyps, mucus membranes moist, mild cobblestoning in oropharynx Lungs:    No increased respiratory effort, symmetric chest wall excursion, clear to auscultation bilaterally, no wheezes or crackles CV:         Regular rate and rhythm; no murmurs, rubs, or gallops.  No pedal edema   Data Reviewed: Imaging: I have personally reviewed the chest xray 07/2021 no acute  process, mild peribronchial thickening.  PFTs:  PFT Results Latest Ref Rng & Units 09/21/2021 06/15/2016  FVC-Pre L 4.27 4.26  FVC-Predicted Pre % 84 82  FVC-Post L 4.43 4.54  FVC-Predicted Post % 88 88  Pre FEV1/FVC % % 78 80  Post FEV1/FCV % % 82 83  FEV1-Pre L 3.34 3.42  FEV1-Predicted Pre % 85 84  FEV1-Post L 3.62 3.76  DLCO uncorrected ml/min/mmHg 36.28 41.88  DLCO UNC% % 124 129  DLCO corrected ml/min/mmHg 36.28 -  DLCO COR %Predicted % 124 -  DLVA Predicted % 133 153  TLC L 6.02 6.27  TLC % Predicted % 86 90  RV % Predicted % 82 96   I have personally reviewed the patient's PFTs and today showed normal pulmonary function.  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 Suspect his 3 prednisone courses in the last year is due to non-adherence to both ICS-LABA and his dupixent intermittently.  Suspect Breo is not adequate ICS for him given his poor control. Will perform benefits inquiry for ICS-LABA. Continue breo until then.  Continue albuterol prn.  Continue daily montelukast and allegra.   Spent time educating on asthma disease and progression today including importance of daily ICS-LABA.  Challenges to care include taking multiple daily medications and remembering to do so.   Flu shot today.   Return to Care: Return in about 3 months (around  12/22/2021).   [name], MD Pulmonary and [place]

## 2021-09-21 NOTE — Patient Instructions (Signed)
Full PFT performed today. °

## 2021-09-21 NOTE — Patient Instructions (Signed)
Please schedule follow up scheduled with myself in 3 months.  If my schedule is not open yet, we will contact you with a reminder closer to that time.  Stop Breo. Start taking Dulera 2 puffs in the morning, 2 puffs at night. Gargle after use.  Take the albuterol rescue inhaler every 4 to 6 hours as needed for wheezing or shortness of breath. You can also take it 15 minutes before exercise or exertional activity. Side effects include heart racing or pounding, jitters or anxiety. If you have a history of an irregular heart rhythm, it can make this worse. Can also give some patients a hard time sleeping.  By learning about asthma and how it can be controlled, you take an important step toward managing this disease. Work closely with your asthma care team to learn all you can about your asthma, how to avoid triggers, what your medications do, and how to take them correctly. With proper care, you can live free of asthma symptoms and maintain a normal, healthy lifestyle.   What is asthma? Asthma is a chronic disease that affects the airways of the lungs. During normal breathing, the bands of muscle that surround the airways are relaxed and air moves freely. During an asthma episode or "attack," there are three main changes that stop air from moving easily through the airways: The bands of muscle that surround the airways tighten and make the airways narrow. This tightening is called bronchospasm.  The lining of the airways becomes swollen or inflamed.  The cells that line the airways produce more mucus, which is thicker than normal and clogs the airways.  These three factors - bronchospasm, inflammation, and mucus production - cause symptoms such as difficulty breathing, wheezing, and coughing.  What are the most common symptoms of asthma? Asthma symptoms are not the same for everyone. They can even change from episode to episode in the same person. Also, you may have only one symptom of asthma, such as  cough, but another person may have all the symptoms of asthma. It is important to know all the symptoms of asthma and to be aware that your asthma can present in any of these ways at any time. The most common symptoms include: Coughing, especially at night  Shortness of breath  Wheezing  Chest tightness, pain, or pressure   Who is affected by asthma? Asthma affects 22 million Americans; about 6 million of these are children under age 41. People who have a family history of asthma have an increased risk of developing the disease. Asthma is also more common in people who have allergies or who are exposed to tobacco smoke. However, anyone can develop asthma at any time. Some people may have asthma all of their lives, while others may develop it as adults.  What causes asthma? The airways in a person with asthma are very sensitive and react to many things, or "triggers." Contact with these triggers causes asthma symptoms. One of the most important parts of asthma control is to identify your triggers and then avoid them when possible. The only trigger you do not want to avoid is exercise. Pre-treatment with medicines before exercise can allow you to stay active yet avoid asthma symptoms. Common asthma triggers include: Infections (colds, viruses, flu, sinus infections)  Exercise  Weather (changes in temperature and/or humidity, cold air)  Tobacco smoke  Allergens (dust mites, pollens, pets, mold spores, cockroaches, and sometimes foods)  Irritants (strong odors from cleaning products, perfume, wood smoke, air pollution)  Strong emotions such as crying or laughing hard  Some medications   How is asthma diagnosed? To diagnose asthma, your doctor will first review your medical history, family history, and symptoms. Your doctor will want to know any past history of breathing problems you may have had, as well as a family history of asthma, allergies, eczema (a bumpy, itchy skin rash caused by  allergies), or other lung disease. It is important that you describe your symptoms in detail (cough, wheeze, shortness of breath, chest tightness), including when and how often they occur. The doctor will perform a physical examination and listen to your heart and lungs. He or she may also order breathing tests, allergy tests, blood tests, and chest and sinus X-rays. The tests will find out if you do have asthma and if there are any other conditions that are contributing factors.  How is asthma treated? Asthma can be controlled, but not cured. It is not normal to have frequent symptoms, trouble sleeping, or trouble completing tasks. Appropriate asthma care will prevent symptoms and visits to the emergency room and hospital. Asthma medicines are one of the mainstays of asthma treatment. The drugs used to treat asthma are explained below.  Anti-inflammatories: These are the most important drugs for most people with asthma. Anti-inflammatory drugs reduce swelling and mucus production in the airways. As a result, airways are less sensitive and less likely to react to triggers. These medications need to be taken daily and may need to be taken for several weeks before they begin to control asthma. Anti-inflammatory medicines lead to fewer symptoms, better airflow, less sensitive airways, less airway damage, and fewer asthma attacks. If taken every day, they CONTROL or prevent asthma symptoms.   Bronchodilators: These drugs relax the muscle bands that tighten around the airways. This action opens the airways, letting more air in and out of the lungs and improving breathing. Bronchodilators also help clear mucus from the lungs. As the airways open, the mucus moves more freely and can be coughed out more easily. In short-acting forms, bronchodilators RELIEVE or stop asthma symptoms by quickly opening the airways and are very helpful during an asthma episode. In long-acting forms, bronchodilators provide CONTROL of  asthma symptoms and prevent asthma episodes.  Asthma drugs can be taken in a variety of ways. Inhaling the medications by using a metered dose inhaler, dry powder inhaler, or nebulizer is one way of taking asthma medicines. Oral medicines (pills or liquids you swallow) may also be prescribed.  Asthma severity Asthma is classified as either "intermittent" (comes and goes) or "persistent" (lasting). Persistent asthma is further described as being mild, moderate, or severe. The severity of asthma is based on how often you have symptoms both during the day and night, as well as by the results of lung function tests and by how well you can perform activities. The "severity" of asthma refers to how "intense" or "strong" your asthma is.  Asthma control Asthma control is the goal of asthma treatment. Regardless of your asthma severity, it may or may not be controlled. Asthma control means: You are able to do everything you want to do at work and home  You have no (or minimal) asthma symptoms  You do not wake up from your sleep or earlier than usual in the morning due to asthma  You rarely need to use your reliever medicine (inhaler)  Another major part of your treatment is that you are happy with your asthma care and believe your asthma is  controlled.  Monitoring symptoms A key part of treatment is keeping track of how well your lungs are working. Monitoring your symptoms  what they are, how and when they happen, and how severe they are  is an important part of being able to control your asthma.  Sometimes asthma is monitored using a peak flow meter. A peak flow (PF) meter measures how fast the air comes out of your lungs. It can help you know when your asthma is getting worse, sometimes even before you have symptoms. By taking daily peak flow readings, you can learn when to adjust medications to keep asthma under good control. It is also used to create your asthma action plan (see below). Your doctor  can use your peak flow readings to adjust your treatment plan in some cases.  Asthma Action Plan Based on your history and asthma severity, you and your doctor will develop a care plan called an "asthma action plan." The asthma action plan describes when and how to use your medicines, actions to take when asthma worsens, and when to seek emergency care. Make sure you understand this plan. If you do not, ask your asthma care provider any questions you may have. Your asthma action plan is one of the keys to controlling asthma. Keep it readily available to remind you of what you need to do every day to control asthma and what you need to do when symptoms occur.  Goals of asthma therapy These are the goals of asthma treatment: Live an active, normal life  Prevent chronic and troublesome symptoms  Attend work or school every day  Perform daily activities without difficulty  Stop urgent visits to the doctor, emergency department, or hospital  Use and adjust medications to control asthma with few or no side effects

## 2021-09-22 ENCOUNTER — Other Ambulatory Visit (HOSPITAL_COMMUNITY): Payer: Self-pay

## 2021-10-05 ENCOUNTER — Telehealth: Payer: Self-pay | Admitting: *Deleted

## 2021-10-05 NOTE — Telephone Encounter (Signed)
Seth Kerr is not covered under patient's insurance.  He was on Breo previously, however, Dr. Shearon Stalls wants him on something comparable to [facility].  Please advise on what comparable inhaler is covered by his plan.  Thank you.

## 2021-10-05 NOTE — Addendum Note (Signed)
Addended by: Vanessa Barbara on: 10/05/2021 05:06 PM   Modules accepted: Orders

## 2021-10-05 NOTE — Telephone Encounter (Signed)
Called [facility] pharmacy, advised that the [facility] was not covered, but the [facility] is covered and has a $40 copay.  He was able to pick it up last month and is due for a refill.  Nothing further needed.

## 2021-10-06 ENCOUNTER — Other Ambulatory Visit (HOSPITAL_COMMUNITY): Payer: Self-pay

## 2021-10-06 MED ORDER — FLUTICASONE-SALMETEROL 500-50 MCG/ACT IN AEPB: 1.0000 | INHALATION_SPRAY | Freq: Two times a day (BID) | RESPIRATORY_TRACT | 12 refills | Status: DC

## 2021-10-06 MED ORDER — ADVAIR HFA 230-21 MCG/ACT IN AERO: 2.0000 | INHALATION_SPRAY | Freq: Two times a day (BID) | RESPIRATORY_TRACT | 12 refills | Status: DC

## 2021-10-06 NOTE — Telephone Encounter (Signed)
Patient's plan prefers brand inhalers.  Ran test claims for 1 month supply. Copays:  Symbicort- $85.00 Advair Diskus- $35.00 Advair Hfa- $85.00 Breo- $85.00 Dulera- $85.00 Airduo Digihaler- $105.00

## 2021-10-06 NOTE — Telephone Encounter (Signed)
I have called and LM on VM for pt to call back.  

## 2021-10-07 NOTE — Telephone Encounter (Signed)
Patient is returning phone call. Patient phone number is [phone].

## 2021-10-07 NOTE — Telephone Encounter (Signed)
I have called and LM On VM to have pt to call the office back.  Will need to let him know that advair disc was sent to the pharmacy per his insurance coverage.

## 2021-10-10 NOTE — Telephone Encounter (Signed)
Called and spoke with patient, provided information per pharmacy regarding medications and copay.  Advised I would send in the Advair Diskus to his pharmacy, pharmacy verified with patient.

## 2021-10-10 NOTE — Telephone Encounter (Signed)
Dr. Shearon Stalls, Which strength Advair Diskus do you want sent in to the patient's pharmacy?

## 2021-10-10 NOTE — Telephone Encounter (Signed)
I called the patient and he is going to check with the pharmacy for his medication refill. Nothing further needed.

## 2021-11-20 HISTORY — PX: OTHER SURGICAL HISTORY: SHX169

## 2021-11-22 NOTE — Progress Notes (Signed)
Chief Complaint  Patient presents with   Annual Exam    Fasting annual exam, no concerns. Does not want covid booster. Does want shingles vaccine but has plans tomorrow, will schedule NV for next Friday.    Seth Kerr is a 51 y.o. male who presents for a complete physical.    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  He last saw Dr. Shearon Stalls in November and had Breo changed to [place]. He reports he is taking this 2 puffs once daily (rather than 2 P BID as prescribed).  He remains on Dupixent and montelukast. He is doing well on this regimen, not needing albuterol. He remains very active with running, some biking, and hasn't had any problems.   Allergies: He no longer sees Dr. Donneta Romberg, no longer getting immunotherapy. He takes Human resources officer every other daily currently (takes it daily seasonally) and montelukast daily. Takes Nasonex seasonally only, not needing now.  ED comes and goes. Has used Viagra in the past with good results.    Immunization History  Administered Date(s) Administered   Influenza Split 09/12/2012, 08/29/2014, 09/05/2016   Influenza,inj,Quad PF,6+ Mos 10/09/2013, 09/03/2015, 08/29/2017, 08/28/2018, 08/25/2019, 08/18/2020, 09/21/2021   PFIZER Comirnaty(Gray Top)Covid-19 Tri-Sucrose Vaccine 05/12/2021   PFIZER(Purple Top)SARS-COV-2 Vaccination 01/21/2020, 02/11/2020   Pneumococcal Polysaccharide-23 05/05/2015   Tdap 10/04/2010, 04/22/2014  Had 1 COVID booster in June, hasn't had the bivalent booster (declines). Last colonoscopy: 01/2021 with Dr. Tarri Glenn, normal. Last PSA:  Lab Results  Component Value Date   PSA1 1.1 11/11/2020  Dentist: twice yearly Optho: every 2 years, wears glasses Exercise: 5 days/week or more, bikes, runs, walks (swimming when training for triathlons).  Didn't have events last year, so didn't do as much.  Training for [employer] 1/2 marathon in March, and triathlon in June.  Push-ups, light  weights. Was "lazy" last year (since wasn't training for races). He was drinking more during the week over the last year, recently cut back. Was having 2-3 drinks/night. He is aware of his weight gain  Lipids: Lab Results  Component Value Date   CHOL 244 (H) 11/11/2020   HDL 117 11/11/2020   LDLCALC 118 (H) 11/11/2020   TRIG 56 11/11/2020   CHOLHDL 2.1 11/11/2020   Vitamin D normal at 44 in 2015 Normal thyroid screen 2015  CBC in 07/2021 through pulm showed eosinophilia, otherwise normal Lab Results  Component Value Date   WBC 8.3 08/10/2021   HGB 15.0 08/10/2021   HCT 44.4 08/10/2021   MCV 91.0 08/10/2021   PLT 226.0 08/10/2021    PMH, PSH, SH and FH were reviewed and updated  Outpatient Encounter Medications as of 11/24/2021  Medication Sig Note   Cholecalciferol (VITAMIN D) 50 MCG (2000 UT) CAPS Take 2,000 Units by mouth daily.    Dupilumab (DUPIXENT) 300 MG/2ML SOPN Inject 300 mg into the skin every 14 (fourteen) days.    fexofenadine (ALLEGRA) 180 MG tablet Take 180 mg by mouth every other day. 11/24/2021: Takes daily seasonally   fluticasone-salmeterol (ADVAIR DISKUS) 500-50 MCG/ACT AEPB Inhale 1 puff into the lungs in the morning and at bedtime. 11/24/2021: Has been using 2 puffs in the morning   montelukast (SINGULAIR) 10 MG tablet Take 10 mg by mouth at bedtime.    Multiple Vitamins-Minerals (MULTIVITAMIN MEN PO) Take 1 capsule by mouth daily.    sildenafil (VIAGRA) 100 MG tablet Take 0.5-1 tablets (50-100 mg total) by mouth daily as needed for erectile dysfunction. 11/24/2021: Has used  in the last 24hrs   albuterol (PROVENTIL) (2.5 MG/3ML) 0.083% nebulizer solution Take 3 mLs (2.5 mg total) by nebulization every 6 (six) hours as needed for wheezing or shortness of breath. (Patient not taking: Reported on 11/24/2021) 11/24/2021: Uses infrequently.    albuterol (VENTOLIN HFA) 108 (90 Base) MCG/ACT inhaler Inhale 2 puffs into the lungs every 6 (six) hours as needed for wheezing or  shortness of breath. (Patient not taking: Reported on 11/24/2021) 11/24/2021: Uses as needed   [DISCONTINUED] benzonatate (TESSALON) 200 MG capsule Take 1 capsule (200 mg total) by mouth 3 (three) times daily as needed for cough.    [DISCONTINUED] EPIPEN 2-PAK 0.3 MG/0.3ML SOAJ injection Use as directed (Patient not taking: Reported on 11/24/2021)    [DISCONTINUED] PE-Triprolidine-DM-GG-APAP (MUCINEX FAST-MAX DAY/NIGHT MS) LQPK Take 15 mLs by mouth as needed. 06/20/2021: Daytime-last dose yesterday am   No facility-administered encounter medications on file as of 11/24/2021.   Allergies  Allergen Reactions   Aspirin Other (See Comments)    Not actual allergy, had chronic nosebleeds as a child and was instructed not to use aspirin.    ROS:  The patient denies anorexia, headaches, vision loss, decreased hearing, ear pain, hoarseness, palpitations, dizziness, syncope, dyspnea on exertion, cough, swelling, nausea, vomiting, diarrhea, constipation, abdominal pain, melena, hematochezia, indigestion/heartburn, hematuria, incontinence, numbness, tingling, weakness, tremor, suspicious skin lesions, depression, anxiety, abnormal bleeding/bruising, or enlarged lymph nodes ED intermittently. Asthma/allergies are controlled. +weight gain.    PHYSICAL EXAM:  BP 120/74    Pulse 64    Ht 5\' 11"  (1.803 m)    Wt 209 lb 3.2 oz (94.9 kg)    BMI 29.18 kg/m   Wt Readings from Last 3 Encounters:  11/24/21 209 lb 3.2 oz (94.9 kg)  09/21/21 210 lb (95.3 kg)  08/10/21 204 lb (92.5 kg)  11/11/20 194# 12.8 oz  General Appearance:    Alert, cooperative, no distress, appears stated age    Head:      Normocephalic, without obvious abnormality, atraumatic    Eyes:      PERRL, conjunctiva/corneas clear, EOM's intact, fundi benign    Ears:      Normal TM's and external ear canals    Nose:     Not examined, wearing mask due to COVID-19 pandemic   Throat:     Not examined, wearing mask due to COVID-19 pandemic   Neck:      Supple, no lymphadenopathy;  thyroid:  no enlargement/tenderness/ nodules; no carotid bruit or JVD    Back:      Spine nontender, no curvature, ROM normal, no CVA tenderness    Lungs:      Respirations unlabored, good air movement. No wheezing, rales, ronchi   Chest Wall:      No tenderness or deformity     Heart:      Regular rate and rhythm, S1 and S2 normal, no murmur, rub or gallop    Breast Exam:      No chest wall tenderness, masses or gynecomastia    Abdomen:       Soft, non-tender, nondistended, normoactive bowel sounds, no masses, no hepatosplenomegaly    Genitalia:      Normal male external genitalia without lesions.  Testicles without masses.  No inguinal hernias.    Rectal:      Normal sphincter tone, no masses or tenderness; heme negative stool.  Prostate smooth, no nodules, not enlarged.    Extremities:     No clubbing, cyanosis or edema.  Pulses:     2+ and symmetric all extremities    Skin:     Skin color, texture, turgor normal.  There is a macular lesion at his right forearm, hyperpigmented but also slightly pink. Has appt with derm for eval.  Lymph nodes:     Cervical, supraclavicular, and axillary nodes normal    Neurologic:     Normal strength, sensation and gait; reflexes 2+ and symmetric throughout                             Psych:   Normal mood, affect, hygiene and grooming    ASSESSMENT/PLAN:  Annual physical exam - Plan: POCT Urinalysis DIP (Proadvantage Device), Comprehensive metabolic panel, PSA  Erectile dysfunction, unspecified erectile dysfunction type - Plan: sildenafil (VIAGRA) 100 MG tablet  Severe persistent asthma without complication - doing well on current regimen.  Discussed 1P BID of Advair and reasons for BID dosing.  Seasonal allergies - controlled  Screening for prostate cancer - Plan: PSA  Discussed PSA screening (risks/benefits); recommended at least 30 minutes of aerobic activity at least 5 days/week, weight-bearing exercise at least  2x/week; proper sunscreen use reviewed; healthy diet and alcohol recommendations (less than or equal to 2 drinks/day) reviewed; regular seatbelt use; changing batteries in smoke detectors, carbon monoxide detectors ecommended. Immunization recommendations discussed--continue yearly flu shots. Shingrix recommended, risks/SE reviewed. COVID bivalent booster recommended, declined.  Colonoscopy recommendations reviewed--UTD   F/u 1 year for CPE, sooner prn. NV for shingrix

## 2021-11-22 NOTE — Patient Instructions (Addendum)
°  HEALTH MAINTENANCE RECOMMENDATIONS:  It is recommended that you get at least 30 minutes of aerobic exercise at least 5 days/week (for weight loss, you may need as much as 60-90 minutes). This can be any activity that gets your heart rate up. This can be divided in 10-15 minute intervals if needed, but try and build up your endurance at least once a week.  Weight bearing exercise is also recommended twice weekly.  Eat a healthy diet with lots of vegetables, fruits and fiber.  "Colorful" foods have a lot of vitamins (ie green vegetables, tomatoes, red peppers, etc).  Limit sweet tea, regular sodas and alcoholic beverages, all of which has a lot of calories and sugar.  Up to 2 alcoholic drinks daily may be beneficial for men (unless trying to lose weight, watch sugars).  Drink a lot of water.  Sunscreen of at least SPF 30 should be used on all sun-exposed parts of the skin when outside between the hours of 10 am and 4 pm (not just when at beach or pool, but even with exercise, golf, tennis, and yard work!)  Use a sunscreen that says "broad spectrum" so it covers both UVA and UVB rays, and make sure to reapply every 1-2 hours.  Remember to change the batteries in your smoke detectors when changing your clock times in the spring and fall.  Carbon monoxide detectors are recommended for your home.  Use your seat belt every time you are in a car, and please drive safely and not be distracted with cell phones and texting while driving.   I recommend getting the new shingles vaccine (Shingrix). You may want to check with your insurance to verify what your out of pocket cost may be (usually covered as preventative, but better to verify to avoid any surprises, as this vaccine is expensive), and then schedule a nurse visit at our office when convenient (based on the possible side effects as discussed).   This is a series of 2 injections, spaced 2 months apart.  It doesn't have to be exactly 2 months apart (but  can't be sooner), if that isn't feasible for your schedule, but try and get them close to 2 months (and definitely within 6 months of each other, or else the efficacy of the vaccine drops off). This should be separated from other vaccines by at least 2 weeks.   Change your Advair to 1 puff twice daily.  If you have a problem remembering the evening dose, ask your pulmonary docs about a once daily combined steroid-LABA medication.  Consider another pneumonia booster. You can check with your lung docs to see if they have a preference. You may need to check to see if your insurance covers the new Prevnar-13 (vs giving a pneumovax, vs waiting for 7-10 years; your last was in 04/2015).

## 2021-11-24 ENCOUNTER — Encounter: Payer: Self-pay | Admitting: Family Medicine

## 2021-11-24 ENCOUNTER — Ambulatory Visit (INDEPENDENT_AMBULATORY_CARE_PROVIDER_SITE_OTHER): Payer: 59 | Admitting: Family Medicine

## 2021-11-24 ENCOUNTER — Other Ambulatory Visit: Payer: Self-pay

## 2021-11-24 VITALS — BP 120/74 | HR 64 | Ht 71.0 in | Wt 209.2 lb

## 2021-11-24 DIAGNOSIS — J4550 Severe persistent asthma, uncomplicated: Secondary | ICD-10-CM

## 2021-11-24 DIAGNOSIS — N529 Male erectile dysfunction, unspecified: Secondary | ICD-10-CM | POA: Diagnosis not present

## 2021-11-24 DIAGNOSIS — J302 Other seasonal allergic rhinitis: Secondary | ICD-10-CM | POA: Diagnosis not present

## 2021-11-24 DIAGNOSIS — Z125 Encounter for screening for malignant neoplasm of prostate: Secondary | ICD-10-CM

## 2021-11-24 DIAGNOSIS — Z0000 Encounter for general adult medical examination without abnormal findings: Secondary | ICD-10-CM | POA: Diagnosis not present

## 2021-11-24 LAB — POCT URINALYSIS DIP (PROADVANTAGE DEVICE)
Bilirubin, UA: NEGATIVE
Blood, UA: NEGATIVE
Glucose, UA: NEGATIVE mg/dL
Ketones, POC UA: NEGATIVE mg/dL
Leukocytes, UA: NEGATIVE
Nitrite, UA: NEGATIVE
Protein Ur, POC: NEGATIVE mg/dL
Specific Gravity, Urine: 1.005
Urobilinogen, Ur: NEGATIVE
pH, UA: 6 (ref 5.0–8.0)

## 2021-11-24 MED ORDER — SILDENAFIL CITRATE 100 MG PO TABS: 50.0000 mg | ORAL_TABLET | Freq: Every day | ORAL | 11 refills | Status: DC | PRN

## 2021-11-25 LAB — COMPREHENSIVE METABOLIC PANEL
ALT: 44 IU/L (ref 0–44)
AST: 37 IU/L (ref 0–40)
Albumin/Globulin Ratio: 2.2 (ref 1.2–2.2)
Albumin: 4.9 g/dL (ref 3.8–4.9)
Alkaline Phosphatase: 59 IU/L (ref 44–121)
BUN/Creatinine Ratio: 17 (ref 9–20)
BUN: 17 mg/dL (ref 6–24)
Bilirubin Total: 0.9 mg/dL (ref 0.0–1.2)
CO2: 23 mmol/L (ref 20–29)
Calcium: 9.6 mg/dL (ref 8.7–10.2)
Chloride: 99 mmol/L (ref 96–106)
Creatinine, Ser: 1.01 mg/dL (ref 0.76–1.27)
Globulin, Total: 2.2 g/dL (ref 1.5–4.5)
Glucose: 94 mg/dL (ref 70–99)
Potassium: 4.8 mmol/L (ref 3.5–5.2)
Sodium: 136 mmol/L (ref 134–144)
Total Protein: 7.1 g/dL (ref 6.0–8.5)
eGFR: 90 mL/min/{1.73_m2} (ref >59)

## 2021-11-25 LAB — PSA: Prostate Specific Ag, Serum: 1.3 ng/mL (ref 0.0–4.0)

## 2022-01-11 ENCOUNTER — Ambulatory Visit (INDEPENDENT_AMBULATORY_CARE_PROVIDER_SITE_OTHER): Payer: 59 | Admitting: Dermatology

## 2022-01-11 ENCOUNTER — Other Ambulatory Visit: Payer: Self-pay

## 2022-01-11 DIAGNOSIS — D0361 Melanoma in situ of right upper limb, including shoulder: Secondary | ICD-10-CM

## 2022-01-11 DIAGNOSIS — L738 Other specified follicular disorders: Secondary | ICD-10-CM

## 2022-01-11 DIAGNOSIS — D485 Neoplasm of uncertain behavior of skin: Secondary | ICD-10-CM

## 2022-01-11 DIAGNOSIS — C439 Malignant melanoma of skin, unspecified: Secondary | ICD-10-CM

## 2022-01-11 DIAGNOSIS — D1801 Hemangioma of skin and subcutaneous tissue: Secondary | ICD-10-CM | POA: Diagnosis not present

## 2022-01-11 DIAGNOSIS — Z1283 Encounter for screening for malignant neoplasm of skin: Secondary | ICD-10-CM

## 2022-01-11 HISTORY — DX: Malignant melanoma of skin, unspecified: C43.9

## 2022-01-11 NOTE — Patient Instructions (Signed)

## 2022-01-17 ENCOUNTER — Telehealth: Payer: Self-pay | Admitting: Dermatology

## 2022-01-17 NOTE — Telephone Encounter (Signed)
Seth Kerr with [facility]ology left message on office voice mail that she was calling with results on patient.

## 2022-01-17 NOTE — Telephone Encounter (Signed)
MIS PER GPA,waiting on the final report

## 2022-01-17 NOTE — Telephone Encounter (Signed)
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 If I have no surgical appointments available in a reasonable time period, he is OK with having Dr. Link Snuffer or Danielle Dess do the excision. He will be out of town April 19-20, 2023 and we'll avoid scheduling within 2-3 weeks of his trip. If we do not contact by this Monday March 6 he will call my office. He has my cell # and can contact me any time with any questions.

## 2022-01-19 ENCOUNTER — Telehealth: Payer: Self-pay | Admitting: *Deleted

## 2022-01-19 ENCOUNTER — Encounter: Payer: Self-pay | Admitting: *Deleted

## 2022-01-19 NOTE — Telephone Encounter (Signed)
-----   Message from Lavonna Monarch, MD sent at 01/18/2022  3:57 AM EST ----- ?I spoke with Mr. Vanvoorhis yesterday and documented this in the communication record.  He should not have the surgery scheduled between April 5 and April 21.  If I do not have an hour surgical time slot he is fine with having this done by Dr. Danielle Dess or Dr. Link Snuffer at the skin surgery center. ?

## 2022-01-19 NOTE — Telephone Encounter (Signed)
-----   Message from Lavonna Monarch, MD sent at 01/18/2022  3:57 AM EST ----- ?I spoke with Mr. Weide yesterday and documented this in the communication record.  He should not have the surgery scheduled between April 5 and April 21.  If I do not have an hour surgical time slot he is fine with having this done by Dr. Danielle Dess or Dr. Link Snuffer at the skin surgery center. ?

## 2022-01-19 NOTE — Telephone Encounter (Signed)
Patient information sent to the skin surgery center for mohs. Marked as urgent.  ?

## 2022-01-23 ENCOUNTER — Encounter: Payer: Self-pay | Admitting: Dermatology

## 2022-01-23 NOTE — Progress Notes (Signed)
° °  New Patient   Subjective  Seth Kerr is a 51 y.o. male who presents for the following: Skin Problem (Pt has a spot of concern on the R forearm that he and his wife believe has gotten bigger, no discomfort/).  General skin check, change in spot on right forearm Location:  Duration:  Quality:  Associated Signs/Symptoms: Modifying Factors:  Severity:  Timing: Context:    The following portions of the chart were reviewed this encounter and updated as appropriate:  Tobacco   Allergies   Meds   Problems   Med Hx   Surg Hx   Fam Hx       Objective  Well appearing patient in no apparent distress; mood and affect are within normal limits. Scalp Waist up skin exam: 1 atypical lesion right forearm, otherwise no suspicious pigmented spots or nonmelanoma skin cancer.  Right Abdomen (side) - Upper Multiple 2 mm smooth red dermal papules  Mid Forehead Several subtle 1 to 2 mm flesh-colored papules with compatible dermoscopy.  Right Forearm - Posterior 1 cm pink plaques focal to a.m. irregular macule, dermoscopy shows this to be a melanocytic lesion with atypia.         All skin waist up examined.   Assessment & Plan  Encounter for screening for malignant neoplasm of skin Scalp  Annual skin examination, encouraged to self examine twice annually.  Continued ultraviolet protection.  Cherry angioma Right Abdomen (side) - Upper  No intervention necessary  Sebaceous gland hyperplasia Mid Forehead  Told of similar appearance of early BCC so if there is growth or bleeding return for biopsy  Neoplasm of uncertain behavior of skin Right Forearm - Posterior  Skin / nail biopsy Type of biopsy: tangential   Informed consent: discussed and consent obtained   Timeout: patient name, date of birth, surgical site, and procedure verified   Anesthesia: the lesion was anesthetized in a standard fashion   Anesthetic:  1% lidocaine w/ epinephrine 1-100,000 local  infiltration Instrument used: flexible razor blade   Hemostasis achieved with: aluminum chloride and electrodesiccation   Outcome: patient tolerated procedure well   Post-procedure details: wound care instructions given    Specimen 1 - Surgical pathology Differential Diagnosis: bcc vs scc  Check Margins: No

## 2022-02-27 ENCOUNTER — Telehealth: Payer: Self-pay

## 2022-02-27 NOTE — Telephone Encounter (Signed)
Received fax from [employer] stating that pt's current PA is expiring. ? ?Submitted a Prior Authorization request to [facility] for [place] via CoverMyMeds. Will update once we receive a response. ? ? ?Key: [id number] ?

## 2022-03-01 NOTE — Telephone Encounter (Signed)
Received notification from [facility] regarding a prior authorization for Seth Kerr. Authorization has been APPROVED from 02/28/2022 to 02/28/2023. Approval letter sent to scan center. ? ?Authorization # [id number] ?

## 2022-04-01 ENCOUNTER — Other Ambulatory Visit: Payer: Self-pay | Admitting: Family Medicine

## 2022-04-01 DIAGNOSIS — J4550 Severe persistent asthma, uncomplicated: Secondary | ICD-10-CM

## 2022-04-01 DIAGNOSIS — J302 Other seasonal allergic rhinitis: Secondary | ICD-10-CM

## 2022-04-03 ENCOUNTER — Other Ambulatory Visit (HOSPITAL_COMMUNITY): Payer: Self-pay

## 2022-04-03 MED ORDER — MONTELUKAST SODIUM 10 MG PO TABS
10.0000 mg | ORAL_TABLET | Freq: Every day | ORAL | 2 refills | Status: DC
  Filled 2022-04-03: qty 30, 30d supply, fill #0
  Filled 2022-05-09: qty 30, 30d supply, fill #1

## 2022-05-10 ENCOUNTER — Other Ambulatory Visit (HOSPITAL_COMMUNITY): Payer: Self-pay

## 2022-05-31 ENCOUNTER — Encounter: Payer: Self-pay | Admitting: Internal Medicine

## 2022-05-31 ENCOUNTER — Ambulatory Visit (INDEPENDENT_AMBULATORY_CARE_PROVIDER_SITE_OTHER): Payer: 59 | Admitting: Internal Medicine

## 2022-05-31 VITALS — BP 118/72 | HR 58 | Ht 71.0 in | Wt 205.0 lb

## 2022-05-31 DIAGNOSIS — J4550 Severe persistent asthma, uncomplicated: Secondary | ICD-10-CM | POA: Diagnosis not present

## 2022-05-31 MED ORDER — ALBUTEROL SULFATE HFA 108 (90 BASE) MCG/ACT IN AERS: 2.0000 | INHALATION_SPRAY | Freq: Four times a day (QID) | RESPIRATORY_TRACT | 5 refills | Status: DC | PRN

## 2022-05-31 NOTE — Progress Notes (Signed)
Seth Kerr    [id number]    [birth year]  Primary Care Physician:Knapp, Tera Helper, MD Date of Appointment: 05/31/2022 Established Patient Visit  Chief complaint:   Chief Complaint  Patient presents with   Follow-up    F/U for asthma. States he has been doing well since last visit.      HPI: Seth Kerr is a 51 y.o. man with severe persistent asthma on dupixent started Fall 2021.   Interval Updates: Here for 6 month follow up for severe persistent asthma.   Feeling great. Has been competing in triathalons. No asthma flares. Minimal albuterol use.  No issues with dupixent.  Asking if moving to an area with less aeroallergens would be useful in asthma management, and inquiring about tapering or dose reduction in dupixent.   Current Regimen: advair HFA 230 1 puff twice a day, albuterol prn, minimal use.  Asthma Triggers: environmental allergies Exacerbations in the last year: has needed 3 times in the past year.  History of hospitalization or intubation: Allergy Testing: has had SCIT in the past with Dr. Donneta Kerr.  GERD: denies Allergic Rhinitis: yes, seasonal allergies. Takes allegra, montelukast.  ACT:  Asthma Control Test ACT Total Score  05/31/2022  3:22 PM 20  09/21/2021  3:16 PM 22  08/10/2021  2:06 PM 12   FeNO: 33 ppb in September 2022, 7 ppb today   He has daily chronic cough. Cough has improved with allegra. Now just bothers him sometimes at night.    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  Passive smoke exposure in childhood, 4 dogs at home.   I have reviewed the patient's family social and past medical history and updated as appropriate.   Past Medical History:  Diagnosis Date   Allergic rhinitis, cause unspecified    immunotherapy x 47yr (stopped end of 2015), per Dr. SDonneta Kerr  Asthma    COVID-19 virus infection 09/29/2019   Erectile dysfunction    Irritable bowel syndrome (IBS)    has had 2 colonoscopies   Melanoma  ([place] 01/11/2022   right forearm posterior   Onychomycosis    s/p lamisil x 2 courses    Past Surgical History:  Procedure Laterality Date   [place]   SEPTOPLASTY  approx age 51  SHOULDER SURGERY Right    TONSILLECTOMY  age 51  VASECTOMY      Family History  Problem Relation Age of Onset   Cancer Mother        lung cancer, smoker   Alcohol abuse Mother    Cancer Father        stomach cancer, smoker   Alcohol abuse Father    Stomach cancer Father    Asthma Daughter    Diabetes Neg Hx    Heart disease Neg Hx    Esophageal cancer Neg Hx    Rectal cancer Neg Hx    Colon cancer Neg Hx    Colon polyps Neg Hx     Social History   Occupational History   Occupation: [employer]   [place] Tobacco Use   Smoking status: Never   Smokeless tobacco: Never  Vaping Use   Vaping Use: Never used  Substance and Sexual Activity   Alcohol use: Yes    Comment: 2 glasses of wine (or vodka soda) 5-7x/week; recently stopped  Drug use: No   Sexual activity: Yes    Partners: Female     Physical Exam: Blood pressure 118/72, pulse (!) 58, height '5\' 11"'$  (1.803 m), weight 205 lb (93 kg), SpO2 97 %.  Gen:      No acute distress Lungs:    ctab no wheezes or crackles CV:         RRR no mrg   Data Reviewed: Imaging: I have personally reviewed the chest xray 07/2021 no acute process, mild peribronchial thickening.  PFTs:     Latest Ref Rng & Units 09/21/2021    2:01 PM 06/15/2016   12:49 PM  PFT Results  FVC-Pre L 4.27  4.26   FVC-Predicted Pre % 84  82   FVC-Post L 4.43  4.54   FVC-Predicted Post % 88  88   Pre FEV1/FVC % % 78  80   Post FEV1/FCV % % 82  83   FEV1-Pre L 3.34  3.42   FEV1-Predicted Pre % 85  84   FEV1-Post L 3.62  3.76   DLCO uncorrected ml/min/mmHg 36.28  41.88   DLCO UNC% % 124  129   DLCO corrected ml/min/mmHg 36.28    DLCO COR %Predicted %  124    DLVA Predicted % 133  153   TLC L 6.02  6.27   TLC % Predicted % 86  90   RV % Predicted % 82  96    I have personally reviewed the patient's PFTs and today showed normal pulmonary function.  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  continue advair 230 1 puff twice day. Can try coming down to one puff once a day if no worsening symptoms.  continue albuterol prn.  continue daily montelukast and allegra.  I would consider tapering dupixent if stable off prednisone for a year.  I spent 30 minutes in the care of this patient today including pre-charting, chart review, review of results, face-to-face care, coordination of care and communication with consultants etc.).   Return to Care: Return in about 6 months (around 12/01/2022).   [name], MD Pulmonary and [place]

## 2022-05-31 NOTE — Patient Instructions (Addendum)
Please schedule follow up scheduled with myself in 6 months.  If my schedule is not open yet, we will contact you with a reminder closer to that time. Please call [phone] if you haven't heard from [place] a month before.    Continue your advair - can bump down to 1 once a day and see if symptoms are stable.   Continue dupixent and allergy meds.   Maybe if stable and off prednisone for a year we can talk about tapering dupixent off.   Good luck with your races!

## 2022-06-06 ENCOUNTER — Telehealth: Payer: Self-pay | Admitting: Family Medicine

## 2022-06-06 NOTE — Telephone Encounter (Signed)
Pt would like refills montelukast sent to [facility] for 90 days, he has to get it filled there now.  I gave verbal ok #90 since pt already has refills approved.

## 2022-06-07 ENCOUNTER — Other Ambulatory Visit: Payer: Self-pay | Admitting: *Deleted

## 2022-06-07 DIAGNOSIS — J302 Other seasonal allergic rhinitis: Secondary | ICD-10-CM

## 2022-06-07 DIAGNOSIS — J4550 Severe persistent asthma, uncomplicated: Secondary | ICD-10-CM

## 2022-06-07 MED ORDER — MONTELUKAST SODIUM 10 MG PO TABS: 10.0000 mg | ORAL_TABLET | Freq: Every day | ORAL | 1 refills | Status: DC

## 2022-07-02 ENCOUNTER — Other Ambulatory Visit: Payer: Self-pay | Admitting: Internal Medicine

## 2022-07-26 ENCOUNTER — Encounter: Payer: Self-pay | Admitting: Internal Medicine

## 2022-08-29 ENCOUNTER — Encounter: Payer: Self-pay | Admitting: Internal Medicine

## 2022-08-30 ENCOUNTER — Other Ambulatory Visit (HOSPITAL_COMMUNITY): Payer: Self-pay

## 2022-08-30 ENCOUNTER — Telehealth: Payer: Self-pay | Admitting: Internal Medicine

## 2022-08-30 ENCOUNTER — Telehealth: Payer: Self-pay

## 2022-08-30 NOTE — Telephone Encounter (Signed)
Received notification for Pa renewal in CMM through [facility].  Received notification from [place] regarding a prior authorization for [place]. Authorization has been APPROVED from 07/31/2022 to 08/30/2023.   Per test claim, copay for 28 days supply is $76.75  Key: [id number]

## 2022-08-30 NOTE — Telephone Encounter (Signed)
Dupixent prior authorization approved through [employer] from 08/30/2022 through 08/30/2023.  Auth # [id number] CMM Key: [id number]  Knox Saliva, PharmD, MPH, BCPS, CPP Clinical Pharmacist (Rheumatology and Pulmonology)

## 2022-09-06 ENCOUNTER — Telehealth: Payer: BC Managed Care – PPO | Admitting: Family Medicine

## 2022-09-06 ENCOUNTER — Encounter: Payer: Self-pay | Admitting: Family Medicine

## 2022-09-06 VITALS — Temp 98.6°F | Ht 71.0 in | Wt 202.0 lb

## 2022-09-06 DIAGNOSIS — J3489 Other specified disorders of nose and nasal sinuses: Secondary | ICD-10-CM

## 2022-09-06 DIAGNOSIS — J4550 Severe persistent asthma, uncomplicated: Secondary | ICD-10-CM

## 2022-09-06 DIAGNOSIS — J302 Other seasonal allergic rhinitis: Secondary | ICD-10-CM

## 2022-09-06 MED ORDER — AZITHROMYCIN 250 MG PO TABS: ORAL_TABLET | ORAL | 0 refills | Status: DC

## 2022-09-06 MED ORDER — PREDNISONE 10 MG (21) PO TBPK: ORAL_TABLET | ORAL | 0 refills | Status: DC

## 2022-09-06 NOTE — Patient Instructions (Signed)
Stay well hydrated. Continue the singulair (montelukast) and allegra for your allergies. I recommend starting a nasal steroid spray (flonase, nasacort, etc). This can take up to 10 days to have the full effect. Continue it through the fall allergy season.  I recommend using pseudoephedrine to help with sinus pain, and dry up the mucus, opening sinus passages.  Mucinex will also help keep the mucus thin, so that it drains from the sinuses better (plain guaifenesin). Sinus rinses (neti-pot or sinus rinse kit, with distilled or boiled water for the saline solution) once or twice daily, should also help with sinus pain/pressure.  Start the antibiotics if you have persistent/worsening sinus pain, with discolored nasal drainage.  Start the steroids if it moves into your chest, and you have more wheezing and shortness of breath.  Or if allergies aren't improving with the above regimen, the steroids will also help.  Be sure to use either sudafed or Afrin nasal spray prior to flying, if still having a lot of head congestion on Monday (to prevent your ears from hurting during takeoff).  I hope you feel better soon, and have a great trip!

## 2022-09-06 NOTE — Progress Notes (Signed)
Start time: 3:15 Started on phone while waiting to re-boot computer after it froze up; performed more than half via video. End time: 3:39  Virtual Visit via Video Note  I connected with Davis Gourd on 09/06/22 by a video enabled telemedicine application and verified that I am speaking with the correct person using two identifiers.  Location: Patient: home Provider: office   I discussed the limitations of evaluation and management by telemedicine and the availability of in person appointments. The patient expressed understanding and agreed to proceed.  History of Present Illness:  Chief Complaint  Patient presents with   Facial Pain    VIRTUAL sinus pain and pressure, gums hurt. No cough. Symptoms started end of last week (Thursday). Neg covid test Monday. No fever, chills or body aches. Air travel next Monday.    He did a lot of yard work last week (mowing grass in [facility] on Wednesday, and here on Friday).  On Thursday (10/12) he started with stuffy nose, scratchy throat, and blowing large amounts out of his nose.  Didn't really look at it, thinks it was maybe a little yellow.  He is having pain in his forehead and his cheeks, and yesterday started having some pain in his gums.  This pain improves some after blowing large volumes from his nose.  He has a history of Fall allergies, recalls getting something similar every October. This feels similar, he is a little achey, but has been active. He has been using Allegra and singulair daily.  He has not been using any nasal steroid spray. Asthma is doing very well on Dupixent and Advair, not needing any albuterol.  He is leaving for [place] on 10/23-10/31.  PMH, PSH, SH reviewed. Meds reviewed Allergies  Allergen Reactions   Aspirin Other (See Comments)    Not actual allergy, had chronic nosebleeds as a child and was instructed not to use aspirin.   ROS: No known fever, chills, no cough, shortness of breath, wheezing. No n/v/d, rash or  other problems.    Observations/Objective:  Temp 98.6 F (37 C) (Temporal)   Ht '5\' 11"'$  (1.803 m)   Wt 202 lb (91.6 kg)   BMI 28.17 kg/m   Well-appearing male, in no distress He is alert and oriented. No sniffling, throat-clearing or coughing during the visit. He is speaking comfortably. Cranial nerves grossly intact.  Alert and oriented. Exam is limited due to virtual nature of the visit,  Assessment and Plan:  Seasonal allergies - start nasal steroid spray. Discussed mucinex, sinus rinses, decongestant, cont allegra, singulair. - Plan: predniSONE (STERAPRED UNI-PAK 21 TAB) 10 MG (21) TBPK tablet  Sinus pain - suspect congestion, not infection.  Prescribed z-pak and prednisone to use if symptoms escalate to infection while traveling. s/sx, risks/SE reviewed - Plan: azithromycin (ZITHROMAX) 250 MG tablet  Severe persistent asthma without complication - doing well on current regimen.  prescribed prednisone taper to have on hand in case of flare while on vacation. - Plan: predniSONE (STERAPRED UNI-PAK 21 TAB) 10 MG (21) TBPK tablet  Zpak and prednisone to have on hand, to bring with traveling. Discussed use of nasal steroid, mucinex, sinus rinses, sudafed.   Follow Up Instructions:    I discussed the assessment and treatment plan with the patient. The patient was provided an opportunity to ask questions and all were answered. The patient agreed with the plan and demonstrated an understanding of the instructions.   The patient was advised to call back or seek an in-person evaluation  if the symptoms worsen or if the condition fails to improve as anticipated.  I spent 30 minutes dedicated to the care of this patient, including pre-visit review of records, face to face time, post-visit ordering of testing and documentation.    Vikki Ports, MD

## 2022-11-02 ENCOUNTER — Other Ambulatory Visit: Payer: Self-pay | Admitting: Internal Medicine

## 2022-11-09 ENCOUNTER — Other Ambulatory Visit: Payer: Self-pay | Admitting: Internal Medicine

## 2022-11-26 NOTE — Patient Instructions (Incomplete)
  HEALTH MAINTENANCE RECOMMENDATIONS:  It is recommended that you get at least 30 minutes of aerobic exercise at least 5 days/week (for weight loss, you may need as much as 60-90 minutes). This can be any activity that gets your heart rate up. This can be divided in 10-15 minute intervals if needed, but try and build up your endurance at least once a week.  Weight bearing exercise is also recommended twice weekly.  Eat a healthy diet with lots of vegetables, fruits and fiber.  "Colorful" foods have a lot of vitamins (ie green vegetables, tomatoes, red peppers, etc).  Limit sweet tea, regular sodas and alcoholic beverages, all of which has a lot of calories and sugar.  Up to 2 alcoholic drinks daily may be beneficial for men (unless trying to lose weight, watch sugars).  Drink a lot of water.  Sunscreen of at least SPF 30 should be used on all sun-exposed parts of the skin when outside between the hours of 10 am and 4 pm (not just when at beach or pool, but even with exercise, golf, tennis, and yard work!)  Use a sunscreen that says "broad spectrum" so it covers both UVA and UVB rays, and make sure to reapply every 1-2 hours.  Remember to change the batteries in your smoke detectors when changing your clock times in the spring and fall.  Carbon monoxide detectors are recommended for your home.  Use your seat belt every time you are in a car, and please drive safely and not be distracted with cell phones and texting while driving.  Try and get your flu shots in September/October in the future.  You are due to see pulmonary, please schedule. Technically you are supposed to take Advair twice daily.  If you notice any issues in the evenings, or with any flares, please increase it to twice a day.  I recommend getting the new shingles vaccine (Shingrix).  This is a series of 2 injections, spaced 2 months apart.  It doesn't have to be exactly 2 months apart (but can't be sooner), if that isn't feasible for  your schedule, but try and get them close to 2 months (and definitely within 6 months of each other, or else the efficacy of the vaccine drops off). This should be separated from other vaccines by at least 2 weeks.  I doubt that your toe pain is gout. We will check a uric acid level. If you have ongoing pain, I recommend going to [facility] for evaluation.

## 2022-11-26 NOTE — Progress Notes (Unsigned)
No chief complaint on file.  Seth Kerr is a 51 y.o. male who presents for a complete physical.    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  He last saw Dr. Shearon Stalls, due now for follow-up (not yet scheduled). He continues on Dupixent, montelukast and Advair (reported using this 2 puffs once daily, rather than 2 P BID as prescribed).  He is doing well on this regimen, not needing albuterol. He remains very active with running, some biking, and hasn't had any problems.   Allergies: He no longer sees Dr. Donneta Romberg, no longer getting immunotherapy. He takes montelukast daily, and Allegra (daily or qod?) and nasal spray UPDATE ***  He last had a bad flare in October.  ED comes and goes. Has used Viagra in the past with good results.    Immunization History  Administered Date(s) Administered   Influenza Split 09/12/2012, 08/29/2014, 09/05/2016   Influenza,inj,Quad PF,6+ Mos 10/09/2013, 09/03/2015, 08/29/2017, 08/28/2018, 08/25/2019, 08/18/2020, 09/21/2021   PFIZER Comirnaty(Gray Top)Covid-19 Tri-Sucrose Vaccine 05/12/2021   PFIZER(Purple Top)SARS-COV-2 Vaccination 01/21/2020, 02/11/2020   Pneumococcal Polysaccharide-23 05/05/2015   Tdap 10/04/2010, 04/22/2014  Declined bivalent COVID booster Last colonoscopy: 01/2021 with Dr. Tarri Glenn, normal. Last PSA:  Lab Results  Component Value Date   PSA1 1.3 11/24/2021   PSA1 1.1 11/11/2020  Dentist: twice yearly Optho: every 2 years, wears glasses Exercise:  5 days/week or more, bikes, runs, walks (swimming when training for triathlons).   Last year ran [facility] 1/2 marathon in March, and did a triathlon in June 2023. Push-ups, light weights.  Lipids: Lab Results  Component Value Date   CHOL 244 (H) 11/11/2020   HDL 117 11/11/2020   LDLCALC 118 (H) 11/11/2020   TRIG 56 11/11/2020   CHOLHDL 2.1 11/11/2020   Vitamin D normal at 45 in 2015 Normal thyroid screen 2015  CBC in 07/2021 through  pulm showed eosinophilia, otherwise normal Lab Results  Component Value Date   WBC 8.3 08/10/2021   HGB 15.0 08/10/2021   HCT 44.4 08/10/2021   MCV 91.0 08/10/2021   PLT 226.0 08/10/2021    PMH, PSH, SH and FH were reviewed and updated    ROS:  The patient denies anorexia, headaches, vision loss, decreased hearing, ear pain, hoarseness, palpitations, dizziness, syncope, dyspnea on exertion, cough, swelling, nausea, vomiting, diarrhea, constipation, abdominal pain, melena, hematochezia, indigestion/heartburn, hematuria, incontinence, numbness, tingling, weakness, tremor, suspicious skin lesions, depression, anxiety, abnormal bleeding/bruising, or enlarged lymph nodes ED intermittently. Asthma/allergies are controlled. Weight gain last year--related to drinking more, exercising a little less (didn't have any races/events the prior year). weight    PHYSICAL EXAM:  There were no vitals taken for this visit.  Wt Readings from Last 3 Encounters:  09/06/22 202 lb (91.6 kg)  05/31/22 205 lb (93 kg)  11/24/21 209 lb 3.2 oz (94.9 kg)  11/24/21  209# 3.2 oz 11/11/20 194# 12.8 oz  General Appearance:    Alert, cooperative, no distress, appears stated age    Head:      Normocephalic, without obvious abnormality, atraumatic    Eyes:      PERRL, conjunctiva/corneas clear, EOM's intact, fundi benign    Ears:      Normal TM's and external ear canals    Nose:     No drainage or sinus tenderness  Throat:     Normal mucosa  Neck:     Supple, no lymphadenopathy;  thyroid:  no enlargement/tenderness/ nodules; no carotid bruit or JVD  Back:      Spine nontender, no curvature, ROM normal, no CVA tenderness    Lungs:      Respirations unlabored, good air movement. No wheezing, rales, ronchi   Chest Wall:      No tenderness or deformity     Heart:      Regular rate and rhythm, S1 and S2 normal, no murmur, rub or gallop    Breast Exam:      No chest wall tenderness, masses or gynecomastia     Abdomen:       Soft, non-tender, nondistended, normoactive bowel sounds, no masses, no hepatosplenomegaly    Genitalia:      Normal male external genitalia without lesions.  Testicles without masses.  No inguinal hernias.    Rectal:      Normal sphincter tone, no masses or tenderness; heme negative stool.  Prostate smooth, no nodules, not enlarged.    Extremities:     No clubbing, cyanosis or edema.  No rashes or suspicious lesions.  Pulses:     2+ and symmetric all extremities    Skin:     Skin color, texture, turgor normal.    Lymph nodes:     Cervical, supraclavicular, and inguinal nodes normal    Neurologic:     Normal strength, sensation and gait; reflexes 2+ and symmetric throughout                             Psych:   Normal mood, affect, hygiene and grooming   UPDATE SKIN Last year-- There is a macular lesion at his right forearm, hyperpigmented but also slightly pink. Has appt with derm for eval.  ASSESSMENT/PLAN:  Never returned for shingrix--want today?? Enter or give flu shot Enter/offer/decline COVID booster (prev declined).  Due for f/u with pulm, none scheduled.  Cbc, c-met, lipids, PSA TSH if sx  Discussed PSA screening (risks/benefits); recommended at least 30 minutes of aerobic activity at least 5 days/week, weight-bearing exercise at least 2x/week; proper sunscreen use reviewed; healthy diet and alcohol recommendations (less than or equal to 2 drinks/day) reviewed; regular seatbelt use; changing batteries in smoke detectors, carbon monoxide detectors ecommended. Immunization recommendations discussed--continue yearly flu shots. Shingrix recommended, risks/SE reviewed. COVID bivalent booster recommended, declined.  Colonoscopy recommendations reviewed--UTD   F/u 1 year for CPE, sooner prn. [place] for shingrix

## 2022-11-27 ENCOUNTER — Ambulatory Visit (INDEPENDENT_AMBULATORY_CARE_PROVIDER_SITE_OTHER): Payer: 59 | Admitting: Family Medicine

## 2022-11-27 ENCOUNTER — Encounter: Payer: Self-pay | Admitting: Family Medicine

## 2022-11-27 VITALS — BP 120/80 | HR 60 | Ht 71.0 in | Wt 213.0 lb

## 2022-11-27 DIAGNOSIS — M79675 Pain in left toe(s): Secondary | ICD-10-CM

## 2022-11-27 DIAGNOSIS — Z0000 Encounter for general adult medical examination without abnormal findings: Secondary | ICD-10-CM | POA: Diagnosis not present

## 2022-11-27 DIAGNOSIS — Z23 Encounter for immunization: Secondary | ICD-10-CM | POA: Diagnosis not present

## 2022-11-27 DIAGNOSIS — M79674 Pain in right toe(s): Secondary | ICD-10-CM | POA: Diagnosis not present

## 2022-11-27 DIAGNOSIS — N529 Male erectile dysfunction, unspecified: Secondary | ICD-10-CM

## 2022-11-27 DIAGNOSIS — Z125 Encounter for screening for malignant neoplasm of prostate: Secondary | ICD-10-CM

## 2022-11-27 DIAGNOSIS — J302 Other seasonal allergic rhinitis: Secondary | ICD-10-CM

## 2022-11-27 DIAGNOSIS — R635 Abnormal weight gain: Secondary | ICD-10-CM

## 2022-11-27 DIAGNOSIS — J4550 Severe persistent asthma, uncomplicated: Secondary | ICD-10-CM

## 2022-11-27 DIAGNOSIS — Z6829 Body mass index (BMI) 29.0-29.9, adult: Secondary | ICD-10-CM

## 2022-11-27 LAB — POCT URINALYSIS DIP (PROADVANTAGE DEVICE)
Bilirubin, UA: NEGATIVE
Blood, UA: NEGATIVE
Glucose, UA: NEGATIVE mg/dL
Ketones, POC UA: NEGATIVE mg/dL
Leukocytes, UA: NEGATIVE
Nitrite, UA: NEGATIVE
Protein Ur, POC: NEGATIVE mg/dL
Specific Gravity, Urine: 1.015
Urobilinogen, Ur: 0.2
pH, UA: 7 (ref 5.0–8.0)

## 2022-11-27 MED ORDER — MONTELUKAST SODIUM 10 MG PO TABS: 10.0000 mg | ORAL_TABLET | Freq: Every day | ORAL | 3 refills | Status: DC

## 2022-11-27 MED ORDER — SILDENAFIL CITRATE 100 MG PO TABS: 50.0000 mg | ORAL_TABLET | Freq: Every day | ORAL | 11 refills | Status: DC | PRN

## 2022-11-28 LAB — CBC WITH DIFFERENTIAL/PLATELET
Basophils Absolute: 0.1 10*3/uL (ref 0.0–0.2)
Basos: 2 %
EOS (ABSOLUTE): 0.4 10*3/uL (ref 0.0–0.4)
Eos: 10 %
Hematocrit: 46.1 % (ref 37.5–51.0)
Hemoglobin: 15.9 g/dL (ref 13.0–17.7)
Immature Grans (Abs): 0 10*3/uL (ref 0.0–0.1)
Immature Granulocytes: 0 %
Lymphocytes Absolute: 1.7 10*3/uL (ref 0.7–3.1)
Lymphs: 37 %
MCH: 30.2 pg (ref 26.6–33.0)
MCHC: 34.5 g/dL (ref 31.5–35.7)
MCV: 88 fL (ref 79–97)
Monocytes Absolute: 0.6 10*3/uL (ref 0.1–0.9)
Monocytes: 14 %
Neutrophils Absolute: 1.8 10*3/uL (ref 1.4–7.0)
Neutrophils: 37 %
Platelets: 280 10*3/uL (ref 150–450)
RBC: 5.26 x10E6/uL (ref 4.14–5.80)
RDW: 11.9 % (ref 11.6–15.4)
WBC: 4.6 10*3/uL (ref 3.4–10.8)

## 2022-11-28 LAB — PSA: Prostate Specific Ag, Serum: 1.5 ng/mL (ref 0.0–4.0)

## 2022-11-28 LAB — COMPREHENSIVE METABOLIC PANEL
ALT: 34 IU/L (ref 0–44)
AST: 29 IU/L (ref 0–40)
Albumin/Globulin Ratio: 1.9 (ref 1.2–2.2)
Albumin: 4.7 g/dL (ref 3.8–4.9)
Alkaline Phosphatase: 53 IU/L (ref 44–121)
BUN/Creatinine Ratio: 11 (ref 9–20)
BUN: 11 mg/dL (ref 6–24)
Bilirubin Total: 1 mg/dL (ref 0.0–1.2)
CO2: 24 mmol/L (ref 20–29)
Calcium: 9.6 mg/dL (ref 8.7–10.2)
Chloride: 100 mmol/L (ref 96–106)
Creatinine, Ser: 1.04 mg/dL (ref 0.76–1.27)
Globulin, Total: 2.5 g/dL (ref 1.5–4.5)
Glucose: 104 mg/dL — ABNORMAL HIGH (ref 70–99)
Potassium: 4.4 mmol/L (ref 3.5–5.2)
Sodium: 139 mmol/L (ref 134–144)
Total Protein: 7.2 g/dL (ref 6.0–8.5)
eGFR: 86 mL/min/{1.73_m2} (ref >59)

## 2022-11-28 LAB — LIPID PANEL
Chol/HDL Ratio: 2.3 ratio (ref 0.0–5.0)
Cholesterol, Total: 255 mg/dL — ABNORMAL HIGH (ref 100–199)
HDL: 112 mg/dL (ref >39)
LDL Chol Calc (NIH): 131 mg/dL — ABNORMAL HIGH (ref 0–99)
Triglycerides: 72 mg/dL (ref 0–149)
VLDL Cholesterol Cal: 12 mg/dL (ref 5–40)

## 2022-11-28 LAB — TSH: TSH: 0.916 u[IU]/mL (ref 0.450–4.500)

## 2022-11-28 LAB — URIC ACID: Uric Acid: 6.6 mg/dL (ref 3.8–8.4)

## 2023-01-15 ENCOUNTER — Ambulatory Visit: Payer: 59 | Admitting: Dermatology

## 2023-01-16 ENCOUNTER — Other Ambulatory Visit: Payer: Self-pay | Admitting: Internal Medicine

## 2023-03-21 ENCOUNTER — Other Ambulatory Visit: Payer: Self-pay | Admitting: Family Medicine

## 2023-03-21 MED ORDER — SCOPOLAMINE 1 MG/3DAYS TD PT72: 1.0000 | MEDICATED_PATCH | TRANSDERMAL | 0 refills | Status: DC

## 2023-03-21 NOTE — Telephone Encounter (Signed)
Pt called and is requesting motion sickness patches stating that they are going on a cruise June the 1st and would like them Pt uses  [id number] - [place], [place] - [address] AT [address] OF [facility] [address]

## 2023-03-21 NOTE — Telephone Encounter (Signed)
Seth Kerr stated it is an 8 day cruise. He would like rx for him and one for [facility]. I sent.

## 2023-03-21 NOTE — Telephone Encounter (Signed)
Find out how long the cruise is for.  It is one patch every 3 days.  They come in boxes of 4.  If cruise is a week, a box of 4 will cover both of them.  If they want separate rx's, okay to do same rx for [facility]

## 2023-04-21 ENCOUNTER — Other Ambulatory Visit: Payer: Self-pay | Admitting: Internal Medicine

## 2023-04-21 DIAGNOSIS — J4550 Severe persistent asthma, uncomplicated: Secondary | ICD-10-CM

## 2023-04-24 NOTE — Telephone Encounter (Signed)
Pt needs OV to get any more refills

## 2023-06-29 ENCOUNTER — Other Ambulatory Visit: Payer: Self-pay | Admitting: Family Medicine

## 2023-06-29 NOTE — Telephone Encounter (Signed)
Last apt 11/27/22 next apt 12/05/23.

## 2023-06-29 NOTE — Telephone Encounter (Signed)
This was filled in May for him and his daughter Seth Kerr. Not sure if he used those, and is going on another trip. If he used the others (should have 1-2 patches leftover), and has another trip, okay to refill.  But check on the length of the trip.  Patch is every 3 days.  If a long trip, may need larger quantity.

## 2023-08-21 ENCOUNTER — Telehealth: Payer: Self-pay | Admitting: Internal Medicine

## 2023-08-21 DIAGNOSIS — J4550 Severe persistent asthma, uncomplicated: Secondary | ICD-10-CM

## 2023-08-21 NOTE — Telephone Encounter (Signed)
Recevied refill request for Dupixent from pharmacy. Pt last seen 05/31/2022. Refill DENIED.  Patient was due in January for f/u appt. Will need appt for further refilsl to be sent  Chesley Mires, PharmD, MPH, BCPS, CPP Clinical Pharmacist (Rheumatology and Pulmonology)

## 2023-08-23 MED ORDER — DUPIXENT 300 MG/2ML ~~LOC~~ SOAJ
300.0000 mg | SUBCUTANEOUS | 1 refills | Status: DC
Start: 2023-08-23 — End: 2023-10-24

## 2023-08-23 NOTE — Telephone Encounter (Signed)
aPPT MADE

## 2023-08-23 NOTE — Addendum Note (Signed)
Addended by: Murrell Redden on: 08/23/2023 02:47 PM   Modules accepted: Orders

## 2023-09-05 ENCOUNTER — Telehealth: Payer: Self-pay | Admitting: Pharmacist

## 2023-09-05 NOTE — Telephone Encounter (Signed)
Submitted a Prior Authorization renewal request to [employer] for DUPIXENT via CoverMyMeds. Will update once we receive a response.  Key: Seth Kerr

## 2023-09-05 NOTE — Telephone Encounter (Signed)
Received notification from [employer] regarding a prior authorization for DUPIXENT. Authorization has been APPROVED from 09/05/23 to 09/04/2024.   Patient can continue to fill through [facility]: [phone]   Authorization # [id number]  Chesley Mires, PharmD, MPH, BCPS, CPP Clinical Pharmacist (Rheumatology and Pulmonology)

## 2023-09-07 ENCOUNTER — Other Ambulatory Visit (INDEPENDENT_AMBULATORY_CARE_PROVIDER_SITE_OTHER): Payer: 59

## 2023-09-07 DIAGNOSIS — Z23 Encounter for immunization: Secondary | ICD-10-CM | POA: Diagnosis not present

## 2023-10-24 ENCOUNTER — Encounter: Payer: Self-pay | Admitting: Internal Medicine

## 2023-10-24 ENCOUNTER — Ambulatory Visit: Payer: 59 | Admitting: Internal Medicine

## 2023-10-24 VITALS — BP 122/76 | HR 59 | Ht 71.0 in | Wt 185.0 lb

## 2023-10-24 DIAGNOSIS — J4550 Severe persistent asthma, uncomplicated: Secondary | ICD-10-CM

## 2023-10-24 DIAGNOSIS — J302 Other seasonal allergic rhinitis: Secondary | ICD-10-CM

## 2023-10-24 DIAGNOSIS — D7219 Other eosinophilia: Secondary | ICD-10-CM

## 2023-10-24 MED ORDER — DULERA 200-5 MCG/ACT IN AERO: 2.0000 | INHALATION_SPRAY | Freq: Two times a day (BID) | RESPIRATORY_TRACT | 11 refills | Status: DC

## 2023-10-24 MED ORDER — MONTELUKAST SODIUM 10 MG PO TABS
10.0000 mg | ORAL_TABLET | Freq: Every day | ORAL | 3 refills | Status: DC
Start: 2023-10-24 — End: 2024-01-27

## 2023-10-24 NOTE — Patient Instructions (Signed)
It was a pleasure to see you today!  Please schedule follow up scheduled with myself in 6 months.  If my schedule is not open yet, we will contact you with a reminder closer to that time. Please call [phone] if you haven't heard from [place] a month before, and always call us sooner if issues or concerns arise. You can also send [place] a message through MyChart, but but aware that this is not to be used for urgent issues and it may take up to 5-7 days to receive a reply. Please be aware that you will likely be able to view your results before I have a chance to respond to them. Please give [place] 5 business days to respond to any non-urgent results.    OK to come off dupixent and see how you do.  Take your singulair and allegra daily in allergy season.    Start Dulera 200 2 puffs twice daily as needed for your asthma. This is instead of the advair. Because you do not have daily symptoms, using this inhaler on an as needed basis may be sufficient.  I recommend starting to use the inhaler daily as prescribed if you have symptoms of asthma such as chest tightness, wheezing, coughing, shortness of breath. You should also start using it if you have exposure to a sick contact, worsening allergies, or any other trigger for your asthma. I recommend you keep using it even after your respiratory symptoms resolve for 3-4 days. The goal of this therapy is to prevent your symptoms from becoming a flare severe enough to require steroids like prednisone.   Please call our office if using this inhaler on an as needed basis is not sufficient. You might need to be seen sooner than our scheduled follow up.

## 2023-10-24 NOTE — Progress Notes (Signed)
Seth Kerr    [id number]    October 17, 1971  Primary Care Physician:Knapp, Clarene Critchley, MD Date of Appointment: 10/24/2023 Established Patient Visit  Chief complaint:   Chief Complaint  Patient presents with   Follow-up    Follow up , no concerns      HPI: Seth Kerr is a 52 y.o. man with severe persistent asthma on dupixent started Fall 2021.   Interval Updates: Here for follow up for asthma.  Two months ago required prednisone for seasonal allergies related flare. At that point was not taking advair and albuterol, singulair or allegra.   He has also switched careers and works from home - he now spends his time at his beach house in [place] and his allergies are better.   Interested in coming off dupixent.    Current Regimen: advair HFA 230 1 puff twice a day, albuterol prn, minimal use.  Asthma Triggers: environmental allergies Exacerbations in the last year: one in the last 12 months October 2024 History of hospitalization or intubation: Allergy Testing: has had SCIT in the past with Dr. Stanton Callas.  GERD: denies Allergic Rhinitis: yes, seasonal allergies. Takes allegra, montelukast.  ACT:  Asthma Control Test ACT Total Score  05/31/2022  3:22 PM 20  09/21/2021  3:16 PM 22  08/10/2021  2:06 PM 12   FeNO: 33 ppb in September 2022, 7 ppb today   I have reviewed the patient's family social and past medical history and updated as appropriate.   Past Medical History:  Diagnosis Date   Allergic rhinitis, cause unspecified    immunotherapy x 44yrs (stopped end of 2015), per Dr. Ch[place]au Callas   Asthma    COVID-19 virus infection 09/29/2019   Erectile dysfunction    Irritable bowel syndrome (IBS)    has had 2 colonoscopies   Melanoma (HCC) 01/11/2022   right forearm posterior   Onychomycosis    s/p lamisil x 2 courses    Past Surgical History:  Procedure Laterality Date   COLONOSCOPY  last 2012   in [place] Normal exam per pt   SEPTOPLASTY  approx age 78   SHOULDER  SURGERY Right    TONSILLECTOMY  age 47   VASECTOMY      Family History  Problem Relation Age of Onset   Cancer Mother        lung cancer, smoker   Alcohol abuse Mother    Cancer Father        stomach cancer, smoker   Alcohol abuse Father    Stomach cancer Father    Asthma Daughter    Diabetes Neg Hx    Heart disease Neg Hx    Esophageal cancer Neg Hx    Rectal cancer Neg Hx    Colon cancer Neg Hx    Colon polyps Neg Hx     Social History   Occupational History   Occupation: Corporate treasurer at [employer]    Employer: [employer]  Tobacco Use   Smoking status: Never   Smokeless tobacco: Never  Vaping Use   Vaping status: Never Used  Substance and Sexual Activity   Alcohol use: Yes    Comment: 2 glasses of wine (or vodka soda) 7x/week; last month cut back to 5d/wk   Drug use: No   Sexual activity: Yes    Partners: Female     Physical Exam: Blood pressure 122/76, pulse (!) 59, height 5\' 11"  (1.803 m), weight 185 lb (83.9 kg), SpO2  100%.  Gen:      No distress Lungs:    ctab no wheeze CV:        RRR no mrg   Data Reviewed: Imaging: I have personally reviewed the chest xray 07/2021 no acute process, mild peribronchial thickening.  PFTs:     Latest Ref Rng & Units 09/21/2021    2:01 PM 06/15/2016   12:49 PM  PFT Results  FVC-Pre L 4.27  4.26   FVC-Predicted Pre % 84  82   FVC-Post L 4.43  4.54   FVC-Predicted Post % 88  88   Pre FEV1/FVC % % 78  80   Post FEV1/FCV % % 82  83   FEV1-Pre L 3.34  3.42   FEV1-Predicted Pre % 85  84   FEV1-Post L 3.62  3.76   DLCO uncorrected ml/min/mmHg 36.28  41.88   DLCO UNC% % 124  129   DLCO corrected ml/min/mmHg 36.28    DLCO COR %Predicted % 124    DLVA Predicted % 133  153   TLC L 6.02  6.27   TLC % Predicted % 86  90   RV % Predicted % 82  96    I have personally reviewed the patient's PFTs and today showed normal pulmonary function.  Labs: IgE 7 in September 2022 Elevated  eosinophils in the past 1254   Immunization status: Immunization History  Administered Date(s) Administered   Influenza Split 09/12/2012, 08/29/2014, 09/05/2016   Influenza, Seasonal, Injecte, Preservative Fre 09/07/2023   Influenza,inj,Quad PF,6+ Mos 10/09/2013, 09/03/2015, 08/29/2017, 08/28/2018, 08/25/2019, 08/18/2020, 09/21/2021, 11/27/2022   PFIZER Comirnaty(Gray Top)Covid-19 Tri-Sucrose Vaccine 05/12/2021   PFIZER(Purple Top)SARS-COV-2 Vaccination 01/21/2020, 02/11/2020   Pneumococcal Polysaccharide-23 05/05/2015   Tdap 10/04/2010, 04/22/2014    Assessment:  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   Take your singulair and allegra daily in allergy season.    Start Dulera 200 2 puffs twice daily as needed for your asthma. This is instead of the advair. Because you do not have daily symptoms, using this inhaler on an as needed basis may be sufficient.   I spent 30 minutes in the care of this patient today including pre-charting, chart review, review of results, face-to-face care, coordination of care and communication with consultants etc.).   Return to Care: Return in about 6 months (around 04/23/2024).   Durel Salts, MD Pulmonary and Critical Care Medicine [facility] [phone]

## 2023-10-25 NOTE — Telephone Encounter (Signed)
Per OV note from yesterday, joint decision made between Dr. Celine Mans and patient to come off of Dupixent and re-assess asthma status in about 6 months. NFN  Chesley Mires, PharmD, MPH, BCPS, CPP Clinical Pharmacist (Rheumatology and Pulmonology)

## 2023-11-11 ENCOUNTER — Other Ambulatory Visit: Payer: Self-pay | Admitting: Internal Medicine

## 2023-12-04 NOTE — Progress Notes (Signed)
 Chief Complaint  Patient presents with   Annual Exam    Fasting annual exam, no new concerns. Will take shingrix  and Tdap. Started taking compounded semaglutide through [facility] April 2024.    Seth Kerr is a 53 y.o. male who presents for a complete physical.    He started semaglutide through [facility] (compounding pharmacy) in April 2024.  He took a break in October when in [place], but resumed it upon returning. Weight was >200# prior to starting. He was down to 173# in August.  Has been staying around 185# recently.  It helps decrease appetite, smaller portions.  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  He last saw Dr. Dione Franks in December. They are doing a trial coming off the dupixent , and using dulera  200mg  BID prn. He rarely needs to use this, nor does he need to take any albuterol .  He remains very active with running, biking, and hasn't had any problems related to exercise.   Allergies: He no longer sees Dr. Almeda Jacobs, no longer getting immunotherapy. He takes montelukast , Allegra and nasal spray  as needed, seasonally. He spends a lot more time at [facility], and allergies are better there.  ED comes and goes. Has used Viagra  in the past with good results.   Last year he noted some achey pain in his big toes off and on, more frequently on the left.  There was no associated redness, swelling, pain was mild. Not associated certain shoes or activities, sometimes notices the discomfort in the morning. Denies stiffness. Uric acid level was checked, and was 6.6 in 11/2022. He no longer has this discomfort.   Immunization History  Administered Date(s) Administered   Influenza Split 09/12/2012, 08/29/2014, 09/05/2016   Influenza, Seasonal, Injecte, Preservative Fre 09/07/2023   Influenza,inj,Quad PF,6+ Mos 10/09/2013, 09/03/2015, 08/29/2017, 08/28/2018, 08/25/2019, 08/18/2020, 09/21/2021, 11/27/2022   PFIZER Comirnaty(Gray Top)Covid-19  Tri-Sucrose Vaccine 05/12/2021   PFIZER(Purple Top)SARS-COV-2 Vaccination 01/21/2020, 02/11/2020   Pneumococcal Polysaccharide-23 05/05/2015   Tdap 10/04/2010, 04/22/2014  Declined bivalent COVID booster Last colonoscopy: 01/2021 with Dr. Savannah Curlin, normal. Last PSA:  Lab Results  Component Value Date   PSA1 1.5 11/27/2022   PSA1 1.3 11/24/2021   PSA1 1.1 11/11/2020  Dentist: twice yearly Optho: every 2 years, wears glasses. Due.  Notices worsening vision (reading only) Exercise: Cardio 5 days/week (running (mostly), biking (at the gym) and swimming when training for triathlons, not recently). Weights 5x/week (joined [employer] on [employer] Goal is to compete in a full ironman (lost motivation for the one he had planned to do last fall)  Lipids: Lab Results  Component Value Date   CHOL 255 (H) 11/27/2022   HDL 112 11/27/2022   LDLCALC 131 (H) 11/27/2022   TRIG 72 11/27/2022   CHOLHDL 2.3 11/27/2022  Denies changes in diet, other than eating less due to being on semaglutide.   Vitamin D  normal at 45 in 2015 Normal thyroid screen 2015  CBC normal in 11/2022 (had shown eosinophilia in 2022) Lab Results  Component Value Date   WBC 4.6 11/27/2022   HGB 15.9 11/27/2022   HCT 46.1 11/27/2022   MCV 88 11/27/2022   PLT 280 11/27/2022    PMH, PSH, SH and FH were reviewed and updated  Outpatient Encounter Medications as of 12/05/2023  Medication Sig Note   Multiple Vitamins-Minerals (MULTIVITAMIN MEN PO) Take 1 capsule by mouth daily.    Semaglutide,0.25 or 0.5MG /DOS, 2 MG/1.5ML SOPN Inject 0.5 mg into the  skin once a week. 12/05/2023: Started in August(takes on Mondays) compounded from [facility].    albuterol  (PROVENTIL ) (2.5 MG/3ML) 0.083% nebulizer solution INHALE 3 ML BY NEBULIZATION EVERY 6 HOURS AS NEEDED FOR WHEEZING OR SHORTNESS OF BREATH (Patient not taking: Reported on 12/05/2023) 12/05/2023: As needed   albuterol  (VENTOLIN  HFA) 108 (90 Base) MCG/ACT inhaler INHALE 2 PUFFS  INTO THE LUNGS EVERY 6 HOURS AS NEEDED FOR WHEEZING/SHORTNESS OF BREATH (Patient not taking: Reported on 12/05/2023) 12/05/2023: As needed   fexofenadine (ALLEGRA) 180 MG tablet Take 180 mg by mouth every other day. (Patient not taking: Reported on 12/05/2023) 12/05/2023: Stopped daily use one year ago, may use prn in Spring   mometasone -formoterol  (DULERA ) 200-5 MCG/ACT AERO Inhale 2 puffs into the lungs in the morning and at bedtime. (Patient not taking: Reported on 12/05/2023) 12/05/2023: Stopped daily use a year ago, uses prn   montelukast  (SINGULAIR ) 10 MG tablet Take 1 tablet (10 mg total) by mouth at bedtime. (Patient not taking: Reported on 12/05/2023) 12/05/2023: Stopped last year, but has on hand for prn use   sildenafil  (VIAGRA ) 100 MG tablet Take 0.5-1 tablets (50-100 mg total) by mouth daily as needed for erectile dysfunction. 12/05/2023: Uses prn (full tablet)   [DISCONTINUED] sildenafil  (VIAGRA ) 100 MG tablet Take 0.5-1 tablets (50-100 mg total) by mouth daily as needed for erectile dysfunction. (Patient not taking: Reported on 12/05/2023) 12/05/2023: As needed   No facility-administered encounter medications on file as of 12/05/2023.   Allergies  Allergen Reactions   Aspirin Other (See Comments)    Not actual allergy , had chronic nosebleeds as a child and was instructed not to use aspirin.     ROS:  The patient denies anorexia, headaches, vision loss, decreased hearing, ear pain, hoarseness, palpitations, dizziness, syncope, dyspnea on exertion, cough, swelling, nausea, vomiting, diarrhea, constipation, abdominal pain, melena, hematochezia, indigestion/heartburn, hematuria, incontinence, numbness, tingling, weakness, tremor, suspicious skin lesions, depression, anxiety, abnormal bleeding/bruising, or enlarged lymph nodes. Some urinary urgency, intermittent.  Only rarely has issues completely emptying bladder. ED intermittently, meds are effective. Normal libido. Asthma/allergies are  controlled. Weight loss d/t semaglutide, per HPI Sore back currently, mild   PHYSICAL EXAM:  BP 128/70   Pulse 68   Ht 5\' 10"  (1.778 m)   Wt 190 lb (86.2 kg)   BMI 27.26 kg/m   Wt Readings from Last 3 Encounters:  12/05/23 190 lb (86.2 kg)  10/24/23 185 lb (83.9 kg)  11/27/22 213 lb (96.6 kg)  11/24/21  209# 3.2 oz 11/11/20 194# 12.8 oz  General Appearance:    Alert, cooperative, no distress, appears stated age    Head:      Normocephalic, without obvious abnormality, atraumatic    Eyes:      PERRL, conjunctiva/corneas clear, EOM's intact, fundi benign    Ears:      Normal TM's and external ear canals    Nose:     No drainage or sinus tenderness  Throat:     Normal mucosa  Neck:     Supple, no lymphadenopathy;  thyroid:  no enlargement/tenderness/ nodules; no carotid bruit or JVD    Back:      Spine nontender, no curvature, ROM normal, no CVA tenderness    Lungs:      Respirations unlabored, good air movement. No wheezing, rales, ronchi   Chest Wall:      No tenderness or deformity     Heart:      Regular rate and rhythm, S1 and S2  normal, no murmur, rub or gallop    Breast Exam:      No chest wall tenderness, masses or gynecomastia    Abdomen:       Soft, non-tender, nondistended, normoactive bowel sounds, no masses, no hepatosplenomegaly    Genitalia:      Normal male external genitalia without lesions.  Testicles without masses.  No inguinal hernias.    Rectal:      Normal sphincter tone, no masses or tenderness; heme negative stool.  Prostate smooth, no nodules, not enlarged.    Extremities:     No clubbing, cyanosis or edema.  No rashes or suspicious lesions.  Pulses:     2+ and symmetric all extremities    Skin:     Skin color, texture, turgor normal.    Lymph nodes:     Cervical, supraclavicular, and inguinal nodes normal    Neurologic:     Normal strength, sensation and gait; reflexes 2+ and symmetric throughout                             Psych:   Normal mood, affect,  hygiene and grooming    ASSESSMENT/PLAN:  Annual physical exam - Plan: Lipid panel, PSA, CMP14+EGFR, CBC with Differential/Platelet  Seasonal allergies - not currently flaring. Has med regimen for when needed. Advised that montelukast  needs to be taken daily when allergies flare (vs prn sporadically)  Severe persistent asthma without complication - this has gotten significantly better--currently on trial off Dupixent , and hasn't even needed any inhaler. Cont to f/u with pulmonary  Screening for prostate cancer - Plan: PSA  Erectile dysfunction, unspecified erectile dysfunction type - Plan: sildenafil  (VIAGRA ) 100 MG tablet  Hypercholesterolemia - excellent HDL.  Eating less due to semaglutide, suspect LDL will be better. Overall is fine. Wants recheck - Plan: Lipid panel  Personal history of malignant melanoma of skin - past due for skin check. Dr. Steen Eden retired - Plan: Ambulatory referral to Dermatology  Eosinophilia, unspecified type - recheck CBC now that he is off duplixent - Plan: CBC with Differential/Platelet  Overweight - losing weight with compounded semaglutide. Discussed how to taper, and potential concerns with compounded form. Healthy diet reviewed  Need for shingles vaccine - Plan: Zoster Recombinant (Shingrix  )  Need for Tdap vaccination - Plan: Tdap vaccine greater than or equal to 7yo IM  Discussed tapering semaglutide rather than stopping abruptly when at goal (lengthen time between shots lower dose); counseled re: decreasing alcohol (reviewed risks, in addition to contribution to weight gain)  Discussed PSA screening (risks/benefits); recommended at least 30 minutes of aerobic activity at least 5 days/week, weight-bearing exercise at least 2x/week; proper sunscreen use reviewed; healthy diet and alcohol recommendations (less than or equal to 2 drinks/day) reviewed; regular seatbelt use; changing batteries in smoke detectors, carbon monoxide detectors ecommended.  Immunization recommendations discussed--continue yearly flu shots.  TdaP and Shingrix  were given today. Consider pneumovax booster next year. COVID bivalent booster recommended, declined.   Colonoscopy recommendations reviewed--UTD   F/u 1 year for CPE, sooner prn.

## 2023-12-04 NOTE — Patient Instructions (Signed)

## 2023-12-05 ENCOUNTER — Encounter: Payer: Self-pay | Admitting: Family Medicine

## 2023-12-05 ENCOUNTER — Ambulatory Visit: Payer: 59 | Admitting: Family Medicine

## 2023-12-05 VITALS — BP 128/70 | HR 68 | Ht 70.0 in | Wt 190.0 lb

## 2023-12-05 DIAGNOSIS — Z85820 Personal history of malignant melanoma of skin: Secondary | ICD-10-CM

## 2023-12-05 DIAGNOSIS — J4550 Severe persistent asthma, uncomplicated: Secondary | ICD-10-CM | POA: Diagnosis not present

## 2023-12-05 DIAGNOSIS — J302 Other seasonal allergic rhinitis: Secondary | ICD-10-CM

## 2023-12-05 DIAGNOSIS — Z23 Encounter for immunization: Secondary | ICD-10-CM

## 2023-12-05 DIAGNOSIS — E7800 Pure hypercholesterolemia, unspecified: Secondary | ICD-10-CM

## 2023-12-05 DIAGNOSIS — D721 Eosinophilia, unspecified: Secondary | ICD-10-CM

## 2023-12-05 DIAGNOSIS — Z125 Encounter for screening for malignant neoplasm of prostate: Secondary | ICD-10-CM | POA: Diagnosis not present

## 2023-12-05 DIAGNOSIS — Z0000 Encounter for general adult medical examination without abnormal findings: Secondary | ICD-10-CM | POA: Diagnosis not present

## 2023-12-05 DIAGNOSIS — E663 Overweight: Secondary | ICD-10-CM

## 2023-12-05 DIAGNOSIS — N529 Male erectile dysfunction, unspecified: Secondary | ICD-10-CM

## 2023-12-05 MED ORDER — SILDENAFIL CITRATE 100 MG PO TABS: 50.0000 mg | ORAL_TABLET | Freq: Every day | ORAL | 11 refills | Status: AC | PRN

## 2023-12-06 ENCOUNTER — Encounter: Payer: Self-pay | Admitting: Family Medicine

## 2023-12-06 LAB — CMP14+EGFR
ALT: 36 [IU]/L (ref 0–44)
AST: 29 [IU]/L (ref 0–40)
Albumin: 4.5 g/dL (ref 3.8–4.9)
Alkaline Phosphatase: 50 [IU]/L (ref 44–121)
BUN/Creatinine Ratio: 12 (ref 9–20)
BUN: 11 mg/dL (ref 6–24)
Bilirubin Total: 0.7 mg/dL (ref 0.0–1.2)
CO2: 23 mmol/L (ref 20–29)
Calcium: 9.5 mg/dL (ref 8.7–10.2)
Chloride: 103 mmol/L (ref 96–106)
Creatinine, Ser: 0.89 mg/dL (ref 0.76–1.27)
Globulin, Total: 2.3 g/dL (ref 1.5–4.5)
Glucose: 93 mg/dL (ref 70–99)
Potassium: 4.7 mmol/L (ref 3.5–5.2)
Sodium: 142 mmol/L (ref 134–144)
Total Protein: 6.8 g/dL (ref 6.0–8.5)
eGFR: 102 mL/min/{1.73_m2} (ref >59)

## 2023-12-06 LAB — CBC WITH DIFFERENTIAL/PLATELET
Basophils Absolute: 0.1 10*3/uL (ref 0.0–0.2)
Basos: 2 %
EOS (ABSOLUTE): 1.4 10*3/uL — ABNORMAL HIGH (ref 0.0–0.4)
Eos: 21 %
Hematocrit: 44.2 % (ref 37.5–51.0)
Hemoglobin: 15.3 g/dL (ref 13.0–17.7)
Immature Grans (Abs): 0 10*3/uL (ref 0.0–0.1)
Immature Granulocytes: 0 %
Lymphocytes Absolute: 2.3 10*3/uL (ref 0.7–3.1)
Lymphs: 34 %
MCH: 31 pg (ref 26.6–33.0)
MCHC: 34.6 g/dL (ref 31.5–35.7)
MCV: 90 fL (ref 79–97)
Monocytes Absolute: 0.7 10*3/uL (ref 0.1–0.9)
Monocytes: 11 %
Neutrophils Absolute: 2.2 10*3/uL (ref 1.4–7.0)
Neutrophils: 32 %
Platelets: 247 10*3/uL (ref 150–450)
RBC: 4.94 x10E6/uL (ref 4.14–5.80)
RDW: 11.8 % (ref 11.6–15.4)
WBC: 6.7 10*3/uL (ref 3.4–10.8)

## 2023-12-06 LAB — LIPID PANEL
Chol/HDL Ratio: 2.1 {ratio} (ref 0.0–5.0)
Cholesterol, Total: 224 mg/dL — ABNORMAL HIGH (ref 100–199)
HDL: 109 mg/dL (ref >39)
LDL Chol Calc (NIH): 104 mg/dL — ABNORMAL HIGH (ref 0–99)
Triglycerides: 61 mg/dL (ref 0–149)
VLDL Cholesterol Cal: 11 mg/dL (ref 5–40)

## 2023-12-06 LAB — PSA: Prostate Specific Ag, Serum: 1.4 ng/mL (ref 0.0–4.0)

## 2024-01-06 ENCOUNTER — Other Ambulatory Visit: Payer: Self-pay | Admitting: Internal Medicine

## 2024-01-09 ENCOUNTER — Other Ambulatory Visit: Payer: Self-pay | Admitting: Internal Medicine

## 2024-01-10 ENCOUNTER — Telehealth: Payer: Self-pay

## 2024-01-10 ENCOUNTER — Other Ambulatory Visit (HOSPITAL_COMMUNITY): Payer: Self-pay

## 2024-01-10 NOTE — Telephone Encounter (Signed)
Pharmacy Patient Advocate Encounter   Received notification from RX Request Messages that prior authorization for [facility] 200-5 MCG/ACT Aero  is required/requested.   Insurance verification completed.   The patient is insured through [facility] ADVANTAGE/RX ADVANCE .   Per test claim:  Budesonide-Formoterol, Fluticasone-Salmeterol, Jerral Ralph, Wixela Inhub, Earlie Server** is preferred by the insurance.  If suggested medication is appropriate, Please send in a new RX and discontinue this one. If not, please advise as to why it's not appropriate so that we may request a Prior Authorization. Please note, some preferred medications may still require a PA.  If the suggested medications have not been trialed and there are no contraindications to their use, the PA will not be submitted, as it will not be approved.   **Co-pays are as follows:  Budesonide-Formoterol  $12.00 Fluticasone-Salmeterol Diskus   $12.00 Breyna   $12.00 Wixela Inhub   $12.00 Breo Ellipta   $64.00

## 2024-01-10 NOTE — Telephone Encounter (Signed)
PA request has been  paused . New Encounter created for follow up. For additional info see Pharmacy Prior Auth telephone encounter from 01-10-2024.

## 2024-01-11 MED ORDER — BUDESONIDE-FORMOTEROL FUMARATE 160-4.5 MCG/ACT IN AERO: 2.0000 | INHALATION_SPRAY | Freq: Two times a day (BID) | RESPIRATORY_TRACT | 11 refills | Status: DC

## 2024-01-11 NOTE — Telephone Encounter (Signed)
Budesonide-formterol sent to pharmacy

## 2024-01-27 ENCOUNTER — Other Ambulatory Visit: Payer: Self-pay | Admitting: Internal Medicine

## 2024-01-27 DIAGNOSIS — J302 Other seasonal allergic rhinitis: Secondary | ICD-10-CM

## 2024-01-27 DIAGNOSIS — J4550 Severe persistent asthma, uncomplicated: Secondary | ICD-10-CM

## 2024-02-18 ENCOUNTER — Other Ambulatory Visit: Payer: Self-pay | Admitting: Internal Medicine

## 2024-02-18 DIAGNOSIS — J4550 Severe persistent asthma, uncomplicated: Secondary | ICD-10-CM

## 2024-03-03 ENCOUNTER — Other Ambulatory Visit: Payer: 59

## 2024-03-03 DIAGNOSIS — Z23 Encounter for immunization: Secondary | ICD-10-CM

## 2024-03-31 ENCOUNTER — Other Ambulatory Visit: Payer: Self-pay | Admitting: Internal Medicine

## 2024-04-21 ENCOUNTER — Telehealth: Admitting: Family Medicine

## 2024-04-21 ENCOUNTER — Encounter: Payer: Self-pay | Admitting: Family Medicine

## 2024-04-21 VITALS — Ht 70.0 in | Wt 182.0 lb

## 2024-04-21 DIAGNOSIS — L259 Unspecified contact dermatitis, unspecified cause: Secondary | ICD-10-CM | POA: Diagnosis not present

## 2024-04-21 MED ORDER — PREDNISONE 10 MG (21) PO TBPK: ORAL_TABLET | ORAL | 0 refills | Status: DC

## 2024-04-21 NOTE — Progress Notes (Signed)
 Start time: 3:25 End time: 3:58  Virtual Visit via Video Note  I connected with Seth Kerr on 04/21/24 by a video enabled telemedicine application and verified that I am speaking with the correct person using two identifiers.  Technical issues--MD unable to access the video visit (pt was there). Used phone for audio. No video available. Pt had sent photos in advance.  Location: Patient: in [facility] (won't be back in [place] until the end of the week). Provider: office   I discussed the limitations of evaluation and management by telemedicine and the availability of in person appointments. The patient expressed understanding and agreed to proceed.  History of Present Illness:  Chief Complaint  Patient presents with   Rash    VIRTUAL rash on upper body that started 2 weeks ago w/ bumps under armpits. Very itchy and irritating. Switched laundry beads 2 weeks ago, rewashed all items since then. Using benadryl itch spray and cortisone roller ball.    Rash started under both underarms--some itching and bumps first noted about a week or two ago. It has spread over the upper part of his chest and sides down to the hips over the last week.  Changed fragrance of laundry beads (both Downy). He stopped using this right away, re-washed clothes/bedding. Today it is slightly better, but remains itchy, irritating. Denies any other changes in products, soaps or other exposures. Dogs sleep with them, possibly could have been exposed to something via dog.  He tried hydrocortisone 2% cream frequently for itching, as well as a different formulation of 1% HC. He also tried Benadryl itch cooling spray. These help temporarily.  Wakes up at 2-3 in the morning, takes a cold shower and uses the HC cream. Sun made it worse--more irritated  Took Allegra for hives (none in the last 2 days)--took on Fri and Saturday, and took regular Allegra yesterday (he looked at meds--both were 180mg  fexofenadine). Then switched to  Zyrtec today. Seemed better earlier, but starting to wear off and get itchy again.   Med changes--he no longer takes dupixent  (stopped in December). He took steroids about a month ago for wheezing and chest tightness. Inhaler switched to symbicort    PMH, PSH, SH reviewed  Outpatient Encounter Medications as of 04/21/2024  Medication Sig Note   budesonide -formoterol  (SYMBICORT ) 160-4.5 MCG/ACT inhaler Inhale 2 puffs into the lungs 2 (two) times daily.    montelukast  (SINGULAIR ) 10 MG tablet TAKE 1 TABLET BY MOUTH EVERYDAY AT BEDTIME    Multiple Vitamins-Minerals (MULTIVITAMIN MEN PO) Take 1 capsule by mouth daily.    Semaglutide,0.25 or 0.5MG /DOS, 2 MG/1.5ML SOPN Inject 0.5 mg into the skin once a week. 04/21/2024: Started in August(takes on Mondays) compounded from [facility].   albuterol  (PROVENTIL ) (2.5 MG/3ML) 0.083% nebulizer solution INHALE 3 ML BY NEBULIZATION EVERY 6 HOURS AS NEEDED FOR WHEEZING OR SHORTNESS OF BREATH (Patient not taking: Reported on 04/21/2024) 04/21/2024: As needed   albuterol  (VENTOLIN  HFA) 108 (90 Base) MCG/ACT inhaler INHALE 2 PUFFS INTO THE LUNGS EVERY 6 HOURS AS NEEDED FOR WHEEZING/SHORTNESS OF BREATH (Patient not taking: Reported on 04/21/2024) 04/21/2024: As needed   sildenafil  (VIAGRA ) 100 MG tablet Take 0.5-1 tablets (50-100 mg total) by mouth daily as needed for erectile dysfunction. (Patient not taking: Reported on 04/21/2024) 04/21/2024: As needed (full tablet)   [DISCONTINUED] fexofenadine (ALLEGRA) 180 MG tablet Take 180 mg by mouth every other day. (Patient not taking: Reported on 12/05/2023) 12/05/2023: Stopped daily use one year ago, may use prn in Spring  No facility-administered encounter medications on file as of 04/21/2024.   Allergies  Allergen Reactions   Aspirin Other (See Comments)    Not actual allergy , had chronic nosebleeds as a child and was instructed not to use aspirin.    ROS:  no HA, dizziness, CP, shortness of breath (breathing at  baseline). Having some cough, like a buildup in his throat, frequent throat-clearing. No swelling or trouble swallowing. No sore thoat, swollen glands or fever. No sick contacts    Observations/Objective:  Ht 5\' 10"  (1.778 m)   Wt 182 lb (82.6 kg)   BMI 26.11 kg/m   Wt Readings from Last 3 Encounters:  04/21/24 182 lb (82.6 kg)  12/05/23 190 lb (86.2 kg)  10/24/23 185 lb (83.9 kg)   See photos in Media. Papular rash extending from axilla down his sides and to chest. Papules were larger on the first 2 images, which were from the day prior, papules much smaller today. No hyperpigmentation. No visible STS  He is alert and oriented.  No throat-clearing or coughing during visit. Exam was limited due to the virtual nature of the visit.    Assessment and Plan:   Contact dermatitis, unspecified contact dermatitis type, unspecified trigger - Supportive measures reviewed. If not cont to improve with antihistamines and topical measures, start steroids. Ddx reviewed, consider Selsun Blue use also - Plan: predniSONE  (STERAPRED UNI-PAK 21 TAB) 10 MG (21) TBPK tablet  Risks/SE of steroids reviewed (pt well aware, recently had). Given that itching seems worse after shower, when hot/sweaty, consider tinea versicolor in differential, but not usually this acute. No harm in using selenium sulfide daily for a week (reviewed proper use), especially if not responding to other treatments or steroids.   Continue zyrtec once daily. Use a benadryl (diphenhydramine) at bedtime. You can use topical medications like calamine/caladryl and hydrocortisone creams up to 3 times/day. Try using cool compresses, or anti-itch creams that have some menthol/cooling agents.  Next steps would be a trial of prednisone  to treat a contact dermatitis (allergic reaction). There is a small possibility that this could be fungal.  This would respond to athlete's foot medications (lamisil  or lotrimin), or possibly lathering  up Selsun blue shampoo to affected areas, letting it sit there for 10 minutes before rinsing)  Use this daily for a week.  Follow-up later this week if not improving.   Follow Up Instructions:    I discussed the assessment and treatment plan with the patient. The patient was provided an opportunity to ask questions and all were answered. The patient agreed with the plan and demonstrated an understanding of the instructions.   The patient was advised to call back or seek an in-person evaluation if the symptoms worsen or if the condition fails to improve as anticipated.  I spent 30 minutes dedicated to the care of this patient, including pre-visit review of records, face to face time, post-visit ordering of testing and documentation.    Paulett Boros, MD

## 2024-04-21 NOTE — Patient Instructions (Signed)
 Continue zyrtec once daily. Use a benadryl (diphenhydramine) at bedtime. You can use topical medications like calamine/caladryl and hydrocortisone creams up to 3 times/day. Try using cool compresses, or anti-itch creams that have some menthol/cooling agents.  Next steps would be a trial of prednisone  to treat a contact dermatitis (allergic reaction). There is a small possibility that this could be fungal.  This would respond to athlete's foot medications (lamisil  or lotrimin), or possibly lathering up Selsun blue shampoo to affected areas, letting it sit there for 10 minutes before rinsing)  Use this daily for a week.  Follow-up later this week if not improving.

## 2024-04-22 ENCOUNTER — Encounter: Payer: Self-pay | Admitting: Family Medicine

## 2024-04-28 ENCOUNTER — Ambulatory Visit: Admitting: Family Medicine

## 2024-04-28 ENCOUNTER — Encounter: Payer: Self-pay | Admitting: Family Medicine

## 2024-04-28 VITALS — BP 120/70 | HR 68 | Temp 97.8°F | Ht 70.0 in | Wt 190.6 lb

## 2024-04-28 DIAGNOSIS — J302 Other seasonal allergic rhinitis: Secondary | ICD-10-CM | POA: Diagnosis not present

## 2024-04-28 DIAGNOSIS — R21 Rash and other nonspecific skin eruption: Secondary | ICD-10-CM | POA: Diagnosis not present

## 2024-04-28 NOTE — Progress Notes (Signed)
 Chief Complaint  Patient presents with   Rash    Rash is still there, not worsening. Somewhat better but has spead to lower back. Found out after telemedicine that dogs had fleas.    Rash is "moving around", maybe getting better.  Began 3 weeks ago at the armpits, and then started spreading down the flanks and chest. He was seen for virtual visit last week.   He was treated with zyrtec (switched from Allegra), prednisone  taper, calamine, and HC 1% cream. He also tried Selsun Blue for a week (every time he showered, usually 2x/day--the cold water felt good).  He did have some rash develop on the arms (never very significant), just feels slightly irritated at the antecubital fossa and rash fading along his sides and chest.  Takes benadryl prn at night, and needed it again at 4am last night (dogs woke him up, felt irritated). He finished the prednisone  2 days ago. Denies any worsening since stopping.  He mainly notices the rash on his back now. It is itchy and feels irritated.  He admits to scratching and rubbing (to the point where he sees bruises). Worse with sweating, hotter showers Sides/chest feels somewhat irritated, the bumps are much smaller. Minimally itchy.  He noted that he was coughing after his coffee.  This stopped when he eliminated the dairy creamer, switching to non-dairy. Has some ongoing throat-clearing, much less. Unsure of the prednisone  timing with respect to the cough and eliminating dairy.     PMH, PSH, SH reviewed  Outpatient Encounter Medications as of 04/28/2024  Medication Sig Note   budesonide -formoterol  (SYMBICORT ) 160-4.5 MCG/ACT inhaler Inhale 2 puffs into the lungs 2 (two) times daily.    calamine lotion Apply 1 Application topically as needed for itching. 04/28/2024: Used this am   cetirizine (ZYRTEC) 10 MG tablet Take 10 mg by mouth daily.    diphenhydrAMINE (BENADRYL) 25 MG tablet Take 25 mg by mouth every 6 (six) hours as needed. 04/28/2024: One last  night and one this am   montelukast  (SINGULAIR ) 10 MG tablet TAKE 1 TABLET BY MOUTH EVERYDAY AT BEDTIME    Multiple Vitamins-Minerals (MULTIVITAMIN MEN PO) Take 1 capsule by mouth daily.    albuterol  (PROVENTIL ) (2.5 MG/3ML) 0.083% nebulizer solution INHALE 3 ML BY NEBULIZATION EVERY 6 HOURS AS NEEDED FOR WHEEZING OR SHORTNESS OF BREATH (Patient not taking: Reported on 04/28/2024) 04/28/2024: As needed   albuterol  (VENTOLIN  HFA) 108 (90 Base) MCG/ACT inhaler INHALE 2 PUFFS INTO THE LUNGS EVERY 6 HOURS AS NEEDED FOR WHEEZING/SHORTNESS OF BREATH (Patient not taking: Reported on 04/28/2024) 04/28/2024: As needed   Semaglutide,0.25 or 0.5MG /DOS, 2 MG/1.5ML SOPN Inject 0.5 mg into the skin once a week. (Patient not taking: Reported on 04/28/2024) 04/28/2024: Has not done a few weeks   sildenafil  (VIAGRA ) 100 MG tablet Take 0.5-1 tablets (50-100 mg total) by mouth daily as needed for erectile dysfunction. (Patient not taking: Reported on 04/28/2024) 04/21/2024: As needed (full tablet)   [DISCONTINUED] predniSONE  (STERAPRED UNI-PAK 21 TAB) 10 MG (21) TBPK tablet Take as directed with food (Patient not taking: Reported on 04/28/2024)    No facility-administered encounter medications on file as of 04/28/2024.   Allergies  Allergen Reactions   Aspirin Other (See Comments)    Not actual allergy , had chronic nosebleeds as a child and was instructed not to use aspirin.    ROS: No f/c, body aches, swollen glands.  Allergies per HPI. No URI symptoms. Breathing is good, no CP or SOB. Rash and itching  per HPI.  Slight cough/PND. Improved after eliminating dairy creamer. Moods are good. No other concerns. See HPI    PHYSICAL EXAM:  BP 120/70   Pulse 68   Temp 97.8 F (36.6 C) (Tympanic)   Ht 5\' 10"  (1.778 m)   Wt 190 lb 9.6 oz (86.5 kg)   BMI 27.35 kg/m   Pleasant, well-appearing male, in no distress. Occasional throat-clearing during visit. No coughing or sniffling. HEENT: conjunctiva and sclera are clear, EOMI.   Nasal mucosa with some mild edema and clear mucus. Sinuses nontender. OP clear Neck: no lymphadenopathy, thyromegaly or mass Heart: regular rate and rhythm Lungs: clear bilaterally Skin:  diffuse area of maculopapular erythema at lateral torso bilaterally, and most of chest/abdomen.  1 lesion looked somewhat urticarial (more raised, larger) in skin fold below L chest. Back only scattered papules on upper and mid back, faint rash along the lower. Arms--very faint areas of maculopapular rash at anterior forearms, and dry at antecubital fossa There is some bruising on his sides, back (yellow-green) Extremities: There were a few papules at R ankle. Feet appear normal, no maceration between toes or any rash on feet.    ASSESSMENT/PLAN:  Rash - unclear etiology, Ddx reviewed--contact derm, viral, atypical appearance of PR, other. Some improvement. Cont supportive measures  Seasonal allergies - some nasal congestion and PND. Cont zyrtec daily. Consider f/u with allergist vs derm if rash or allergies not improving  He is scheduled to see derm for routine appt in July. He has f/u with Dr. Dione Franks (pulm) in August, but is on the cancellation list.  ? If he needs to restart Dupixent .  Discussed prednisone  and timing of him switching creamers--unsure if cough better related to prednisone , or if truly related to dairy.   I spent 35 minutes dedicated to the care of this patient, including pre-visit review of records, face to face time, post-visit ordering of testing and documentation.   Continue zyrtec daily, with benadryl at night (as needed). You can continue topical calamine lotion. You can try Sarna anti-itch lotion. You can use the 1% hydrocortisone as needed. (We discussed stronger steroids, but not to be over-used, and decided against it).. Continue cool compresses. Really try and avoid scratching/rubbing.  Shortness of breath, wheezing, fever, bleeding/bruising (not related to  rubbing/scratching) or other significant changes to the rash would be reasons for re-evaluation. I suspect this is either contact dermatitis, viral, we briefly discussed pityriasis rosea (based on what the flanks look like, but really doesn't have the classic appearance).

## 2024-04-28 NOTE — Patient Instructions (Signed)
 Continue zyrtec daily, with benadryl at night (as needed). You can continue topical calamine lotion. You can try Sarna anti-itch lotion. You can use the 1% hydrocortisone as needed. (We discussed stronger steroids, but not to be over-used, and decided against it).. Continue cool compresses. Really try and avoid scratching/rubbing.  Shortness of breath, wheezing, fever, bleeding/bruising (not related to rubbing/scratching) or other significant changes to the rash would be reasons for re-evaluation. I suspect this is either contact dermatitis, viral, we briefly discussed pityriasis rosea (based on what the flanks look like, but really doesn't have the classic appearance).

## 2024-04-30 ENCOUNTER — Encounter: Payer: Self-pay | Admitting: Internal Medicine

## 2024-05-01 ENCOUNTER — Ambulatory Visit: Admitting: Family Medicine

## 2024-05-02 ENCOUNTER — Ambulatory Visit: Payer: Self-pay | Admitting: Internal Medicine

## 2024-05-02 NOTE — Telephone Encounter (Signed)
 FYI Only or Action Required?: Action required by provider  Patient is followed in Pulmonology for asthma, allergic rhinitis, and lung nodule, last seen on 10/24/2023 by Seth Hurdle, MD. Called Nurse Triage reporting Rash, Pruritis, and medication question. Symptoms began several weeks ago. Interventions attempted: OTC medications: calamine lotion, anti-itch, Prescription medications: prednisone , and Other: PCP exam and treatment. Symptoms are: not improving, rash continually moving around.  Triage Disposition: See Physician Within 24 Hours  Patient/caregiver understands and will follow disposition?: Yes - No pulm availability, advised PCP again or UC in meantime, requesting bloodwork with pulm      Copied from CRM [id number]. Topic: Clinical - Red Word Triage >> May 02, 2024  9:29 AM Seth Kerr wrote: Red Word that prompted transfer to Nurse Triage: Rash with severe itching. Trying to see if can testing for levels for dupixent  since off dupixent  since December Seen his PCP on 6/9 Cough Reason for Disposition  SEVERE itching (i.e., interferes with sleep, normal activities or school)  Answer Assessment - Initial Assessment Questions 1. APPEARANCE of RASH: Describe the rash. (e.g., spots, blisters, raised areas, skin peeling, scaly)     More spots, started in underarm spread down sides and stomach area, more on back now, can see small tiny bumps, can feel them, red spots, no blisters or skin peeling, not dry or scaly 2. SIZE: How big are the spots? (e.g., tip of pen, eraser, coin; inches, centimeters)     More like tip of pen, last week or so definitely bigger going down sides, gotten smaller since then 3. LOCATION: Where is the rash located?     Just the upper body, may be some spots on legs that may also be bug bites, eczema-like rash on arm 4. COLOR: What color is the rash? (Note: It is difficult to assess rash color in people with darker-colored skin. When this situation occurs,  simply ask the caller to describe what they see.)     Red spots everywhere no redness between 5. ONSET: When did the rash begin?     Few weeks ago this may be week 3 6. FEVER: Do you have a fever? If Yes, ask: What is your temperature, how was it measured, and when did it start?     No fever 7. ITCHING: Does the rash itch? If Yes, ask: How bad is the itch? (Scale 1-10; or mild, moderate, severe)     8-10/10 at times, itchy irritation, trying calamine lotion, ice pack, cold showers, right now not put anything on today maybe need to stop putting stuff on right now 5-6, but taken zytrec and montelukast  9. MEDICINE FACTORS: Have you started any new medicines within the last 2 weeks? (e.g., antibiotics)      no 10. OTHER SYMPTOMS: Do you have any other symptoms? (e.g., dizziness, headache, sore throat, joint pain)       No dizziness, headache, sore throat, joint pain No worsening SOB, coughing, or wheezing, chest pain No swelling Had some bruising a bit ago on lower back few days ago not sure if related, some bruising on leg right now, could have hit it A month ago sick and had steroids then did another round of steroids 1.5 weeks ago, talked with her about rash, video visit because not in town, did rash remedies topical and such as well, saw PCP this week, wasn't sure about rash Maybe my counts are up again with dupixent , maybe antihistamine high levels or something Want to get in today even  just to get blood levels drawn  No pulm availability, advised UC or calling PCP again, pt requesting blood work and quick look by Dr. Dione Kerr today if possible, advised that no guarantee since no appts today but could try with PCP and requesting blood work, sending message for call back to pt with further recommendations/appt options, call back if worsening or new symptoms  Protocols used: Rash or Redness - [facility]

## 2024-05-02 NOTE — Telephone Encounter (Signed)
 Action Needed - Needs exam today, no pulm availability, pt requesting blood work and call back

## 2024-05-02 NOTE — Telephone Encounter (Signed)
 I do not manage rashes and would not feel comfortable starting a patient on dupixent  for rashes. Recommend if not improving to see dermatology if it has escalated beyond PCP

## 2024-05-05 NOTE — Telephone Encounter (Signed)
 ATC patient x3.  No answer.  LVM to return call.  Closing encounter per policy.

## 2024-05-05 NOTE — Telephone Encounter (Signed)
ATC x1 LVM for patient to call our office back regarding prior message.  

## 2024-05-05 NOTE — Telephone Encounter (Signed)
 ATC X2. LMTCB

## 2024-05-14 ENCOUNTER — Telehealth: Payer: Self-pay | Admitting: *Deleted

## 2024-05-14 NOTE — Telephone Encounter (Signed)
 Copied from CRM [id number]. Topic: Clinical - Medical Advice >> May 06, 2024  9:22 AM Joesph PARAS wrote: Reason for CRM: Patient is calling back for [facility]. Informed patient of Dr. Correne note. Patient inquired about whom dermatologist would be. Attempted to tell patient it would depend on location, timeframe, and insurance coverage regarding who he could see. Patient line went silent. No response. Ended call - dropped.   ATC x1.  LVM to return call.

## 2024-05-20 NOTE — Telephone Encounter (Signed)
 Looks like pt had a derm referral placed in january

## 2024-05-20 NOTE — Telephone Encounter (Signed)
 Patient was sent to [facility] by pt's family medicine. I have provided the patient with dermatologist # and informed the patient to call family medicine for more information on the referral in voicemail.

## 2024-07-09 ENCOUNTER — Ambulatory Visit: Payer: Self-pay | Admitting: Internal Medicine

## 2024-07-10 ENCOUNTER — Encounter: Payer: Self-pay | Admitting: Family Medicine

## 2024-07-10 DIAGNOSIS — R21 Rash and other nonspecific skin eruption: Secondary | ICD-10-CM

## 2024-07-10 NOTE — Telephone Encounter (Signed)
 Schedule nonfasting lab visit, if pt wants. I put in order

## 2024-07-14 ENCOUNTER — Other Ambulatory Visit

## 2024-07-14 DIAGNOSIS — R21 Rash and other nonspecific skin eruption: Secondary | ICD-10-CM

## 2024-07-17 LAB — ALPHA-GAL PANEL
Allergen Lamb IgE: <0.1 kU/L
Beef IgE: <0.1 kU/L
IgE (Immunoglobulin E), Serum: 12 [IU]/mL (ref 6–495)
O215-IgE Alpha-Gal: <0.1 kU/L
Pork IgE: <0.1 kU/L

## 2024-07-21 ENCOUNTER — Ambulatory Visit: Payer: Self-pay | Admitting: Family Medicine

## 2024-09-05 ENCOUNTER — Encounter: Payer: Self-pay | Admitting: Family Medicine

## 2024-09-05 ENCOUNTER — Telehealth: Admitting: Medical

## 2024-09-05 VITALS — Wt 190.0 lb

## 2024-09-05 DIAGNOSIS — J309 Allergic rhinitis, unspecified: Secondary | ICD-10-CM

## 2024-09-05 DIAGNOSIS — N529 Male erectile dysfunction, unspecified: Secondary | ICD-10-CM

## 2024-09-05 DIAGNOSIS — J4550 Severe persistent asthma, uncomplicated: Secondary | ICD-10-CM

## 2024-09-05 DIAGNOSIS — R051 Acute cough: Secondary | ICD-10-CM

## 2024-09-05 MED ORDER — PREDNISONE 10 MG PO TABS: ORAL_TABLET | ORAL | 0 refills | Status: DC

## 2024-09-05 MED ORDER — BENZONATATE 200 MG PO CAPS: 200.0000 mg | ORAL_CAPSULE | Freq: Two times a day (BID) | ORAL | 0 refills | Status: DC | PRN

## 2024-09-05 NOTE — Progress Notes (Signed)
 Subjective:     Patient ID: Seth Kerr, male   DOB: 1971-07-18, 53 y.o.   MRN: [id number]  This visit type was conducted due to national recommendations for restrictions regarding the COVID-19 Pandemic (e.g. social distancing) in an effort to limit this patient's exposure and mitigate transmission in our community.  Due to their co-morbid illnesses, this patient is at least at moderate risk for complications without adequate follow up.  This format is felt to be most appropriate for this patient at this time.    Documentation for virtual audio and video telecommunications through [facility] encounter:  The patient was located at home. The provider was located in the office. The patient did consent to this visit and is aware of possible charges through their insurance for this visit.  The other persons participating in this telemedicine service were none. Time spent on call was 20 minutes and in review of previous records 20 minutes total.  This virtual service is not related to other E/M service within previous 7 days.   HPI Chief Complaint  Patient presents with   Acute Visit    Cough, symptoms started end of September. Every year he gets this and tries to wait it out but has to get a steroid pack to clear up. No other symptoms   Virtual for cough, bad cough.  Having cough fits.  Every October gets similar symptoms with weather change and time of year.  Has been using breathing treatments, emergency inhalers, allergy  medication.  Typically will get prescribed a steroid taper that resolves symptoms.  No sore throat , no ear pain, no fever, no body aches or chills, no NVD.  Has had some mucus in throat.   No wheeze or SOB.  No gerd, heartburn, acid reflux.    Takes year round singular and allegra.  Using Symbicort  2 puffs BID in general.  Uses Symbicort  year round.    No other aggravating or relieving factors. No other complaint.   Past Medical History:  Diagnosis Date    Allergic rhinitis, cause unspecified    immunotherapy x 34yrs (stopped end of 2015), per Dr. Frutoso   Asthma    COVID-19 virus infection 09/29/2019   Erectile dysfunction    Irritable bowel syndrome (IBS)    has had 2 colonoscopies   Melanoma (HCC) 01/11/2022   right forearm posterior   Onychomycosis    s/p lamisil  x 2 courses   Current Outpatient Medications on File Prior to Visit  Medication Sig Dispense Refill   albuterol  (PROVENTIL ) (2.5 MG/3ML) 0.083% nebulizer solution INHALE 3 ML BY NEBULIZATION EVERY 6 HOURS AS NEEDED FOR WHEEZING OR SHORTNESS OF BREATH 75 mL 4   albuterol  (VENTOLIN  HFA) 108 (90 Base) MCG/ACT inhaler INHALE 2 PUFFS INTO THE LUNGS EVERY 6 HOURS AS NEEDED FOR WHEEZING/SHORTNESS OF BREATH 8.5 each 5   budesonide -formoterol  (SYMBICORT ) 160-4.5 MCG/ACT inhaler Inhale 2 puffs into the lungs 2 (two) times daily. 30.6 g 11   cetirizine (ZYRTEC) 10 MG tablet Take 10 mg by mouth daily.     diphenhydrAMINE (BENADRYL) 25 MG tablet Take 25 mg by mouth every 6 (six) hours as needed.     montelukast  (SINGULAIR ) 10 MG tablet TAKE 1 TABLET BY MOUTH EVERYDAY AT BEDTIME 90 tablet 4   Multiple Vitamins-Minerals (MULTIVITAMIN MEN PO) Take 1 capsule by mouth daily.     Semaglutide,0.25 or 0.5MG /DOS, 2 MG/1.5ML SOPN Inject 0.5 mg into the skin once a week.     sildenafil  (VIAGRA ) 100 MG tablet Take  0.5-1 tablets (50-100 mg total) by mouth daily as needed for erectile dysfunction. 10 tablet 11   calamine lotion Apply 1 Application topically as needed for itching.     No current facility-administered medications on file prior to visit.    Review of Systems As in subjective    Objective:   Physical Exam Due to coronavirus pandemic stay at home measures, patient visit was virtual and they were not examined in person.   Wt 190 lb (86.2 kg)   BMI 27.26 kg/m   Gen:wd, wn, nad No wheezing or labored breathing      Assessment:     Encounter Diagnoses  Name Primary?   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 Consider reflux-induced cough.  Symptoms persist despite him already using Advair  treatments, Symbicort , albuterol , and his regular allergy  medications. - Prescribed prednisone  taper - Prescribed Tessalon  Perles for cough relief as needed. - Advised to monitor symptoms and contact office if no improvement by Tuesday or Wednesday. - Plan chest x-ray if cough persists for two weeks despite medication.   Seth Kerr was seen today for acute visit.  Diagnoses and all orders for this visit:  Acute cough  Allergic rhinitis, unspecified seasonality, unspecified trigger  Erectile dysfunction, unspecified erectile dysfunction type  Severe persistent asthma without complication (HCC)    F/u prn

## 2024-09-29 ENCOUNTER — Encounter: Payer: Self-pay | Admitting: Internal Medicine

## 2024-09-29 ENCOUNTER — Ambulatory Visit (INDEPENDENT_AMBULATORY_CARE_PROVIDER_SITE_OTHER): Payer: Self-pay | Admitting: Internal Medicine

## 2024-09-29 VITALS — BP 130/89 | HR 61 | Temp 98.5°F | Ht 71.0 in | Wt 198.2 lb

## 2024-09-29 DIAGNOSIS — J4540 Moderate persistent asthma, uncomplicated: Secondary | ICD-10-CM | POA: Diagnosis not present

## 2024-09-29 DIAGNOSIS — J301 Allergic rhinitis due to pollen: Secondary | ICD-10-CM

## 2024-09-29 MED ORDER — WIXELA INHUB 500-50 MCG/ACT IN AEPB: 1.0000 | INHALATION_SPRAY | Freq: Two times a day (BID) | RESPIRATORY_TRACT | 11 refills | Status: AC

## 2024-09-29 MED ORDER — AZELASTINE HCL 0.1 % NA SOLN: 1.0000 | Freq: Two times a day (BID) | NASAL | 12 refills | Status: AC

## 2024-09-29 NOTE — Patient Instructions (Signed)
 It was a pleasure to see you today!  Please schedule follow up with Dr. Kara in 3 months.  If my schedule is not open yet, we will contact you with a reminder closer to that time. Please call [phone] if you haven't heard from us  a month before, and always call us  sooner if issues or concerns arise. You can also send us  a message through MyChart, but but aware that this is not to be used for urgent issues and it may take up to 5-7 days to receive a reply. Please be aware that you will likely be able to view your results before I have a chance to respond to them. Please give us  5 business days to respond to any non-urgent results.    YOUR PLAN: -POORLY CONTROLLED MODERATE PERSISTENT ASTHMA: This means your asthma symptoms are not well-managed and have been getting worse, requiring more frequent use of prednisone . We have changed your Symbicort  to wixela one puff in the morning and one puff at night, and you should gargle after each use. Continue taking montelukast  at night. If your symptoms do not improve, we may consider injectable asthma medications in the future. continue albuterol  inhaler as needed.   -ALLERGIC RHINITIS: This condition involves inflammation inside your nose due to allergies, which can worsen your asthma. We have switched your antihistamine from Allegra to cetirizine. Additionally, we recommend using Astelin nasal spray, one spray in each nostril, and continuing montelukast .  INSTRUCTIONS: Please follow up as needed if your symptoms do not improve or if you experience any side effects from the new medications. Continue to monitor your symptoms and use the medications as directed. If you have any questions or concerns, do not hesitate to contact our office.

## 2024-09-29 NOTE — Progress Notes (Signed)
 Seth Kerr    [id number]    01/08/1971  Primary Care Physician:Knapp, Annabelle, MD Date of Appointment: 09/29/2024 Established Patient Visit  Chief complaint:   Chief Complaint  Patient presents with   Medical Management of Chronic Issues    Meds and wants blood work done      HPI: Seth Kerr is a 53 y.o. man with severe persistent asthma on dupixent  started Fall 2021. He discontinued in Dec 2024.   Interval Updates: Discussed the use of AI scribe software for clinical note transcription with the patient, who gave verbal consent to proceed.  History of Present Illness Seth Kerr is a 53 year old male with asthma who presents with worsening asthma control.  His asthma symptoms have been worsening during seasonal changes, particularly in October. He has required prednisone  three times in the past year to manage exacerbations. He previously discontinued Dupixent  over a year ago and is currently using Symbicort  inhaler (two puffs twice daily) and Singulair  (montelukast ) at night. He occasionally uses a flonase nasal spray and had been taking Allegra daily until a few weeks ago.  In June, he experienced a rash on his upper body that persisted until mid-July, despite taking multiple antihistamines including Allegra and Zyrtec. The cause of the rash was not determined, but it has since resolved. He occasionally experiences itching and uses a prescribed cream for relief.Had some contact dermatitis that over the summer that seems to be responding to topical steroids.   In January or February, he was told by Dr. Sharen that his eosinophil levels were elevated. He associates this with his asthma and previous use of Dupixent . He is concerned about returning to the level of symptoms he experienced before starting Dupixent , which included frequent coughing and significant discomfort. He does not want to go on injectable medications at this time because he wants to limit the medications he  takes.   No reflux or heartburn. Was using allegra previously, no anti-histamines recently.   Current Regimen: symbicort  2 puffs BID,  albuterol  prn, minimal use.  Asthma Triggers: environmental allergies Exacerbations in the last year: 3 times in the last year.  History of hospitalization or intubation: Allergy  Testing: has had SCIT in the past with Dr. Frutoso.  GERD: denies Allergic Rhinitis: montelukast , no anti-histamine ACT:  Asthma Control Test ACT Total Score  05/31/2022  3:22 PM 20  09/21/2021  3:16 PM 22  08/10/2021  2:06 PM 12   FeNO: 33 ppb in September 2022, 7 ppb today   I have reviewed the patient's family social and past medical history and updated as appropriate.   Past Medical History:  Diagnosis Date   Allergic rhinitis, cause unspecified    immunotherapy x 59yrs (stopped end of 2015), per Dr. Frutoso   Asthma    COVID-19 virus infection 09/29/2019   Erectile dysfunction    Irritable bowel syndrome (IBS)    has had 2 colonoscopies   Melanoma (HCC) 01/11/2022   right forearm posterior   Onychomycosis    s/p lamisil  x 2 courses    Past Surgical History:  Procedure Laterality Date   COLONOSCOPY  last 2012   in [place] Normal exam per pt   excision of melanoma Right 2023   forearm   SEPTOPLASTY  approx age 62   SHOULDER SURGERY Right    TONSILLECTOMY  age 82   VASECTOMY      Family History  Problem Relation Age of Onset   Cancer  Mother        lung cancer, smoker   Alcohol abuse Mother    Cancer Father        stomach cancer, smoker   Alcohol abuse Father    Stomach cancer Father    Asthma Daughter    Diabetes Neg Hx    Heart disease Neg Hx    Esophageal cancer Neg Hx    Rectal cancer Neg Hx    Colon cancer Neg Hx    Colon polyps Neg Hx     Social History   Occupational History   Occupation: Corporate Treasurer at [employer]    Employer: [employer]  Tobacco Use   Smoking status: Never   Smokeless tobacco:  Never  Vaping Use   Vaping status: Never Used  Substance and Sexual Activity   Alcohol use: Yes    Alcohol/week: 15.0 standard drinks of alcohol    Types: 15 Standard drinks or equivalent per week    Comment: 2 glasses of wine (or vodka soda) 7x/week;   Drug use: No   Sexual activity: Yes    Partners: Female     Physical Exam: Blood pressure 130/89, pulse 61, temperature 98.5 F (36.9 C), temperature source Oral, height 5' 11 (1.803 m), weight 198 lb 3.2 oz (89.9 kg), SpO2 95%.  Gen:      No distress Lungs:    ctab no wheezes or crackles CV:        RRR no mrg   Data Reviewed: Imaging: I have personally reviewed the chest xray 07/2021 no acute process, mild peribronchial thickening.  PFTs:     Latest Ref Rng & Units 09/21/2021    2:01 PM 06/15/2016   12:49 PM  PFT Results  FVC-Pre L 4.27  4.26   FVC-Predicted Pre % 84  82   FVC-Post L 4.43  4.54   FVC-Predicted Post % 88  88   Pre FEV1/FVC % % 78  80   Post FEV1/FCV % % 82  83   FEV1-Pre L 3.34  3.42   FEV1-Predicted Pre % 85  84   FEV1-Post L 3.62  3.76   DLCO uncorrected ml/min/mmHg 36.28  41.88   DLCO UNC% % 124  129   DLCO corrected ml/min/mmHg 36.28    DLCO COR %Predicted % 124    DLVA Predicted % 133  153   TLC L 6.02  6.27   TLC % Predicted % 86  90   RV % Predicted % 82  96    I have personally reviewed the patient's PFTs and today showed normal pulmonary function.  Labs: IgE 7 in September 2022 Elevated eosinophils in the past 1254   Immunization status: Immunization History  Administered Date(s) Administered   Influenza Split 09/12/2012, 08/29/2014, 09/05/2016   Influenza, Seasonal, Injecte, Preservative Fre 09/07/2023   Influenza,inj,Quad PF,6+ Mos 10/09/2013, 09/03/2015, 08/29/2017, 08/28/2018, 08/25/2019, 08/18/2020, 09/21/2021, 11/27/2022   PFIZER Comirnaty(Gray Top)Covid-19 Tri-Sucrose Vaccine 05/12/2021   PFIZER(Purple Top)SARS-COV-2 Vaccination 01/21/2020, 02/11/2020   Pneumococcal  Polysaccharide-23 05/05/2015   Tdap 10/04/2010, 04/22/2014, 12/05/2023   Zoster Recombinant(Shingrix ) 12/05/2023, 03/03/2024    Assessment and Plan Assessment & Plan Poorly controlled moderate persistent asthma Asthma control worsened over the past year with increased prednisone  use. Symptoms exacerbated by seasonal changes. Elevated eosinophils noted but not primary in treatment decisions. Discussed risks of recurrent prednisone  use. Injectable medications have long-term safety and efficacy. Inhaled steroids have fewer side effects than oral steroids. - Increased Symbicort  to one puff  in the morning and one puff at night, gargle after use. - Continue montelukast  at night. - Discussed potential use of injectable asthma medications if symptoms do not improve.  Allergic rhinitis Contributing to asthma exacerbations. Discussed switching to cetirizine due to decreased efficacy of long-term antihistamine use. Consideration of nasal sprays for localized treatment. - Switched from Allegra to cetirizine. - Recommended Astelin nasal spray, one spray each side of the nose, with proper technique. - Continue montelukast  and consider adding nasal sprays for better allergy  control.  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 If he is interested in SCIT again.   Return to Care: Return in about 3 months (around 12/30/2024) for Dr. Kara.   Verdon Gore, MD Pulmonary and Critical Care Medicine [facility] [phone]

## 2024-10-15 ENCOUNTER — Encounter: Payer: Self-pay | Admitting: Family Medicine

## 2024-10-15 ENCOUNTER — Other Ambulatory Visit

## 2024-10-15 DIAGNOSIS — Z23 Encounter for immunization: Secondary | ICD-10-CM | POA: Diagnosis not present

## 2024-10-15 NOTE — Progress Notes (Unsigned)
 Patient is in office today for a nurse visit for Immunization. Patient Injection was given in the  Left deltoid. Patient tolerated injection well.

## 2024-11-30 ENCOUNTER — Encounter: Payer: Self-pay | Admitting: *Deleted

## 2024-11-30 NOTE — Progress Notes (Unsigned)
 " No chief complaint on file.  Seth Kerr is a 54 y.o. male who presents for a complete physical.    He was seen in June with a rash.  He saw dermatology. The rash persisted until mid-July, but eventually resolved, with no recurrences. ***UPDATE   He started semaglutide through [facility] (compounding pharmacy) in April 2024.  He was >200# prior to starting, got down to 173# in 06/2023.  He took a break in October 2024 when in [place], but resumed it upon returning.  At his physical last year, on the medication, he had reported staying around 185# recently. He found the medication decreases his appetite, he was having smaller portions. We had discussed ways to taper/stop.  ***UPDATE  Wt Readings from Last 3 Encounters:  09/29/24 198 lb 3.2 oz (89.9 kg)  09/05/24 190 lb (86.2 kg)  04/28/24 190 lb 9.6 oz (86.5 kg)    Asthma and eosinophilia with elevated IgE levels: He remains under the care of Pulmonary.  He had gone off Dupixent  in 10/2023. He did well initially, but had asthma exacerbations related to season changes, and had required prednisone  to treat exaerbations (3x in the year, per Dr. Correne note).  He last saw Dr. Meade in November 2025. He was advised to continue on inhaled steroid (Symbicort  BID) and montelukast . Since allergies were contributing, he recommended switching to zyrtec, continue montelukast , and added Astelin  nasal spray, with recommendation to consider adding nasal steroid for better allergy  control.   He spends a lot more time at [facility], and allergies are better there. He previously saw Dr. Frutoso and got immunotherapy.  He remains very active with running, biking, and hasn't had any problems related to exercise.    ED comes and goes. Has used Viagra  in the past with good results.    Immunization History  Administered Date(s) Administered   Influenza Split 09/12/2012, 08/29/2014, 09/05/2016   Influenza, Seasonal, Injecte, Preservative Fre 09/07/2023,  10/15/2024   Influenza,inj,Quad PF,6+ Mos 10/09/2013, 09/03/2015, 08/29/2017, 08/28/2018, 08/25/2019, 08/18/2020, 09/21/2021, 11/27/2022   PFIZER Comirnaty(Gray Top)Covid-19 Tri-Sucrose Vaccine 05/12/2021   PFIZER(Purple Top)SARS-COV-2 Vaccination 01/21/2020, 02/11/2020   Pneumococcal Polysaccharide-23 05/05/2015   Tdap 10/04/2010, 04/22/2014, 12/05/2023   Zoster Recombinant(Shingrix ) 12/05/2023, 03/03/2024  Declined bivalent COVID booster Last colonoscopy: 01/2021 with Dr. Eda, normal. Last PSA:  Lab Results  Component Value Date   PSA1 1.4 12/05/2023   PSA1 1.5 11/27/2022   PSA1 1.3 11/24/2021  Dentist: twice yearly Optho: every 2 years, wears glasses. Due.  Notices worsening vision (reading only) Exercise:   Cardio 5 days/week (running (mostly), biking (at the gym) and swimming when training for triathlons, not recently). Weights 5x/week (joined [employer] on [address] Goal is to compete in a full ironman (  Lipids: Lab Results  Component Value Date   CHOL 224 (H) 12/05/2023   HDL 109 12/05/2023   LDLCALC 104 (H) 12/05/2023   TRIG 61 12/05/2023   CHOLHDL 2.1 12/05/2023  Denies changes in diet, other than eating less due to being on semaglutide.  Vitamin D  normal at 45 in 2015 Normal thyroid screen 2015   PMH, PSH, SH and FH were reviewed and updated    ROS:  The patient denies anorexia, headaches, vision loss, decreased hearing, ear pain, hoarseness, palpitations, dizziness, syncope, dyspnea on exertion, cough, swelling, nausea, vomiting, diarrhea, constipation, abdominal pain, melena, hematochezia, indigestion/heartburn, hematuria, incontinence, numbness, tingling, weakness, tremor, suspicious skin lesions, depression, anxiety, abnormal bleeding/bruising, or enlarged lymph nodes. Some urinary urgency, intermittent.  Only rarely  has issues completely emptying bladder. ED intermittently, meds are effective. Normal libido. Asthma/allergies are controlled. Weight  loss d/t semaglutide, per HPI Sore back currently, mild   PHYSICAL EXAM:  There were no vitals taken for this visit.  Wt Readings from Last 3 Encounters:  09/29/24 198 lb 3.2 oz (89.9 kg)  09/05/24 190 lb (86.2 kg)  04/28/24 190 lb 9.6 oz (86.5 kg)  11/24/21  209# 3.2 oz 11/11/20 194# 12.8 oz  General Appearance:    Alert, cooperative, no distress, appears stated age    Head:      Normocephalic, without obvious abnormality, atraumatic    Eyes:      PERRL, conjunctiva/corneas clear, EOM's intact, fundi benign    Ears:      Normal TM's and external ear canals    Nose:     No drainage or sinus tenderness  Throat:     Normal mucosa  Neck:     Supple, no lymphadenopathy;  thyroid:  no enlargement/tenderness/ nodules; no carotid bruit or JVD    Back:      Spine nontender, no curvature, ROM normal, no CVA tenderness    Lungs:      Respirations unlabored, good air movement. No wheezing, rales, ronchi   Chest Wall:      No tenderness or deformity     Heart:      Regular rate and rhythm, S1 and S2 normal, no murmur, rub or gallop    Breast Exam:      No chest wall tenderness, masses or gynecomastia    Abdomen:       Soft, non-tender, nondistended, normoactive bowel sounds, no masses, no hepatosplenomegaly    Genitalia:      Normal male external genitalia without lesions.  Testicles without masses.  No inguinal hernias.    Rectal:      Normal sphincter tone, no masses or tenderness; heme negative stool.  Prostate smooth, no nodules, not enlarged.    Extremities:     No clubbing, cyanosis or edema.  No rashes or suspicious lesions.  Pulses:     2+ and symmetric all extremities    Skin:     Skin color, texture, turgor normal.    Lymph nodes:     Cervical, supraclavicular, and inguinal nodes normal    Neurologic:     Normal strength, sensation and gait; reflexes 2+ and symmetric throughout                             Psych:   Normal mood, affect, hygiene and  grooming    ASSESSMENT/PLAN:  Prevnar-20  ?taking semaglutide?  PSA, Technically doesn't need full panel, labs fine last year. Can do c-met, cbc if desired. Can also do lipids--was high 2 years ago, great last year, but was on semaglutide. No needed this year if still on semaglutide with unchanged diet.   Discussed PSA screening (risks/benefits); recommended at least 30 minutes of aerobic activity at least 5 days/week, weight-bearing exercise at least 2x/week; proper sunscreen use reviewed; healthy diet and alcohol recommendations (less than or equal to 2 drinks/day) reviewed; regular seatbelt use; changing batteries in smoke detectors, carbon monoxide detectors ecommended. Immunization recommendations discussed--continue yearly flu shots.  Prevnar-20 *** COVID bivalent booster recommended, declined.   Colonoscopy recommendations reviewed--UTD   F/u 1 year for CPE, sooner prn.  "

## 2024-11-30 NOTE — Patient Instructions (Incomplete)

## 2024-12-01 ENCOUNTER — Ambulatory Visit: Payer: 59 | Admitting: Family Medicine

## 2024-12-01 ENCOUNTER — Encounter: Payer: Self-pay | Admitting: Family Medicine

## 2024-12-01 VITALS — BP 128/84 | HR 64 | Ht 70.0 in | Wt 199.2 lb

## 2024-12-01 DIAGNOSIS — J302 Other seasonal allergic rhinitis: Secondary | ICD-10-CM | POA: Diagnosis not present

## 2024-12-01 DIAGNOSIS — J4550 Severe persistent asthma, uncomplicated: Secondary | ICD-10-CM | POA: Diagnosis not present

## 2024-12-01 DIAGNOSIS — Z125 Encounter for screening for malignant neoplasm of prostate: Secondary | ICD-10-CM

## 2024-12-01 DIAGNOSIS — Z23 Encounter for immunization: Secondary | ICD-10-CM | POA: Diagnosis not present

## 2024-12-01 DIAGNOSIS — N529 Male erectile dysfunction, unspecified: Secondary | ICD-10-CM

## 2024-12-01 DIAGNOSIS — Z0000 Encounter for general adult medical examination without abnormal findings: Secondary | ICD-10-CM | POA: Diagnosis not present

## 2024-12-01 LAB — CBC WITH DIFFERENTIAL/PLATELET
Basophils Absolute: 0.1 x10E3/uL (ref 0.0–0.2)
Basos: 1 %
EOS (ABSOLUTE): 0.5 x10E3/uL — ABNORMAL HIGH (ref 0.0–0.4)
Eos: 10 %
Hematocrit: 47.2 % (ref 37.5–51.0)
Hemoglobin: 15.9 g/dL (ref 13.0–17.7)
Immature Grans (Abs): 0 x10E3/uL (ref 0.0–0.1)
Immature Granulocytes: 0 %
Lymphocytes Absolute: 1.9 x10E3/uL (ref 0.7–3.1)
Lymphs: 40 %
MCH: 30.5 pg (ref 26.6–33.0)
MCHC: 33.7 g/dL (ref 31.5–35.7)
MCV: 91 fL (ref 79–97)
Monocytes Absolute: 0.8 x10E3/uL (ref 0.1–0.9)
Monocytes: 16 %
Neutrophils Absolute: 1.6 x10E3/uL (ref 1.4–7.0)
Neutrophils: 33 %
Platelets: 293 x10E3/uL (ref 150–450)
RBC: 5.21 x10E6/uL (ref 4.14–5.80)
RDW: 12.4 % (ref 11.6–15.4)
WBC: 4.9 x10E3/uL (ref 3.4–10.8)

## 2024-12-01 LAB — CMP14+EGFR
ALT: 68 IU/L — ABNORMAL HIGH (ref 0–44)
AST: 36 IU/L (ref 0–40)
Albumin: 4.6 g/dL (ref 3.8–4.9)
Alkaline Phosphatase: 53 IU/L (ref 47–123)
BUN/Creatinine Ratio: 9 (ref 9–20)
BUN: 9 mg/dL (ref 6–24)
Bilirubin Total: 0.7 mg/dL (ref 0.0–1.2)
CO2: 26 mmol/L (ref 20–29)
Calcium: 9.7 mg/dL (ref 8.7–10.2)
Chloride: 103 mmol/L (ref 96–106)
Creatinine, Ser: 0.97 mg/dL (ref 0.76–1.27)
Globulin, Total: 2.8 g/dL (ref 1.5–4.5)
Glucose: 95 mg/dL (ref 70–99)
Potassium: 4.6 mmol/L (ref 3.5–5.2)
Sodium: 144 mmol/L (ref 134–144)
Total Protein: 7.4 g/dL (ref 6.0–8.5)
eGFR: 93 mL/min/1.73

## 2024-12-01 LAB — PSA: Prostate Specific Ag, Serum: 2.5 ng/mL (ref 0.0–4.0)

## 2024-12-01 LAB — LIPID PANEL
Chol/HDL Ratio: 2.8 ratio (ref 0.0–5.0)
Cholesterol, Total: 235 mg/dL — ABNORMAL HIGH (ref 100–199)
HDL: 85 mg/dL
LDL Chol Calc (NIH): 132 mg/dL — ABNORMAL HIGH (ref 0–99)
Triglycerides: 105 mg/dL (ref 0–149)
VLDL Cholesterol Cal: 18 mg/dL (ref 5–40)

## 2024-12-02 ENCOUNTER — Ambulatory Visit: Payer: Self-pay | Admitting: Family Medicine

## 2024-12-02 ENCOUNTER — Other Ambulatory Visit: Payer: Self-pay | Admitting: *Deleted

## 2024-12-02 DIAGNOSIS — R7989 Other specified abnormal findings of blood chemistry: Secondary | ICD-10-CM

## 2024-12-02 DIAGNOSIS — R972 Elevated prostate specific antigen [PSA]: Secondary | ICD-10-CM

## 2025-03-02 ENCOUNTER — Other Ambulatory Visit

## 2025-12-21 ENCOUNTER — Encounter: Admitting: Family Medicine
